# Patient Record
Sex: Female | Born: 1945 | Race: Black or African American | Hispanic: No | Smoking: Never smoker
Health system: Southern US, Community
[De-identification: ages and names within clinical notes are randomized; demographics above are authoritative.]

## PROBLEM LIST (undated history)

## (undated) DIAGNOSIS — Z9109 Other allergy status, other than to drugs and biological substances: Secondary | ICD-10-CM

## (undated) DIAGNOSIS — I219 Acute myocardial infarction, unspecified: Secondary | ICD-10-CM

## (undated) DIAGNOSIS — I493 Ventricular premature depolarization: Secondary | ICD-10-CM

## (undated) DIAGNOSIS — E7800 Pure hypercholesterolemia, unspecified: Secondary | ICD-10-CM

## (undated) DIAGNOSIS — J302 Other seasonal allergic rhinitis: Secondary | ICD-10-CM

## (undated) DIAGNOSIS — H409 Unspecified glaucoma: Secondary | ICD-10-CM

## (undated) DIAGNOSIS — I272 Pulmonary hypertension, unspecified: Secondary | ICD-10-CM

## (undated) DIAGNOSIS — C55 Malignant neoplasm of uterus, part unspecified: Secondary | ICD-10-CM

## (undated) DIAGNOSIS — R9430 Abnormal result of cardiovascular function study, unspecified: Secondary | ICD-10-CM

## (undated) DIAGNOSIS — J449 Chronic obstructive pulmonary disease, unspecified: Secondary | ICD-10-CM

## (undated) DIAGNOSIS — IMO0002 Reserved for concepts with insufficient information to code with codable children: Secondary | ICD-10-CM

## (undated) DIAGNOSIS — I10 Essential (primary) hypertension: Secondary | ICD-10-CM

## (undated) DIAGNOSIS — I639 Cerebral infarction, unspecified: Secondary | ICD-10-CM

## (undated) DIAGNOSIS — I5181 Takotsubo syndrome: Secondary | ICD-10-CM

## (undated) DIAGNOSIS — T7840XA Allergy, unspecified, initial encounter: Secondary | ICD-10-CM

## (undated) DIAGNOSIS — K219 Gastro-esophageal reflux disease without esophagitis: Secondary | ICD-10-CM

## (undated) DIAGNOSIS — H269 Unspecified cataract: Secondary | ICD-10-CM

## (undated) HISTORY — DX: Allergy, unspecified, initial encounter: T78.40XA

## (undated) HISTORY — DX: Unspecified cataract: H26.9

## (undated) HISTORY — DX: Abnormal result of cardiovascular function study, unspecified: R94.30

## (undated) HISTORY — DX: Unspecified glaucoma: H40.9

## (undated) HISTORY — DX: Chronic obstructive pulmonary disease, unspecified: J44.9

## (undated) HISTORY — DX: Reserved for concepts with insufficient information to code with codable children: IMO0002

---

## 1997-08-04 HISTORY — PX: ABDOMINAL HYSTERECTOMY: SHX81

## 1997-12-13 ENCOUNTER — Other Ambulatory Visit: Admission: RE | Admit: 1997-12-13 | Discharge: 1997-12-13 | Payer: Self-pay | Admitting: Obstetrics and Gynecology

## 1998-01-23 ENCOUNTER — Other Ambulatory Visit: Admission: RE | Admit: 1998-01-23 | Discharge: 1998-01-23 | Payer: Self-pay | Admitting: Obstetrics and Gynecology

## 1998-02-26 ENCOUNTER — Other Ambulatory Visit: Admission: RE | Admit: 1998-02-26 | Discharge: 1998-02-26 | Payer: Self-pay | Admitting: Obstetrics and Gynecology

## 1998-06-08 ENCOUNTER — Other Ambulatory Visit: Admission: RE | Admit: 1998-06-08 | Discharge: 1998-06-08 | Payer: Self-pay | Admitting: Obstetrics and Gynecology

## 1998-07-10 ENCOUNTER — Inpatient Hospital Stay (HOSPITAL_COMMUNITY): Admission: RE | Admit: 1998-07-10 | Discharge: 1998-07-13 | Payer: Self-pay | Admitting: Obstetrics and Gynecology

## 1998-11-13 ENCOUNTER — Other Ambulatory Visit: Admission: RE | Admit: 1998-11-13 | Discharge: 1998-11-13 | Payer: Self-pay | Admitting: Obstetrics and Gynecology

## 1999-02-07 ENCOUNTER — Other Ambulatory Visit: Admission: RE | Admit: 1999-02-07 | Discharge: 1999-02-07 | Payer: Self-pay | Admitting: Obstetrics and Gynecology

## 1999-05-07 ENCOUNTER — Other Ambulatory Visit: Admission: RE | Admit: 1999-05-07 | Discharge: 1999-05-07 | Payer: Self-pay | Admitting: Obstetrics and Gynecology

## 1999-08-13 ENCOUNTER — Other Ambulatory Visit: Admission: RE | Admit: 1999-08-13 | Discharge: 1999-08-13 | Payer: Self-pay | Admitting: Obstetrics and Gynecology

## 2000-01-06 ENCOUNTER — Other Ambulatory Visit: Admission: RE | Admit: 2000-01-06 | Discharge: 2000-01-06 | Payer: Self-pay | Admitting: Obstetrics and Gynecology

## 2000-07-08 ENCOUNTER — Other Ambulatory Visit: Admission: RE | Admit: 2000-07-08 | Discharge: 2000-07-08 | Payer: Self-pay | Admitting: Obstetrics and Gynecology

## 2001-02-01 ENCOUNTER — Other Ambulatory Visit: Admission: RE | Admit: 2001-02-01 | Discharge: 2001-02-01 | Payer: Self-pay | Admitting: Obstetrics and Gynecology

## 2001-08-06 ENCOUNTER — Other Ambulatory Visit: Admission: RE | Admit: 2001-08-06 | Discharge: 2001-08-06 | Payer: Self-pay | Admitting: Obstetrics and Gynecology

## 2002-01-18 ENCOUNTER — Other Ambulatory Visit: Admission: RE | Admit: 2002-01-18 | Discharge: 2002-01-18 | Payer: Self-pay | Admitting: Obstetrics and Gynecology

## 2002-08-18 ENCOUNTER — Other Ambulatory Visit: Admission: RE | Admit: 2002-08-18 | Discharge: 2002-08-18 | Payer: Self-pay | Admitting: Obstetrics and Gynecology

## 2003-10-16 ENCOUNTER — Other Ambulatory Visit: Admission: RE | Admit: 2003-10-16 | Discharge: 2003-10-16 | Payer: Self-pay | Admitting: Obstetrics and Gynecology

## 2004-04-05 ENCOUNTER — Ambulatory Visit: Payer: Self-pay | Admitting: Internal Medicine

## 2004-04-15 ENCOUNTER — Ambulatory Visit: Payer: Self-pay | Admitting: Family Medicine

## 2004-05-03 ENCOUNTER — Ambulatory Visit: Payer: Self-pay | Admitting: *Deleted

## 2004-05-28 ENCOUNTER — Ambulatory Visit: Payer: Self-pay | Admitting: Family Medicine

## 2004-09-27 ENCOUNTER — Ambulatory Visit: Payer: Self-pay | Admitting: Family Medicine

## 2004-10-01 ENCOUNTER — Ambulatory Visit (HOSPITAL_COMMUNITY): Admission: RE | Admit: 2004-10-01 | Discharge: 2004-10-01 | Payer: Self-pay | Admitting: Internal Medicine

## 2004-10-28 ENCOUNTER — Ambulatory Visit: Payer: Self-pay | Admitting: Internal Medicine

## 2005-03-10 ENCOUNTER — Ambulatory Visit: Payer: Self-pay | Admitting: Family Medicine

## 2005-04-09 ENCOUNTER — Ambulatory Visit: Payer: Self-pay | Admitting: Internal Medicine

## 2005-04-21 ENCOUNTER — Ambulatory Visit: Payer: Self-pay | Admitting: Family Medicine

## 2005-10-22 ENCOUNTER — Ambulatory Visit: Payer: Self-pay | Admitting: Family Medicine

## 2006-03-18 ENCOUNTER — Ambulatory Visit: Payer: Self-pay | Admitting: Family Medicine

## 2006-08-26 ENCOUNTER — Ambulatory Visit: Payer: Self-pay | Admitting: Family Medicine

## 2006-09-09 ENCOUNTER — Ambulatory Visit: Payer: Self-pay | Admitting: Family Medicine

## 2006-10-13 ENCOUNTER — Ambulatory Visit: Payer: Self-pay | Admitting: Family Medicine

## 2006-12-09 ENCOUNTER — Ambulatory Visit: Payer: Self-pay | Admitting: Family Medicine

## 2007-02-15 ENCOUNTER — Ambulatory Visit: Payer: Self-pay | Admitting: Family Medicine

## 2007-04-21 ENCOUNTER — Encounter (INDEPENDENT_AMBULATORY_CARE_PROVIDER_SITE_OTHER): Payer: Self-pay | Admitting: *Deleted

## 2007-07-27 ENCOUNTER — Ambulatory Visit: Payer: Self-pay | Admitting: Family Medicine

## 2007-07-27 ENCOUNTER — Encounter (INDEPENDENT_AMBULATORY_CARE_PROVIDER_SITE_OTHER): Payer: Self-pay | Admitting: Family Medicine

## 2007-07-27 LAB — CONVERTED CEMR LAB
Helicobacter Pylori Antibody-IgG: 2 — ABNORMAL HIGH
TSH: 3.664 microintl units/mL (ref 0.350–5.50)

## 2007-11-10 ENCOUNTER — Ambulatory Visit: Payer: Self-pay | Admitting: Internal Medicine

## 2007-12-14 ENCOUNTER — Emergency Department (HOSPITAL_COMMUNITY): Admission: EM | Admit: 2007-12-14 | Discharge: 2007-12-14 | Payer: Self-pay | Admitting: Emergency Medicine

## 2007-12-31 ENCOUNTER — Ambulatory Visit: Payer: Self-pay | Admitting: Internal Medicine

## 2008-01-02 ENCOUNTER — Emergency Department (HOSPITAL_COMMUNITY): Admission: EM | Admit: 2008-01-02 | Discharge: 2008-01-02 | Payer: Self-pay | Admitting: Emergency Medicine

## 2008-01-06 ENCOUNTER — Ambulatory Visit (HOSPITAL_COMMUNITY): Admission: RE | Admit: 2008-01-06 | Discharge: 2008-01-06 | Payer: Self-pay | Admitting: Family Medicine

## 2008-02-07 ENCOUNTER — Ambulatory Visit (HOSPITAL_COMMUNITY): Admission: RE | Admit: 2008-02-07 | Discharge: 2008-02-07 | Payer: Self-pay | Admitting: Gastroenterology

## 2008-03-06 ENCOUNTER — Ambulatory Visit: Payer: Self-pay | Admitting: Internal Medicine

## 2008-03-13 ENCOUNTER — Ambulatory Visit: Payer: Self-pay | Admitting: Internal Medicine

## 2008-03-13 LAB — CONVERTED CEMR LAB
Basophils Absolute: 0 10*3/uL (ref 0.0–0.1)
Basophils Relative: 1 % (ref 0–1)
Eosinophils Absolute: 0.5 10*3/uL (ref 0.0–0.7)
Eosinophils Relative: 6 % — ABNORMAL HIGH (ref 0–5)
HCT: 38.4 % (ref 36.0–46.0)
Hemoglobin: 12.5 g/dL (ref 12.0–15.0)
Lymphocytes Relative: 26 % (ref 12–46)
Lymphs Abs: 1.9 10*3/uL (ref 0.7–4.0)
MCHC: 32.6 g/dL (ref 30.0–36.0)
MCV: 89.5 fL (ref 78.0–100.0)
Monocytes Absolute: 1 10*3/uL (ref 0.1–1.0)
Monocytes Relative: 15 % — ABNORMAL HIGH (ref 3–12)
Neutro Abs: 3.7 10*3/uL (ref 1.7–7.7)
Neutrophils Relative %: 53 % (ref 43–77)
Platelets: 270 10*3/uL (ref 150–400)
RBC: 4.29 M/uL (ref 3.87–5.11)
RDW: 13.5 % (ref 11.5–15.5)
Rhuematoid fact SerPl-aCnc: <20 intl units/mL (ref 0–20)
Sed Rate: 25 mm/hr — ABNORMAL HIGH (ref 0–22)
Uric Acid, Serum: 5.2 mg/dL (ref 2.4–7.0)
WBC: 7.1 10*3/uL (ref 4.0–10.5)

## 2008-10-19 ENCOUNTER — Ambulatory Visit: Payer: Self-pay | Admitting: Internal Medicine

## 2008-12-20 IMAGING — RF DG BE W/ AIR HIGH DENSITY
15 of 18 series · 15 of 18 positions shown · IV contrast (agent unspecified)
Comparison: None

CLINICAL DATA: Change in stool.

AIR-CONTRAST BARIUM ENEMA
TECHNIQUE: After obtaining a scout radiograph air-contrast barium
enema was performed under fluoroscopy using high-density barium.
Contrast: High density barium and air

[Series 1: run · 1 of 1 slices shown (1 of 15)]
[im 1/1]
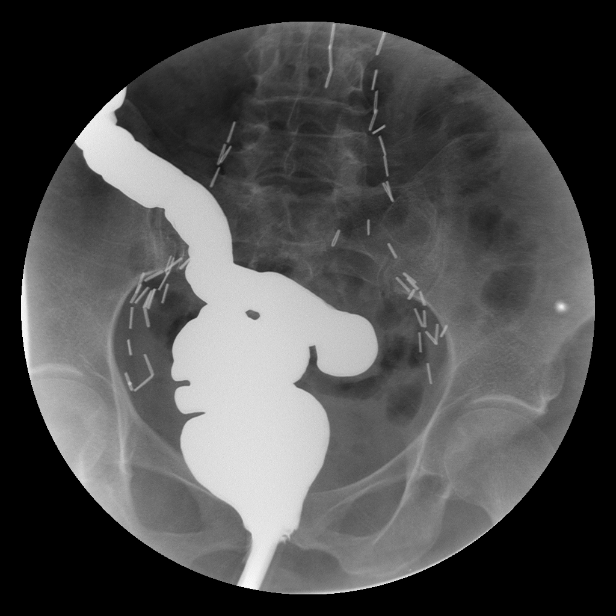

[Series 2: run · 1 of 1 slices shown (2 of 15)]
[im 1/1]
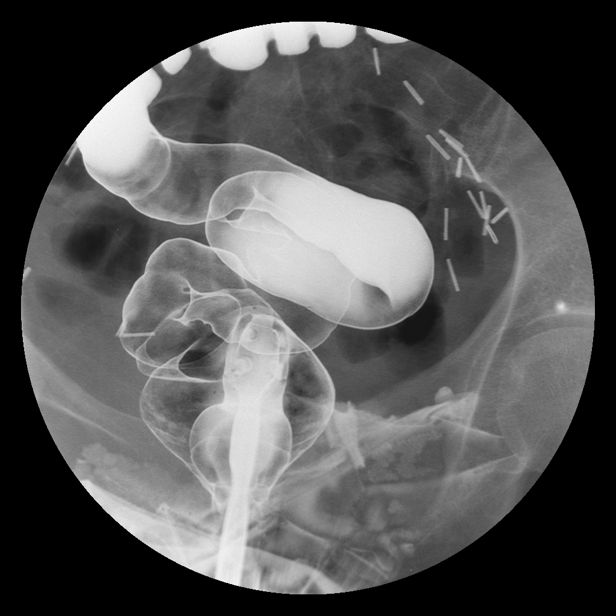

[Series 4: run · 1 of 1 slices shown (3 of 15)]
[im 1/1]
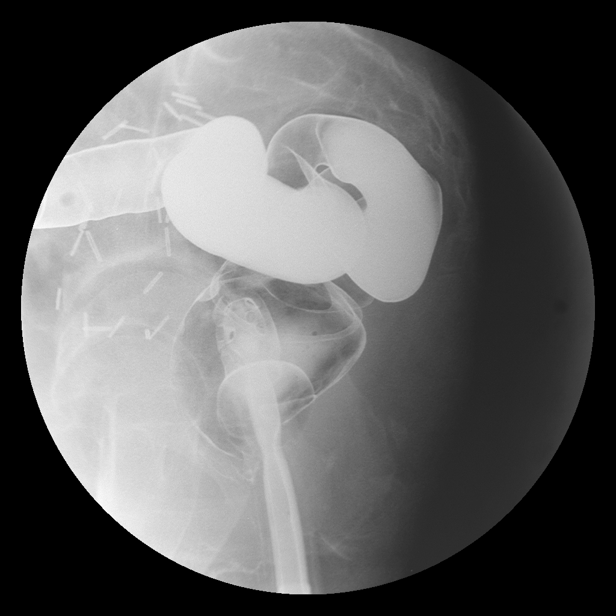

[Series 5: run · 1 of 1 slices shown (4 of 15)]
[im 1/1]
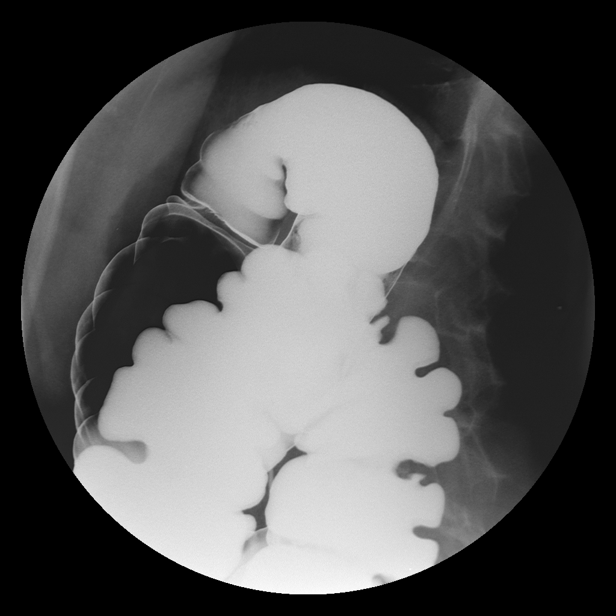

[Series 6: run · 1 of 1 slices shown (5 of 15)]
[im 1/1]
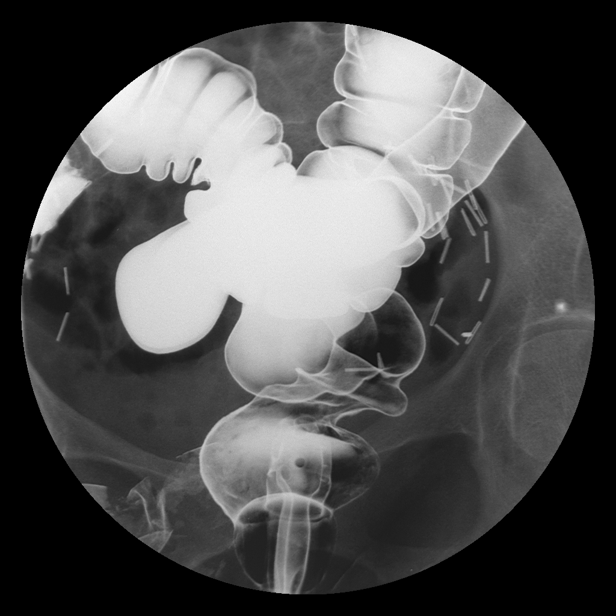

[Series 7: run · 1 of 1 slices shown (6 of 15)]
[im 1/1]
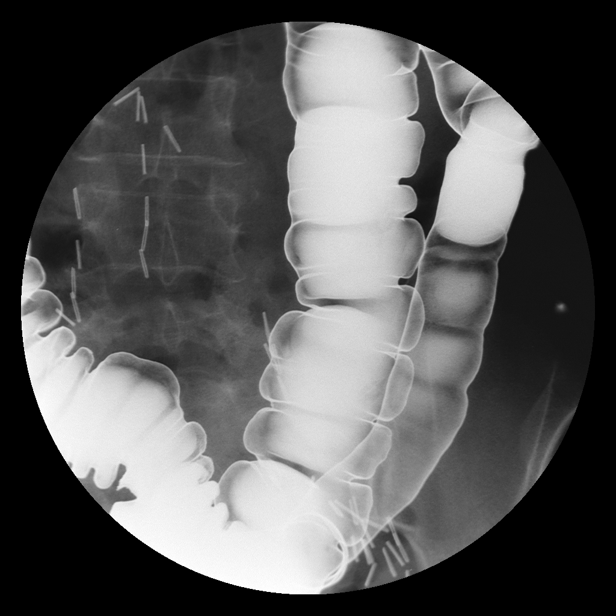

[Series 8: run · 1 of 1 slices shown (7 of 15)]
[im 1/1]
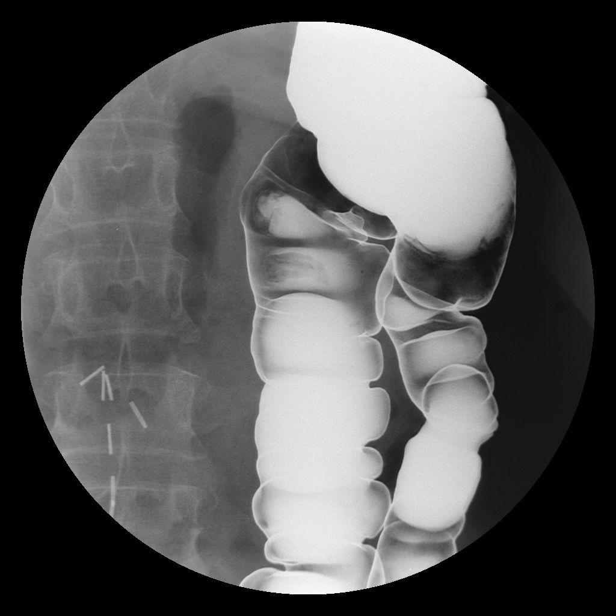

[Series 10: run · 1 of 1 slices shown (8 of 15)]
[im 1/1]
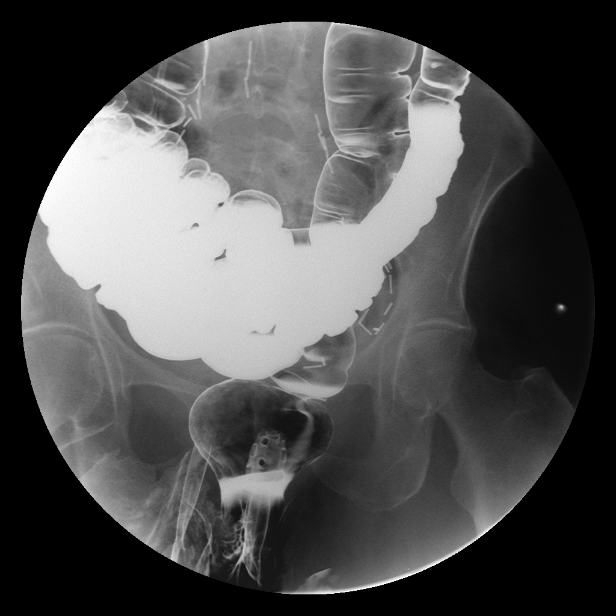

[Series 11: run · 1 of 1 slices shown (9 of 15)]
[im 1/1]
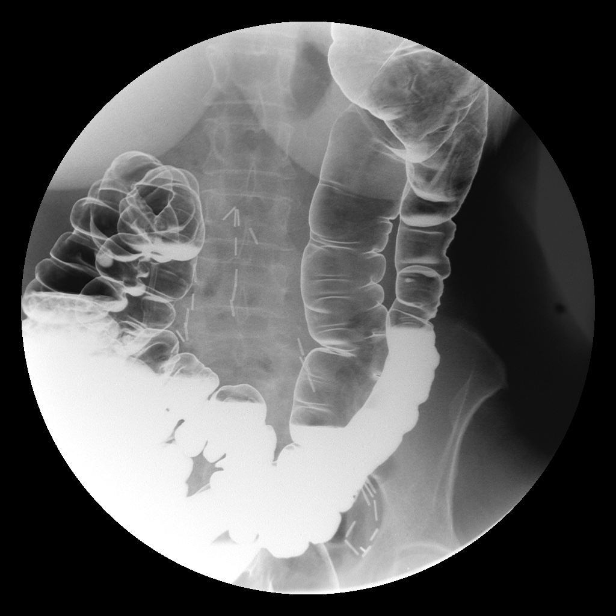

[Series 12: run · 1 of 1 slices shown (10 of 15)]
[im 1/1]
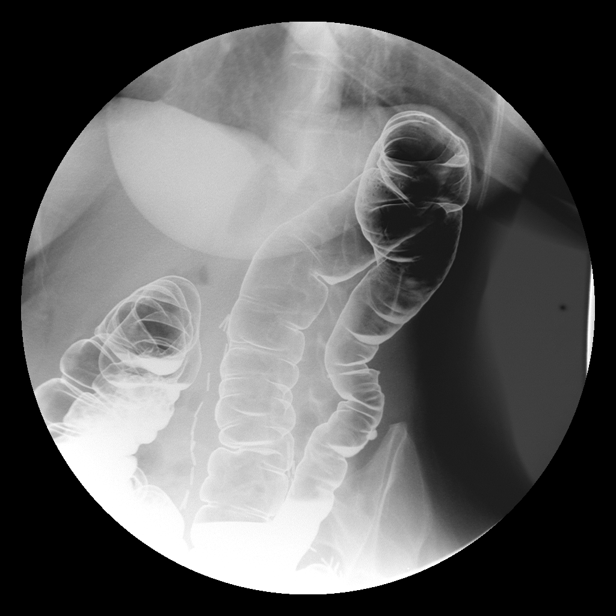

[Series 13: run · 1 of 1 slices shown (11 of 15)]
[im 1/1]
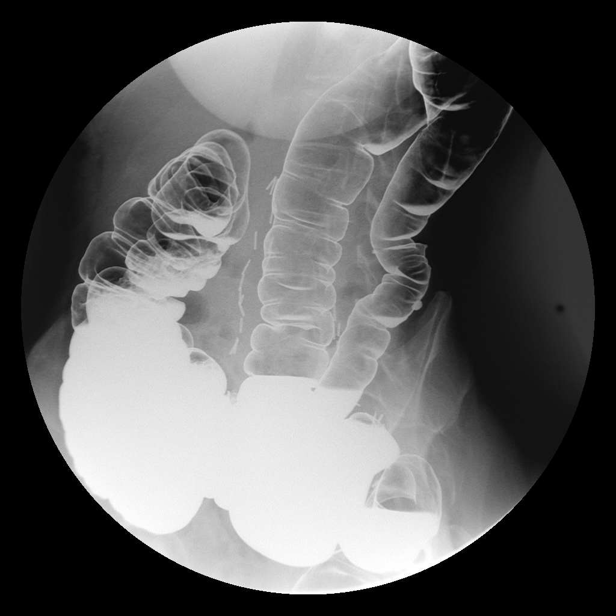

[Series 14: run · 1 of 1 slices shown (12 of 15)]
[im 1/1]
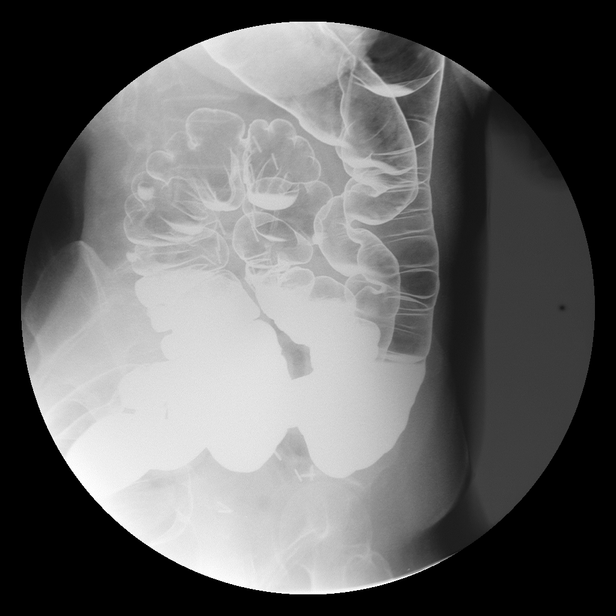

[Series 16: run · 1 of 1 slices shown (13 of 15)]
[im 1/1]
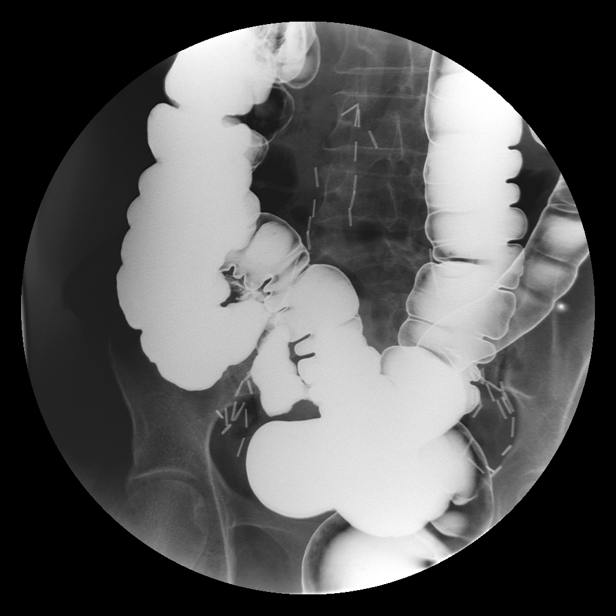

[Series 17: run · 1 of 1 slices shown (14 of 15)]
[im 1/1]
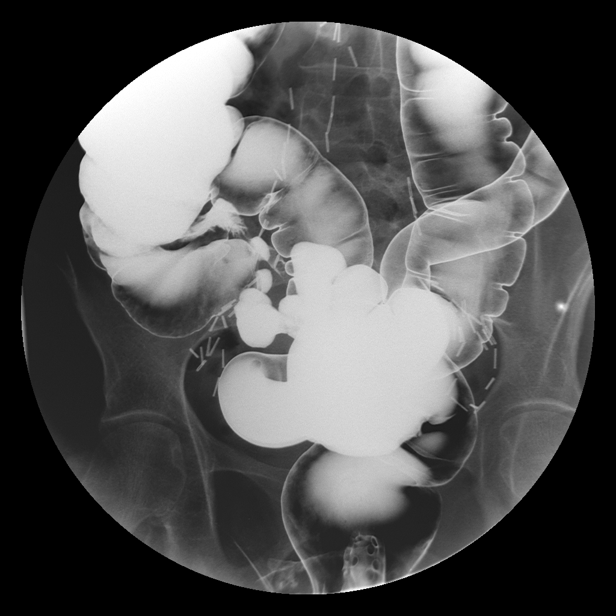

[Series 18: run · 1 of 1 slices shown (15 of 15)]
[im 1/1]
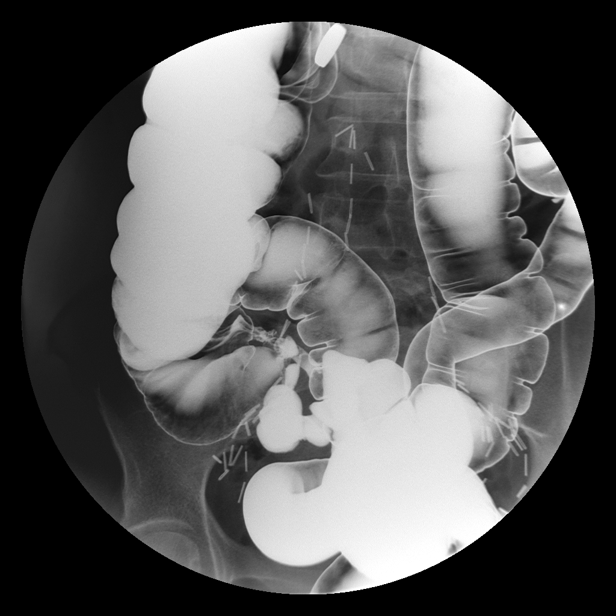

[15 of 18 positions shown; findings below may reference images not displayed]

FINDINGS: Multiple surgical staples are seen in the lower abdomen
and pelvis.

The entire colon was filled with barium and air with reflux into
the terminal ileum which appeared normal.

There is minimal diverticulosis coli with no evidence of
diverticulitis.  No colon mass, polyp, obstruction, or changes of
inflammatory bowel disease are seen.
IMPRESSION: Diverticulosis coli with no evidence of diverticulitis.

## 2009-01-15 ENCOUNTER — Ambulatory Visit: Payer: Self-pay | Admitting: Internal Medicine

## 2009-01-22 ENCOUNTER — Ambulatory Visit: Payer: Self-pay | Admitting: Internal Medicine

## 2009-03-05 ENCOUNTER — Ambulatory Visit: Payer: Self-pay | Admitting: Family Medicine

## 2009-03-28 ENCOUNTER — Encounter (INDEPENDENT_AMBULATORY_CARE_PROVIDER_SITE_OTHER): Payer: Self-pay | Admitting: Gastroenterology

## 2009-03-28 ENCOUNTER — Ambulatory Visit (HOSPITAL_COMMUNITY): Admission: RE | Admit: 2009-03-28 | Discharge: 2009-03-28 | Payer: Self-pay | Admitting: Gastroenterology

## 2009-04-06 ENCOUNTER — Ambulatory Visit: Payer: Self-pay | Admitting: Family Medicine

## 2009-05-09 ENCOUNTER — Ambulatory Visit (HOSPITAL_COMMUNITY): Admission: RE | Admit: 2009-05-09 | Discharge: 2009-05-09 | Payer: Self-pay | Admitting: Gastroenterology

## 2009-05-25 ENCOUNTER — Encounter (INDEPENDENT_AMBULATORY_CARE_PROVIDER_SITE_OTHER): Payer: Self-pay | Admitting: Adult Health

## 2009-05-25 ENCOUNTER — Ambulatory Visit: Payer: Self-pay | Admitting: Family Medicine

## 2009-06-15 ENCOUNTER — Ambulatory Visit: Payer: Self-pay | Admitting: Internal Medicine

## 2009-10-31 ENCOUNTER — Ambulatory Visit: Payer: Self-pay | Admitting: Family Medicine

## 2009-11-14 ENCOUNTER — Ambulatory Visit: Payer: Self-pay | Admitting: Internal Medicine

## 2009-11-14 ENCOUNTER — Encounter (INDEPENDENT_AMBULATORY_CARE_PROVIDER_SITE_OTHER): Payer: Self-pay | Admitting: Adult Health

## 2009-11-14 LAB — CONVERTED CEMR LAB
ALT: 13 units/L (ref 0–35)
AST: 18 units/L (ref 0–37)
Albumin: 4.2 g/dL (ref 3.5–5.2)
Alkaline Phosphatase: 65 units/L (ref 39–117)
BUN: 9 mg/dL (ref 6–23)
CO2: 28 meq/L (ref 19–32)
Calcium: 9.6 mg/dL (ref 8.4–10.5)
Chloride: 103 meq/L (ref 96–112)
Cholesterol: 171 mg/dL (ref 0–200)
Creatinine, Ser: 0.85 mg/dL (ref 0.40–1.20)
Glucose, Bld: 78 mg/dL (ref 70–99)
HDL: 47 mg/dL (ref >39)
LDL Cholesterol: 114 mg/dL — ABNORMAL HIGH (ref 0–99)
Potassium: 4 meq/L (ref 3.5–5.3)
Sodium: 138 meq/L (ref 135–145)
Total Bilirubin: 0.4 mg/dL (ref 0.3–1.2)
Total CHOL/HDL Ratio: 3.6
Total Protein: 7.5 g/dL (ref 6.0–8.3)
Triglycerides: 50 mg/dL (ref <150)
VLDL: 10 mg/dL (ref 0–40)

## 2010-01-21 ENCOUNTER — Encounter (INDEPENDENT_AMBULATORY_CARE_PROVIDER_SITE_OTHER): Payer: Self-pay | Admitting: Adult Health

## 2010-01-21 ENCOUNTER — Ambulatory Visit: Payer: Self-pay | Admitting: Internal Medicine

## 2010-01-21 LAB — CONVERTED CEMR LAB
ALT: 13 units/L (ref 0–35)
AST: 24 units/L (ref 0–37)
Albumin: 4.4 g/dL (ref 3.5–5.2)
Alkaline Phosphatase: 62 units/L (ref 39–117)
BUN: 8 mg/dL (ref 6–23)
CO2: 25 meq/L (ref 19–32)
Calcium: 10.5 mg/dL (ref 8.4–10.5)
Chloride: 98 meq/L (ref 96–112)
Cholesterol: 139 mg/dL (ref 0–200)
Creatinine, Ser: 0.92 mg/dL (ref 0.40–1.20)
Glucose, Bld: 86 mg/dL (ref 70–99)
HDL: 52 mg/dL (ref >39)
LDL Cholesterol: 79 mg/dL (ref 0–99)
Potassium: 4.1 meq/L (ref 3.5–5.3)
Sodium: 136 meq/L (ref 135–145)
Total Bilirubin: 0.6 mg/dL (ref 0.3–1.2)
Total CHOL/HDL Ratio: 2.7
Total Protein: 8.5 g/dL — ABNORMAL HIGH (ref 6.0–8.3)
Triglycerides: 42 mg/dL (ref <150)
VLDL: 8 mg/dL (ref 0–40)

## 2010-03-14 ENCOUNTER — Ambulatory Visit: Payer: Self-pay | Admitting: Internal Medicine

## 2010-05-29 ENCOUNTER — Ambulatory Visit (HOSPITAL_COMMUNITY): Admission: RE | Admit: 2010-05-29 | Discharge: 2010-05-29 | Payer: Self-pay | Admitting: Internal Medicine

## 2010-09-05 NOTE — Miscellaneous (Signed)
Summary: VIP  Patient: Deanna Brown Note: All result statuses are Final unless otherwise noted.  Tests: (1) VIP (Medications)   LLIMPORTMEDS              "Result Below..."       RESULT: ZYRTEC ALLERGY TABS 10 MG*TAKE ONE (1) TABLET EACH DAY WITH A FULL GLASS OF WATER*08/26/2006*Last Refill: [id number]*******   LLIMPORTMEDS              "Result Below..."       RESULT: PROTONIX TBEC 40 MG*TAKE ONE (1) TABLET BY MOUTH EVERY DAY*04/08/2007*Last Refill: 05/10/2007*[id number]*******   LLIMPORTMEDS              "Result Below..."       RESULT: PREDNISONE TABS 10 MG*TAKE 2 TABS THREE TIMES TODAY THEN TAKE 1 TABS THREE TIMES DAILY FOR 2 DAYS, THEN 1 TAB TWICE DAILY FOR 2 DAYS, THEN TAKE 1 TAB DAILY FOR 2 DAYS*08/26/2006*Last Refill: [email or web address]*******   LLIMPORTMEDS              "Result Below..."       RESULT: CLONIDINE HCL TABS 0.1 MG*TAKE ONE TABLET BY MOUTH EVERY NIGHT AT BEDTIME  Generic for CATAPRES 0.1MG  TAB*04/08/2007*Last Refill: 05/10/2007*[id number]*******   LLIMPORTMEDS              "Result Below..."       RESULT: BENZONATATE CAPS 100 MG*ONE CAPSULE EVERY 8 HOURS AS NEEDED FOR COUGH  Generic for TESSALON PER 100MG *12/09/2006*Last Refill: Unknown*[id number]*******   LLIMPORTMEDS              "Result Below..."       RESULT: AVALIDE TABS 300-12.5 MG*TAKE ONE (1) TABLET BY MOUTH EVERY DAY   GENE ICP AVALIDE 300/12.5 0708*04/08/2007*Last Refill: 05/10/2007*[id number]*******   LLIMPORTALLS              NKDA***  Note: An exclamation mark (!) indicates a result that was not dispersed into the flowsheet. Document Creation Date: 06/03/2007 2:58 PM _______________________________________________________________________  (1) Order result status: Final Collection or observation date-time: 04/21/2007 Requested date-time: 04/21/2007 Receipt date-time:  Reported date-time: 04/21/2007 Referring Physician:   Ordering Physician:   Specimen Source:  Source: [place] Order Number:  Lab site:

## 2010-12-17 NOTE — Op Note (Signed)
NAMEZENOLA, DEZARN             ACCOUNT NO.:  [id number]   MEDICAL RECORD NO.:  [id number]          PATIENT TYPE:  AMB   LOCATION:  ENDO                         FACILITY:  [facility]   PHYSICIAN:  Shirley Friar, MDDATE OF BIRTH:  1946/06/20   DATE OF PROCEDURE:  03/28/2009  DATE OF DISCHARGE:                               OPERATIVE REPORT   PROCEDURE:  Upper endoscopy.   INDICATION:  Dysphagia, history of esophageal stricture.   MEDICATIONS:  Versed 2 mg IV, additional medicine given for preceding  colonoscopy.   FINDINGS:  Standard 11-mm endoscope was inserted to the oropharynx and  esophagus was intubated.  In the midportion of esophagus, at 20 cm from  incisors, was a thin, benign-appearing esophageal ring noted.  On  further inspection it had an asymmetric appearance, consistent with  esophageal web.  The endoscope could not be advanced through this area  and decision was made to remove the standard endoscope and insert a  pediatric endoscope.  Prior to inserting the pediatric endoscope,  fluoroscopy was instituted to help guide the pediatric endoscope and  guidewire into the stomach.  The pediatric endoscope could not be  advanced through the lumen of the esophagus past this web either, so a  guidewire was then inserted through the working channel of the  endoscope.  Using fluoroscopic guidance, this guidewire was advanced  carefully down into the lumen of the stomach.  The endoscope was  withdrawn and in sequential fashion, 9-mm, 10-mm, 11-mm, 12-mm Savary  dilators were inserted over the guidewire in stepwise fashion.  There  was no resistance with passage of the 9-mm dilator and no heme noted,  there was minimal resistance at 10 mm without any heme noted, there was  moderate resistance at 11-mm and 12-mm Savary dilators with heme noted  on both occasions.   Pediatric endoscope was then reinserted and successful dilation of  esophageal web was achieved, allowing  passage of the endoscope down into  the lumen of the stomach.  The pediatric endoscope was then withdrawn  and the standard endoscope was inserted and advanced back down through  the lumen of the esophagus, down into the distal portion of the  esophagus into the lumen of the stomach.  Endoscope was advanced into  the distal part of the stomach into the duodenal bulb, which was normal  in appearance.  Endoscope was then advanced into the second portion of  the duodenum, which was normal in appearance.  Endoscope was withdrawn  back into the stomach.  Retroflexion was done, which revealed normal-  appearing proximal stomach with fresh blood extruding from the  esophagus, secondary to dilation.  Endoscope was withdrawn back into the  esophagus and  a benign-appearing distal esophageal ring was seen.  Decision was made to use a balloon dilator at this time to dilate just  the distal esophageal ring and this was done using the pneumatic balloon  dilation at 13.5 mm.  This size was held for 1 minute and then the  balloon was deflated and removed.  Endoscope was then withdrawn back  into the middle portion of  esophagus, distal to the previously dilated  esophageal web and there were scattered white plaque seen, concerning  for candida.  A cytologic brushing was done to obtain a sample of these  white plaques.  These white plaques were scattered in the distal third  of the esophagus.  Endoscope was then withdrawn to confirm the above  findings.   ASSESSMENT:  1. Mid-esophageal web, status post Savary dilation with fluoroscopy up      to 12 mm.  2. Distal esophageal ring, status post dilation with balloon dilation      up to 13.5 mm.  3. Scattered white plaques in esophagus, concerning for candida,      status post cytologic brushing.   PLAN:  1. Proton pump inhibitor therapy twice a day.  2. Consider repeat dilation in 4-6 weeks, depending on recurrence of      symptoms.  3. Avoid aspirin  medicines for 2 weeks.      Shirley Friar, MD  Electronically Signed     VCS/MEDQ  D:  03/28/2009  T:  03/28/2009  Job:  [id number]   cc:   Dineen Kid. Reche Dixon, M.D.  Fax: [phone]

## 2010-12-17 NOTE — Op Note (Signed)
NAMESELINA, Brown NO.:  [id number]   MEDICAL RECORD NO.:  [id number]          PATIENT TYPE:  EMS   LOCATION:  ED                           FACILITY:  [facility]   PHYSICIAN:  Shirley Friar, MDDATE OF BIRTH:  February 16, 1946   DATE OF PROCEDURE:  01/02/2008  DATE OF DISCHARGE:                               OPERATIVE REPORT   PROCEDURE:  Upper endoscopy.   INDICATIONS:  Food impaction.   HISTORY OF PRESENT ILLNESS:  A 65 year old black female who ate a piece  of steak last night; and since then, has been unable to keep her saliva  down, and has been having some chest pressure.  She received glucagon in  the emergency room without any relief.  She reports intermittent  episodes of food hanging up in the past, but usually is able to go down  on its own.  She does give a history of reflux that is well controlled  on daily Protonix.   MEDICATIONS:  Fentanyl 50 mcg IV and Versed 4 mg IV.   FINDINGS:  Endoscope was inserted into the oropharynx and esophagus was  intubated.  In the mid part of the esophagus there was circumferential  ulcers, erythema and edema at approximately 22 cm from the incisors.  The lumen was narrowed, and mild resistance occurred which initially  prevented passage of the endoscope.  The endoscope was then able to be  passed down through the mid esophageal stricture down to the GE  junction.  In the GE junction there were scattered erosions and  ulcerations as well with no stricture identified.   Endoscope was advanced through this area down into the stomach which  revealed normal stomach mucosa.  Retroflexion revealed normal proximal  stomach.  The scope was straightened and advanced into the duodenal bulb  and the second portion of the duodenum which were both normal.  Endoscope was withdrawn back into the esophagus, and this mid esophageal  stricture was, again, evaluated.  Due to the severe ulceration and  erythema, a decision was made  not to biopsy the mucosa at this time.   ASSESSMENT:  1. 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  2. Erosive distal esophagitis.  3. No food bolus seen in the esophagus, but food bolus visualized in      the stomach lumen.   PLAN:  1. Carafate q.i.d. x2 weeks.  2. Proton pump inhibitor twice a day x8 weeks.  3. The patient will follow up in office in 2 weeks, and a decision      will be made to decide on timing for repeat upper endoscopy with      possible biopsy and dilation.  4. Clear liquid diet for 8 hours, and then soft diet x48 hours.  The      patient then can advance as tolerated.      Shirley Friar, MD  Electronically Signed     VCS/MEDQ  D:  01/02/2008  T:  01/02/2008  Job:  [id number]

## 2010-12-17 NOTE — Op Note (Signed)
Deanna Brown, Deanna Brown             ACCOUNT NO.:  [id number]   MEDICAL RECORD NO.:  [id number]          PATIENT TYPE:  AMB   LOCATION:  ENDO                         FACILITY:  [facility]   PHYSICIAN:  Shirley Friar, MDDATE OF BIRTH:  05/14/1946   DATE OF PROCEDURE:  03/28/2009  DATE OF DISCHARGE:                               OPERATIVE REPORT   PROCEDURE PERFORMED:  Colonoscopy.   INDICATIONS:  Screening.   MEDICATIONS:  Fentanyl 100 mcg IV, Versed 10 mg IV.   FINDINGS:  Rectal exam was normal.  A pediatric colonoscope was inserted  into a well prepped colon.  The colon was very tortuous in the sigmoid  colon.  The patient had to be turned in supine position in order to  advance proximal to the sigmoid colon.  After advancing proximally to  the sigmoid colon.  The patient was turned back in the left lateral  decubitus position.  The colonoscope was advanced to the ascending colon  and abdominal pressure had to be applied in order to reach the cecum.  Once reaching the cecum, the appendiceal orifice was identified as was  the ileocecal valve.  On careful withdrawal of the colonoscope no  mucosal abnormalities were seen.  Retroflexion revealed small internal  hemorrhoids.   ASSESSMENT:  Small internal hemorrhoids, otherwise normal colonoscopy.   PLAN:  Repeat colonoscopy in 10 years.      Shirley Friar, MD  Electronically Signed     VCS/MEDQ  D:  03/28/2009  T:  03/28/2009  Job:  [id number]   cc:   Dineen Kid. Reche Dixon, M.D.  Fax: [phone]

## 2010-12-17 NOTE — Op Note (Signed)
Deanna Brown, Deanna Brown             ACCOUNT NO.:  [id number]   MEDICAL RECORD NO.:  [id number]          PATIENT TYPE:  AMB   LOCATION:  ENDO                         FACILITY:  [facility]   PHYSICIAN:  Shirley Friar, MDDATE OF BIRTH:  1946/07/23   DATE OF PROCEDURE:  DATE OF DISCHARGE:                               OPERATIVE REPORT   INDICATIONS:  Esophageal stricture, dysphagia.   MEDICATIONS:  Fentanyl 50 mcg IV, Versed 7.5 mg IV, Cetacaine spray.   FINDINGS:  Endoscope was inserted through the oropharynx and esophagus  was intubated.  In the mid esophagus was a benign-appearing stricture  that was circumferential and a 9.8-mm endoscope caused minimal dilation  of this stricture with oozing of blood and superficial tear in the  mucosa noted from the endoscope.  The endoscope was passed down to the  distal esophagus where a circumferential ring was seen and the endoscope  was easily advanced through this area into the stomach.  Stomach was  normal in its entirety.  Endoscope was advanced down to the duodenal  bulb and second portion of the duodenum which was normal.  The endoscope  was withdrawn back into the stomach and esophagus was again evaluated  and then endoscope was advanced down to the stomach and a spring-tip  guidewire was inserted through the working channel of the endoscope into  the lumen of the stomach with the use of fluoroscopy.  The endoscope was  then withdrawn keeping the wire in place and this was confirmed with  fluoroscopy.  A 10-mm, 11-mm, and 12-mm Savary dilator was inserted over  the wire in sequence with evidence of the advancement distal to the GE  junction noted on fluoroscopy.  Minimal heme was seen on each dilator  with minimal-to-mild resistance.  The wire and 12-mm dilator were then  withdrawn in its entirety.   ASSESSMENT:  Mid esophageal stricture in distal esophageal ring status  post Savary dilator up to 12 mm with the use of fluoroscopy.   PLAN:  1. Continue Protonix twice a day.  2. Liquid diet and advance as tolerated.  3. Return to the office in 1 month.  4. Consider repeat dilation depending on recurrence of symptoms.      Shirley Friar, MD  Electronically Signed     VCS/MEDQ  D:  02/07/2008  T:  02/08/2008  Job:  [id number]

## 2011-04-30 ENCOUNTER — Inpatient Hospital Stay (INDEPENDENT_AMBULATORY_CARE_PROVIDER_SITE_OTHER)
Admission: RE | Admit: 2011-04-30 | Discharge: 2011-04-30 | Disposition: A | Discharge status: Home / Self Care | Payer: Self-pay | Source: Ambulatory Visit | Attending: Family Medicine | Admitting: Family Medicine

## 2011-04-30 DIAGNOSIS — B354 Tinea corporis: Secondary | ICD-10-CM

## 2011-04-30 DIAGNOSIS — T6391XA Toxic effect of contact with unspecified venomous animal, accidental (unintentional), initial encounter: Secondary | ICD-10-CM

## 2011-04-30 LAB — POCT I-STAT, CHEM 8
BUN: 9
Calcium, Ion: 1.24
Chloride: 102
Creatinine, Ser: 1.1
Glucose, Bld: 110 — ABNORMAL HIGH
HCT: 45
Hemoglobin: 15.3 — ABNORMAL HIGH
Potassium: 3.6
Sodium: 141
TCO2: 29

## 2011-06-28 ENCOUNTER — Encounter: Payer: Self-pay | Admitting: *Deleted

## 2011-06-28 ENCOUNTER — Emergency Department (INDEPENDENT_AMBULATORY_CARE_PROVIDER_SITE_OTHER)
Admission: EM | Admit: 2011-06-28 | Discharge: 2011-06-28 | Disposition: A | Discharge status: Home / Self Care | Payer: Medicare Other | Source: Home / Self Care | Attending: Family Medicine | Admitting: Family Medicine

## 2011-06-28 ENCOUNTER — Emergency Department (INDEPENDENT_AMBULATORY_CARE_PROVIDER_SITE_OTHER): Payer: Medicare Other

## 2011-06-28 DIAGNOSIS — M76899 Other specified enthesopathies of unspecified lower limb, excluding foot: Secondary | ICD-10-CM

## 2011-06-28 DIAGNOSIS — M7071 Other bursitis of hip, right hip: Secondary | ICD-10-CM

## 2011-06-28 HISTORY — DX: Pure hypercholesterolemia, unspecified: E78.00

## 2011-06-28 HISTORY — DX: Essential (primary) hypertension: I10

## 2011-06-28 HISTORY — DX: Other seasonal allergic rhinitis: J30.2

## 2011-06-28 MED ORDER — MELOXICAM 7.5 MG PO TABS: 7.5000 mg | ORAL_TABLET | Freq: Two times a day (BID) | ORAL | Status: AC

## 2011-06-28 NOTE — ED Provider Notes (Signed)
History     CSN: [id number] Arrival date & time: 06/28/2011 10:36 AM   First MD Initiated Contact with Patient 06/28/11 [phone]      Chief Complaint  Patient presents with  . Nasal Congestion    onset of hip pain/groin pain x 2 days no known injury  . Hip Pain    (Consider location/radiation/quality/duration/timing/severity/associated sxs/prior treatment) Patient is a 65 y.o. female presenting with hip pain. The history is provided by the patient.  Hip Pain This is a new problem. The current episode started more than 2 days ago. The problem occurs constantly. The problem has not changed since onset.The symptoms are aggravated by walking. The treatment provided no relief.    Past Medical History  Diagnosis Date  . Hypertension   . Asthma   . Seasonal allergies   . High cholesterol     Past Surgical History  Procedure Date  . Abdominal hysterectomy     History reviewed. No pertinent family history.  History  Substance Use Topics  . Smoking status: Never Smoker   . Smokeless tobacco: Not on file  . Alcohol Use: No    OB History    Grav Para Term Preterm Abortions TAB SAB Ect Mult Living                  Review of Systems  Constitutional: Negative.   Gastrointestinal: Negative.   Genitourinary: Negative.   Musculoskeletal: Positive for gait problem.    Allergies  Xyzal  Home Medications  No current outpatient prescriptions on file.  BP 170/96  Pulse 84  Temp(Src) 97 F (36.1 C) (Oral)  Resp 17  SpO2 97%  Physical Exam  Constitutional: She is oriented to person, place, and time. She appears well-developed and well-nourished.  Abdominal: Soft. Bowel sounds are normal. She exhibits no distension and no mass. There is no tenderness. There is no rebound and no guarding.  Musculoskeletal:       Right hip: She exhibits decreased range of motion, tenderness and bony tenderness.  Neurological: She is alert and oriented to person, place, and time.    ED  Course  Procedures (including critical care time)  Labs Reviewed - No data to display No results found.   No diagnosis found.    MDM  X-rays reviewed and report per radiologist.         Barkley Bruns, MD 06/28/11 1110

## 2011-08-11 ENCOUNTER — Emergency Department (INDEPENDENT_AMBULATORY_CARE_PROVIDER_SITE_OTHER)
Admission: EM | Admit: 2011-08-11 | Discharge: 2011-08-11 | Disposition: A | Discharge status: Home / Self Care | Payer: Medicare Other | Source: Home / Self Care | Attending: Emergency Medicine | Admitting: Emergency Medicine

## 2011-08-11 ENCOUNTER — Encounter (HOSPITAL_COMMUNITY): Payer: Self-pay

## 2011-08-11 DIAGNOSIS — R059 Cough, unspecified: Secondary | ICD-10-CM

## 2011-08-11 DIAGNOSIS — R05 Cough: Secondary | ICD-10-CM

## 2011-08-11 MED ORDER — GUAIFENESIN-CODEINE 100-10 MG/5ML PO SYRP: 5.0000 mL | ORAL_SOLUTION | Freq: Three times a day (TID) | ORAL | Status: AC | PRN

## 2011-08-11 MED ORDER — PREDNISONE 20 MG PO TABS: 40.0000 mg | ORAL_TABLET | Freq: Every day | ORAL | Status: AC

## 2011-08-11 NOTE — ED Provider Notes (Addendum)
History     CSN: [id number]  Arrival date & time 08/11/11  1032   First MD Initiated Contact with Patient 08/11/11 1043      Chief Complaint  Patient presents with  . Cough    (Consider location/radiation/quality/duration/timing/severity/associated sxs/prior treatment) HPI Comments: Coughing for almost 2 weeks, feel congested in my chest, no fevers, No sob, SOME throat discomfort  Patient is a 66 y.o. female presenting with cough. The history is provided by the patient.  Cough This is a new problem. The problem has not changed since onset.The cough is non-productive. There has been no fever. Associated symptoms include rhinorrhea and sore throat. Pertinent negatives include no chest pain, no chills, no sweats, no ear congestion, no myalgias, no shortness of breath and no wheezing. She has tried nothing for the symptoms. Her past medical history is significant for asthma. Her past medical history does not include pneumonia.    Past Medical History  Diagnosis Date  . Hypertension   . Asthma   . Seasonal allergies   . High cholesterol     Past Surgical History  Procedure Date  . Abdominal hysterectomy     History reviewed. No pertinent family history.  History  Substance Use Topics  . Smoking status: Never Smoker   . Smokeless tobacco: Not on file  . Alcohol Use: No    OB History    Grav Para Term Preterm Abortions TAB SAB Ect Mult Living                  Review of Systems  Constitutional: Negative for fever, chills and activity change.  HENT: Positive for congestion, sore throat and rhinorrhea. Negative for postnasal drip.   Respiratory: Positive for cough. Negative for shortness of breath and wheezing.   Cardiovascular: Negative for chest pain.  Musculoskeletal: Negative for myalgias.  Skin: Negative for rash.    Allergies  Xyzal  Home Medications   Current Outpatient Rx  Name Route Sig Dispense Refill  . ATORVASTATIN CALCIUM 10 MG PO TABS Oral Take 10  mg by mouth daily.      Marland Kitchen CLONIDINE HCL 0.1 MG PO TABS Oral Take 0.1 mg by mouth 2 (two) times daily.      Marland Kitchen FLUTICASONE FUROATE 27.5 MCG/SPRAY NA SUSP Nasal Place 2 sprays into the nose daily.      Marland Kitchen FLUTICASONE-SALMETEROL 115-21 MCG/ACT IN AERO Inhalation Inhale 2 puffs into the lungs 2 (two) times daily.      . GUAIFENESIN-CODEINE 100-10 MG/5ML PO SYRP Oral Take 5 mLs by mouth 3 (three) times daily as needed for cough. 120 mL 0  . LORATADINE 10 MG PO TABS Oral Take 10 mg by mouth daily.      . MELOXICAM 7.5 MG PO TABS Oral Take 1 tablet (7.5 mg total) by mouth 2 (two) times daily. 30 tablet 1  . CENTRUM PO CHEW Oral Chew 1 tablet by mouth daily.      Marland Kitchen PANTOPRAZOLE SODIUM 20 MG PO TBEC Oral Take 20 mg by mouth daily.      Marland Kitchen PREDNISONE 20 MG PO TABS Oral Take 2 tablets (40 mg total) by mouth daily. 10 tablet 0  . VALSARTAN-HYDROCHLOROTHIAZIDE 160-25 MG PO TABS Oral Take 1 tablet by mouth daily.        BP 168/84  Pulse 80  Temp(Src) 97.8 F (36.6 C) (Oral)  Resp 20  SpO2 98%  Physical Exam  Nursing note and vitals reviewed. Constitutional: She is oriented to person, place,  and time. She appears well-developed and well-nourished. No distress.  HENT:  Head: Normocephalic.  Eyes: Conjunctivae are normal. Right eye exhibits no discharge. Left eye exhibits no discharge.  Neck: Normal range of motion. Neck supple. No JVD present.  Cardiovascular: Normal rate.   Pulmonary/Chest: Effort normal and breath sounds normal. No respiratory distress. She has no wheezes. She has no rales. She exhibits no tenderness.  Abdominal: Soft. There is no tenderness. There is no rebound and no guarding.  Musculoskeletal: She exhibits no edema.  Lymphadenopathy:    She has no cervical adenopathy.  Neurological: She is oriented to person, place, and time.  Skin: Skin is dry.    ED Course  Procedures (including critical care time)  Labs Reviewed - No data to display No results found.   1. Cough        MDM  Cough x 2 weeks        Jimmie Molly, MD 08/11/11 1302  Jimmie Molly, MD 08/11/11 1302

## 2011-08-11 NOTE — ED Notes (Signed)
C/o 2 week duration of cough w congestion ; minimal relief w DM-Tussin

## 2011-09-01 ENCOUNTER — Other Ambulatory Visit (HOSPITAL_COMMUNITY): Payer: Self-pay | Admitting: Family Medicine

## 2011-09-01 ENCOUNTER — Other Ambulatory Visit (HOSPITAL_COMMUNITY)
Admission: RE | Admit: 2011-09-01 | Discharge: 2011-09-01 | Disposition: A | Discharge status: Home / Self Care | Payer: Medicare Other | Source: Ambulatory Visit | Attending: Family Medicine | Admitting: Family Medicine

## 2011-09-01 ENCOUNTER — Other Ambulatory Visit: Payer: Self-pay | Admitting: Family Medicine

## 2011-09-01 DIAGNOSIS — Z780 Asymptomatic menopausal state: Secondary | ICD-10-CM

## 2011-09-01 DIAGNOSIS — Z124 Encounter for screening for malignant neoplasm of cervix: Secondary | ICD-10-CM | POA: Insufficient documentation

## 2011-09-09 ENCOUNTER — Ambulatory Visit (HOSPITAL_COMMUNITY)
Admission: RE | Admit: 2011-09-09 | Discharge: 2011-09-09 | Disposition: A | Discharge status: Home / Self Care | Payer: Medicare Other | Source: Ambulatory Visit | Attending: Family Medicine | Admitting: Family Medicine

## 2011-09-09 DIAGNOSIS — Z780 Asymptomatic menopausal state: Secondary | ICD-10-CM | POA: Insufficient documentation

## 2011-09-09 DIAGNOSIS — Z13820 Encounter for screening for osteoporosis: Secondary | ICD-10-CM | POA: Insufficient documentation

## 2012-04-13 ENCOUNTER — Other Ambulatory Visit (HOSPITAL_COMMUNITY): Payer: Self-pay | Admitting: Family Medicine

## 2012-04-13 DIAGNOSIS — J45909 Unspecified asthma, uncomplicated: Secondary | ICD-10-CM

## 2012-04-21 ENCOUNTER — Ambulatory Visit (HOSPITAL_COMMUNITY)
Admission: RE | Admit: 2012-04-21 | Discharge: 2012-04-21 | Disposition: A | Discharge status: Home / Self Care | Payer: Medicare Other | Source: Ambulatory Visit | Attending: Family Medicine | Admitting: Family Medicine

## 2012-04-21 DIAGNOSIS — J45909 Unspecified asthma, uncomplicated: Secondary | ICD-10-CM | POA: Insufficient documentation

## 2012-04-21 MED ORDER — ALBUTEROL SULFATE (5 MG/ML) 0.5% IN NEBU
2.5000 mg | INHALATION_SOLUTION | Freq: Once | RESPIRATORY_TRACT | Status: AC
  Administered 2012-04-21: 2.5 mg via RESPIRATORY_TRACT

## 2012-08-12 ENCOUNTER — Emergency Department (INDEPENDENT_AMBULATORY_CARE_PROVIDER_SITE_OTHER): Payer: Medicare Other

## 2012-08-12 ENCOUNTER — Emergency Department (HOSPITAL_COMMUNITY)
Admission: EM | Admit: 2012-08-12 | Discharge: 2012-08-12 | Disposition: A | Discharge status: Home / Self Care | Payer: Medicare Other | Source: Home / Self Care | Attending: Emergency Medicine | Admitting: Emergency Medicine

## 2012-08-12 ENCOUNTER — Encounter (HOSPITAL_COMMUNITY): Payer: Self-pay

## 2012-08-12 DIAGNOSIS — R05 Cough: Secondary | ICD-10-CM

## 2012-08-12 DIAGNOSIS — R059 Cough, unspecified: Secondary | ICD-10-CM

## 2012-08-12 MED ORDER — PREDNISONE 10 MG PO TABS: ORAL_TABLET | ORAL | Status: DC

## 2012-08-12 MED ORDER — BENZONATATE 200 MG PO CAPS: 200.0000 mg | ORAL_CAPSULE | Freq: Three times a day (TID) | ORAL | Status: DC | PRN

## 2012-08-12 NOTE — ED Notes (Signed)
URI type syx for past 3-4 days, no better w OTC medications

## 2012-08-12 NOTE — ED Provider Notes (Signed)
Chief Complaint  Patient presents with  . URI    History of Present Illness:   Deanna Brown is a 67 year old female who has had a three-month history of a chronic cough which is mostly nonproductive but sometimes productive of small amounts of clear sputum. She has had some wheezing and slight nasal congestion and rhinorrhea. The coughing is worse in the morning when she first gets up. She has a history of asthma, allergies, and gastroesophageal reflux. She denies any fevers, chills, anorexia, weight loss. She's had no headache, sinus pressure, sore throat, or postnasal drip. She denies any chest pain or history of cardiac problems. She denies any reflux symptoms, indigestion, heartburn, waterbrash, dysphagia, nausea, or vomiting.  Review of Systems:  Other than noted above, the patient denies any of the following symptoms. Systemic:  No fever, chills, sweats, fatigue, myalgias, headache, or anorexia. Eye:  No redness, pain or drainage. ENT:  No earache, ear congestion, nasal congestion, sneezing, rhinorrhea, sinus pressure, sinus pain, post nasal drip, or sore throat. Lungs:  No cough, sputum production, wheezing, shortness of breath, or chest pain. GI:  No abdominal pain, nausea, vomiting, or diarrhea.  PMFSH:  Past medical history, family history, social history, meds, and allergies were reviewed.  Physical Exam:   Vital signs:  BP 128/76  Pulse 74  Temp 97.8 F (36.6 C) (Oral)  Resp 16  SpO2 97% General:  Alert, in no distress. Eye:  No conjunctival injection or drainage. Lids were normal. ENT:  TMs and canals were normal, without erythema or inflammation.  Nasal mucosa was clear and uncongested, without drainage.  Mucous membranes were moist.  Pharynx was clear, without exudate or drainage.  There were no oral ulcerations or lesions. Neck:  Supple, no adenopathy, tenderness or mass. Lungs:  No respiratory distress.  Lungs were clear to auscultation, without wheezes, rales or rhonchi.   Breath sounds were clear and equal bilaterally.  Heart:  Regular rhythm, without gallops, murmers or rubs. Skin:  Clear, warm, and dry, without rash or lesions.  Radiology:  Dg Chest 2 View  08/12/2012  *RADIOLOGY REPORT*  Clinical Data: Chronic cough  CHEST - 2 VIEW  Comparison: CT chest dated 12/14/2007  Findings: Lungs are essentially clear.  No focal consolidation. No pleural effusion or pneumothorax.  Cardiomediastinal silhouette is within normal limits.  Mild degenerative changes of the visualized thoracolumbar spine.  IMPRESSION: No evidence of acute cardiopulmonary disease.   Original Report Authenticated By: Charline Bills, M.D.    I reviewed the images independently and personally and concur with the radiologist's findings.  Assessment:  The encounter diagnosis was Cough.  Differential diagnosis of the cough includes medication side effect she does note that the cough started about the same time she started valsartan. Although losartan is not known to cause cough, it is listed as a potential side effect. Since her blood pressure is well controlled today I'm going to have her stop that and followup with her primary care physician in 2 weeks. Other possible causes would include allergies, asthma, or reflux. She wasn't wheezing today her lungs sounded clear. It's possible she could have some reactive airways disease that has persisted from a virus when this first began, 3 months ago. She was given benzonatate and prednisone. Followup with her primary care Dr. in 2 weeks. In the meantime she should continue on with her Advair, Claritin, and Protonix.  Plan:   1.  The following meds were prescribed:   New Prescriptions   BENZONATATE (TESSALON) 200  MG CAPSULE    Take 1 capsule (200 mg total) by mouth 3 (three) times daily as needed for cough.   PREDNISONE (DELTASONE) 10 MG TABLET    Take 4 tabs daily for 4 days, 3 tabs daily for 4 days, 2 tabs daily for 4 days, then 1 tab daily for 4 days.    2.  The patient was instructed in symptomatic care and handouts were given. 3.  The patient was told to return if becoming worse in any way, if no better in 3 or 4 days, and given some red flag symptoms that would indicate earlier return.   Reuben Likes, MD 08/12/12 [phone]

## 2012-08-14 ENCOUNTER — Telehealth (HOSPITAL_COMMUNITY): Payer: Self-pay | Admitting: Emergency Medicine

## 2012-08-14 NOTE — ED Notes (Signed)
Patient called stating she could not afford Tessalon.  Chart reviewed by Dr Ladon Applebaum.  Patient advised to take Robitussin or Delsym over the counter.  Patient expressed understanding and did not have any other questions

## 2013-01-10 ENCOUNTER — Inpatient Hospital Stay (HOSPITAL_COMMUNITY)
Admission: EM | Admit: 2013-01-10 | Discharge: 2013-01-14 | DRG: 281 | Disposition: A | Discharge status: Home / Self Care | Payer: Medicare Other | Attending: Cardiology | Admitting: Cardiology

## 2013-01-10 ENCOUNTER — Inpatient Hospital Stay (HOSPITAL_COMMUNITY): Payer: Medicare Other

## 2013-01-10 ENCOUNTER — Encounter (HOSPITAL_COMMUNITY): Payer: Self-pay | Admitting: Emergency Medicine

## 2013-01-10 DIAGNOSIS — I10 Essential (primary) hypertension: Secondary | ICD-10-CM | POA: Diagnosis present

## 2013-01-10 DIAGNOSIS — E785 Hyperlipidemia, unspecified: Secondary | ICD-10-CM | POA: Diagnosis present

## 2013-01-10 DIAGNOSIS — E876 Hypokalemia: Secondary | ICD-10-CM | POA: Diagnosis present

## 2013-01-10 DIAGNOSIS — E7800 Pure hypercholesterolemia, unspecified: Secondary | ICD-10-CM | POA: Diagnosis present

## 2013-01-10 DIAGNOSIS — I214 Non-ST elevation (NSTEMI) myocardial infarction: Secondary | ICD-10-CM | POA: Diagnosis present

## 2013-01-10 DIAGNOSIS — C55 Malignant neoplasm of uterus, part unspecified: Secondary | ICD-10-CM | POA: Diagnosis present

## 2013-01-10 DIAGNOSIS — R7989 Other specified abnormal findings of blood chemistry: Secondary | ICD-10-CM

## 2013-01-10 DIAGNOSIS — J9819 Other pulmonary collapse: Secondary | ICD-10-CM | POA: Diagnosis present

## 2013-01-10 DIAGNOSIS — I2789 Other specified pulmonary heart diseases: Secondary | ICD-10-CM | POA: Diagnosis present

## 2013-01-10 DIAGNOSIS — I5181 Takotsubo syndrome: Principal | ICD-10-CM | POA: Diagnosis present

## 2013-01-10 DIAGNOSIS — E860 Dehydration: Secondary | ICD-10-CM | POA: Diagnosis present

## 2013-01-10 DIAGNOSIS — K219 Gastro-esophageal reflux disease without esophagitis: Secondary | ICD-10-CM | POA: Diagnosis present

## 2013-01-10 DIAGNOSIS — I200 Unstable angina: Secondary | ICD-10-CM

## 2013-01-10 DIAGNOSIS — J45909 Unspecified asthma, uncomplicated: Secondary | ICD-10-CM | POA: Diagnosis present

## 2013-01-10 HISTORY — DX: Malignant neoplasm of uterus, part unspecified: C55

## 2013-01-10 HISTORY — DX: Pulmonary hypertension, unspecified: I27.20

## 2013-01-10 HISTORY — DX: Takotsubo syndrome: I51.81

## 2013-01-10 HISTORY — DX: Gastro-esophageal reflux disease without esophagitis: K21.9

## 2013-01-10 HISTORY — DX: Ventricular premature depolarization: I49.3

## 2013-01-10 LAB — CBC WITH DIFFERENTIAL/PLATELET
Basophils Absolute: 0 10*3/uL (ref 0.0–0.1)
Basophils Relative: 0 % (ref 0–1)
Eosinophils Absolute: 0 10*3/uL (ref 0.0–0.7)
Eosinophils Relative: 0 % (ref 0–5)
HCT: 42 % (ref 36.0–46.0)
Hemoglobin: 15.1 g/dL — ABNORMAL HIGH (ref 12.0–15.0)
Lymphocytes Relative: 18 % (ref 12–46)
Lymphs Abs: 2.2 10*3/uL (ref 0.7–4.0)
MCH: 29 pg (ref 26.0–34.0)
MCHC: 36 g/dL (ref 30.0–36.0)
MCV: 80.6 fL (ref 78.0–100.0)
Monocytes Absolute: 1.2 10*3/uL — ABNORMAL HIGH (ref 0.1–1.0)
Monocytes Relative: 10 % (ref 3–12)
Neutro Abs: 8.8 10*3/uL — ABNORMAL HIGH (ref 1.7–7.7)
Neutrophils Relative %: 72 % (ref 43–77)
Platelets: 393 10*3/uL (ref 150–400)
RBC: 5.21 MIL/uL — ABNORMAL HIGH (ref 3.87–5.11)
RDW: 12.9 % (ref 11.5–15.5)
WBC: 12.2 10*3/uL — ABNORMAL HIGH (ref 4.0–10.5)

## 2013-01-10 LAB — COMPREHENSIVE METABOLIC PANEL
ALT: 15 U/L (ref 0–35)
AST: 50 U/L — ABNORMAL HIGH (ref 0–37)
Albumin: 3.5 g/dL (ref 3.5–5.2)
Alkaline Phosphatase: 68 U/L (ref 39–117)
BUN: 10 mg/dL (ref 6–23)
CO2: 26 mEq/L (ref 19–32)
Calcium: 9.9 mg/dL (ref 8.4–10.5)
Chloride: 93 mEq/L — ABNORMAL LOW (ref 96–112)
Creatinine, Ser: 0.73 mg/dL (ref 0.50–1.10)
GFR calc Af Amer: >90 mL/min (ref >90)
GFR calc non Af Amer: 87 mL/min — ABNORMAL LOW (ref >90)
Glucose, Bld: 111 mg/dL — ABNORMAL HIGH (ref 70–99)
Potassium: 3.1 mEq/L — ABNORMAL LOW (ref 3.5–5.1)
Sodium: 131 mEq/L — ABNORMAL LOW (ref 135–145)
Total Bilirubin: 0.6 mg/dL (ref 0.3–1.2)
Total Protein: 8.6 g/dL — ABNORMAL HIGH (ref 6.0–8.3)

## 2013-01-10 LAB — PROTIME-INR
INR: 1.03 (ref 0.00–1.49)
Prothrombin Time: 13.4 seconds (ref 11.6–15.2)

## 2013-01-10 LAB — POCT I-STAT TROPONIN I
Troponin i, poc: 8.05 ng/mL (ref 0.00–0.08)
Troponin i, poc: 6.47 ng/mL (ref 0.00–0.08)

## 2013-01-10 LAB — MRSA PCR SCREENING: MRSA by PCR: NEGATIVE

## 2013-01-10 LAB — TROPONIN I: Troponin I: 13.69 ng/mL (ref <0.30)

## 2013-01-10 LAB — APTT: aPTT: 35 seconds (ref 24–37)

## 2013-01-10 MED ORDER — NITROGLYCERIN 0.4 MG SL SUBL: 0.4000 mg | SUBLINGUAL_TABLET | SUBLINGUAL | Status: DC | PRN

## 2013-01-10 MED ORDER — ACETAMINOPHEN 325 MG PO TABS
650.0000 mg | ORAL_TABLET | ORAL | Status: DC | PRN
  Administered 2013-01-11: 650 mg via ORAL
  Filled 2013-01-10: qty 2

## 2013-01-10 MED ORDER — METOPROLOL TARTRATE 1 MG/ML IV SOLN
5.0000 mg | Freq: Once | INTRAVENOUS | Status: AC
  Administered 2013-01-10: 5 mg via INTRAVENOUS
  Filled 2013-01-10: qty 5

## 2013-01-10 MED ORDER — ATORVASTATIN CALCIUM 40 MG PO TABS
40.0000 mg | ORAL_TABLET | Freq: Every day | ORAL | Status: DC
  Administered 2013-01-11 – 2013-01-13 (×3): 40 mg via ORAL
  Filled 2013-01-10 (×4): qty 1

## 2013-01-10 MED ORDER — IRBESARTAN 300 MG PO TABS
300.0000 mg | ORAL_TABLET | Freq: Every day | ORAL | Status: DC
  Administered 2013-01-11 – 2013-01-14 (×4): 300 mg via ORAL
  Filled 2013-01-10 (×4): qty 1

## 2013-01-10 MED ORDER — HEPARIN BOLUS VIA INFUSION
3200.0000 [IU] | Freq: Once | INTRAVENOUS | Status: AC
  Administered 2013-01-10: 3200 [IU] via INTRAVENOUS

## 2013-01-10 MED ORDER — ASPIRIN 81 MG PO CHEW
324.0000 mg | CHEWABLE_TABLET | Freq: Once | ORAL | Status: AC
  Administered 2013-01-10: 324 mg via ORAL
  Filled 2013-01-10: qty 4

## 2013-01-10 MED ORDER — LORATADINE 10 MG PO TABS
10.0000 mg | ORAL_TABLET | Freq: Every day | ORAL | Status: DC
  Administered 2013-01-11 – 2013-01-14 (×4): 10 mg via ORAL
  Filled 2013-01-10 (×4): qty 1

## 2013-01-10 MED ORDER — ONDANSETRON HCL 4 MG/2ML IJ SOLN: 4.0000 mg | Freq: Four times a day (QID) | INTRAMUSCULAR | Status: DC | PRN

## 2013-01-10 MED ORDER — VALSARTAN-HYDROCHLOROTHIAZIDE 320-25 MG PO TABS: 1.0000 | ORAL_TABLET | Freq: Every day | ORAL | Status: DC

## 2013-01-10 MED ORDER — PANTOPRAZOLE SODIUM 20 MG PO TBEC
20.0000 mg | DELAYED_RELEASE_TABLET | Freq: Every day | ORAL | Status: DC
  Administered 2013-01-11 – 2013-01-14 (×4): 20 mg via ORAL
  Filled 2013-01-10 (×4): qty 1

## 2013-01-10 MED ORDER — ASPIRIN EC 81 MG PO TBEC
81.0000 mg | DELAYED_RELEASE_TABLET | Freq: Every day | ORAL | Status: DC
  Filled 2013-01-10: qty 1

## 2013-01-10 MED ORDER — SODIUM CHLORIDE 0.9 % IV BOLUS (SEPSIS)
1000.0000 mL | Freq: Once | INTRAVENOUS | Status: AC
  Administered 2013-01-10: 1000 mL via INTRAVENOUS

## 2013-01-10 MED ORDER — METOPROLOL TARTRATE 25 MG PO TABS
25.0000 mg | ORAL_TABLET | Freq: Two times a day (BID) | ORAL | Status: DC
  Administered 2013-01-10 – 2013-01-11 (×3): 25 mg via ORAL
  Filled 2013-01-10 (×6): qty 1

## 2013-01-10 MED ORDER — POTASSIUM CHLORIDE CRYS ER 20 MEQ PO TBCR
40.0000 meq | EXTENDED_RELEASE_TABLET | Freq: Once | ORAL | Status: AC
  Administered 2013-01-10: 40 meq via ORAL
  Filled 2013-01-10: qty 2

## 2013-01-10 MED ORDER — HYDROCHLOROTHIAZIDE 25 MG PO TABS
25.0000 mg | ORAL_TABLET | Freq: Every day | ORAL | Status: DC
  Administered 2013-01-11: 25 mg via ORAL
  Filled 2013-01-10 (×2): qty 1

## 2013-01-10 MED ORDER — HEPARIN (PORCINE) IN NACL 100-0.45 UNIT/ML-% IJ SOLN
750.0000 [IU]/h | INTRAMUSCULAR | Status: DC
  Filled 2013-01-10 (×2): qty 250

## 2013-01-10 MED ORDER — MOMETASONE FURO-FORMOTEROL FUM 100-5 MCG/ACT IN AERO
2.0000 | INHALATION_SPRAY | Freq: Two times a day (BID) | RESPIRATORY_TRACT | Status: DC
  Administered 2013-01-11 – 2013-01-14 (×7): 2 via RESPIRATORY_TRACT
  Filled 2013-01-10: qty 8.8

## 2013-01-10 MED ORDER — SODIUM CHLORIDE 0.9 % IV SOLN
INTRAVENOUS | Status: DC
  Administered 2013-01-10: 10 mL via INTRAVENOUS

## 2013-01-10 NOTE — ED Notes (Signed)
[id number]<WG> Expected date:<BR> Expected time:<BR> Means of arrival:<BR> Comments:<BR> Hold for triage 4

## 2013-01-10 NOTE — ED Provider Notes (Signed)
History    67 year old female presenting with tachycardia. Patient has a history of hypertension and states that she rarely checks her blood pressure. Today she noted that her heart rate was in the 130s. She called her PCP because of this and she was instructed to present to the emergency room. Patient has no complaints. She denies any palpitations. Denies any chest pain or shortness of breath. No fevers or chills. No n/v or diaphoresis. No dizziness or lightheadedness. No unusual leg pain or swelling. Patient has a past history of hypertension high cholesterol. No known history of coronary artery disease that she is aware of. She reports that she's never been formally evaluated by cardiologist.  CSN: [id number]  Arrival date & time 01/10/13  1704   First MD Initiated Contact with Patient 01/10/13 1917      Chief Complaint  Patient presents with  . elevated heart rate     (Consider location/radiation/quality/duration/timing/severity/associated sxs/prior treatment) HPI  Past Medical History  Diagnosis Date  . Hypertension   . Asthma   . Seasonal allergies   . High cholesterol     Past Surgical History  Procedure Laterality Date  . Abdominal hysterectomy      No family history on file.  History  Substance Use Topics  . Smoking status: Never Smoker   . Smokeless tobacco: Not on file  . Alcohol Use: No    OB History   Grav Para Term Preterm Abortions TAB SAB Ect Mult Living                  Review of Systems  Allergies  Cetirizine hcl  Home Medications   Current Outpatient Rx  Name  Route  Sig  Dispense  Refill  . alendronate (FOSAMAX) 70 MG tablet   Oral   Take 70 mg by mouth every 7 (seven) days. Take with a full glass of water on an empty stomach.         . cloNIDine (CATAPRES) 0.1 MG tablet   Oral   Take 0.1 mg by mouth 2 (two) times daily.           . fluticasone (VERAMYST) 27.5 MCG/SPRAY nasal spray   Nasal   Place 2 sprays into the nose daily.            . fluticasone-salmeterol (ADVAIR HFA) 115-21 MCG/ACT inhaler   Inhalation   Inhale 2 puffs into the lungs 2 (two) times daily.           Marland Kitchen loratadine (CLARITIN) 10 MG tablet   Oral   Take 10 mg by mouth daily.           . multivitamin-iron-minerals-folic acid (CENTRUM) chewable tablet   Oral   Chew 1 tablet by mouth daily.           . pantoprazole (PROTONIX) 20 MG tablet   Oral   Take 20 mg by mouth daily.           . valsartan-hydrochlorothiazide (DIOVAN-HCT) 320-25 MG per tablet   Oral   Take 1 tablet by mouth daily.           BP 96/72  Pulse 122  Temp(Src) 98.1 F (36.7 C) (Oral)  Resp 18  SpO2 100%  Physical Exam  Nursing note and vitals reviewed. Constitutional: She appears well-developed and well-nourished. No distress.  HENT:  Head: Normocephalic and atraumatic.  Eyes: Conjunctivae are normal. Right eye exhibits no discharge. Left eye exhibits no discharge.  Neck: Neck supple.  Cardiovascular: Regular rhythm and normal heart sounds.  Exam reveals no gallop and no friction rub.   No murmur heard. tachycardic  Pulmonary/Chest: Effort normal and breath sounds normal. No respiratory distress.  Abdominal: Soft. She exhibits no distension. There is no tenderness.  Musculoskeletal: She exhibits no edema and no tenderness.  Lower extremities symmetric as compared to each other. No calf tenderness. Negative Homan's. No palpable cords.   Neurological: She is alert.  Skin: Skin is warm and dry. She is not diaphoretic.  Psychiatric: She has a normal mood and affect. Her behavior is normal. Thought content normal.    ED Course  Procedures (including critical care time)  CRITICAL CARE Performed by: Raeford Razor Total critical care time: 35 minutes Critical care time was exclusive of separately billable procedures and treating other patients. Critical care was necessary to treat or prevent imminent or life-threatening deterioration. Critical  care was time spent personally by me on the following activities: development of treatment plan with patient and/or surrogate as well as nursing, discussions with consultants, evaluation of patient's response to treatment, examination of patient, obtaining history from patient or surrogate, ordering and performing treatments and interventions, ordering and review of laboratory studies, ordering and review of radiographic studies, pulse oximetry and re-evaluation of patient's condition.   Labs Reviewed  CBC WITH DIFFERENTIAL - Abnormal; Notable for the following:    WBC 12.2 (*)    RBC 5.21 (*)    Hemoglobin 15.1 (*)    Neutro Abs 8.8 (*)    Monocytes Absolute 1.2 (*)    All other components within normal limits  COMPREHENSIVE METABOLIC PANEL - Abnormal; Notable for the following:    Sodium 131 (*)    Potassium 3.1 (*)    Chloride 93 (*)    Glucose, Bld 111 (*)    Total Protein 8.6 (*)    AST 50 (*)    GFR calc non Af Amer 87 (*)    All other components within normal limits  TROPONIN I - Abnormal; Notable for the following:    Troponin I 13.69 (*)    All other components within normal limits  TROPONIN I - Abnormal; Notable for the following:    Troponin I 11.71 (*)    All other components within normal limits  TROPONIN I - Abnormal; Notable for the following:    Troponin I 10.66 (*)    All other components within normal limits  TROPONIN I - Abnormal; Notable for the following:    Troponin I 7.96 (*)    All other components within normal limits  HEMOGLOBIN A1C - Abnormal; Notable for the following:    Hemoglobin A1C 6.2 (*)    Mean Plasma Glucose 131 (*)    All other components within normal limits  CBC - Abnormal; Notable for the following:    WBC 13.4 (*)    All other components within normal limits  BASIC METABOLIC PANEL - Abnormal; Notable for the following:    Sodium 133 (*)    Potassium 2.8 (*)    Glucose, Bld 142 (*)    All other components within normal limits   HEPARIN LEVEL (UNFRACTIONATED) - Abnormal; Notable for the following:    Heparin Unfractionated 0.26 (*)    All other components within normal limits  PRO B NATRIURETIC PEPTIDE - Abnormal; Notable for the following:    Pro B Natriuretic peptide (BNP) 13925.0 (*)    All other components within normal limits  HEPARIN LEVEL (UNFRACTIONATED) - Abnormal; Notable  for the following:    Heparin Unfractionated 0.28 (*)    All other components within normal limits  POCT I-STAT TROPONIN I - Abnormal; Notable for the following:    Troponin i, poc 8.05 (*)    All other components within normal limits  POCT I-STAT TROPONIN I - Abnormal; Notable for the following:    Troponin i, poc 6.47 (*)    All other components within normal limits  POCT I-STAT 3, BLOOD GAS (G3+) - Abnormal; Notable for the following:    pH, Arterial 7.468 (*)    pCO2 arterial 28.0 (*)    pO2, Arterial 38.0 (*)    All other components within normal limits  POCT I-STAT 3, BLOOD GAS (G3P V) - Abnormal; Notable for the following:    pH, Ven 7.434 (*)    pCO2, Ven 33.0 (*)    pO2, Ven 25.0 (*)    All other components within normal limits  POCT I-STAT 3, BLOOD GAS (G3P V) - Abnormal; Notable for the following:    pH, Ven 7.430 (*)    pCO2, Ven 30.3 (*)    pO2, Ven 22.0 (*)    Acid-base deficit 3.0 (*)    All other components within normal limits  POCT I-STAT 3, BLOOD GAS (G3+) - Abnormal; Notable for the following:    pH, Arterial 7.458 (*)    pCO2 arterial 29.2 (*)    pO2, Arterial 43.0 (*)    All other components within normal limits  MRSA PCR SCREENING  PROTIME-INR  APTT  LIPID PANEL  POTASSIUM  CBC  CREATININE, SERUM  CBC  POCT ACTIVATED CLOTTING TIME   Dg Chest 2 View  01/10/2013   *RADIOLOGY REPORT*  Clinical Data: Tachycardia.  Positive troponin.  History of hypertension.  CHEST - 2 VIEW  Comparison: 08/12/2012  Findings: Mild cardiac enlargement with mild pulmonary vascular congestion, developing since  previous study.  No significant edema. Focal area of infiltration in the left upper lung may represent superimposed pneumonia or vascular crowding.  Underlying diffuse emphysematous changes.  Small bilateral pleural effusions.  IMPRESSION: Cardiac enlargement with pulmonary vascular congestion and small pleural effusions.  Superimposed infiltration in the left upper lung.  Underlying emphysematous changes.   Original Report Authenticated By: Burman Nieves, M.D.    EKG: initial Rhythm: sinus tachycardia Vent. rate 118 BPM PR interval 180 ms QRS duration 102 ms QT/QTc 328/459 ms Abnormal r wave progression ST segments: Mild STE V2. TWI leads I, II, V4-6 Comparison: none  EKG:  Rhythm: sinus tachycardia Rate: 121 1st degree AV block ST segments: Mild STE V2. TWI I, II, aVF and V4-6     1. Non-STEMI (non-ST elevated myocardial infarction)       MDM  Pt with NSTEMI. Likely anterior infarct. Pt completely asymptomatic. ASA and heparin ordered. Cardiology consultation. Admit.         Raeford Razor, MD 01/11/13 [id number]

## 2013-01-10 NOTE — H&P (Addendum)
History and Physical  Patient ID: Deanna Brown MRN: [id number], SOB: 07-08-1946 67 y.o. Date of Encounter: 01/10/2013, 8:36 PM  Primary Physician: Geraldo Pitter, MD Primary Cardiologist: none  Chief Complaint: fast pulase  HPI: 67 y.o. female w/ PMHx significant for HTN, GERD who presented to [facility] on 01/10/2013 with complaints of fast heart rate.  Interestingly, she has no other complaints. She routinely checks her BP and happened to note that her pulse was in the 130s. She called her PCP who referred her to the ER.  No chest pain, pressure, SOB, DOE, headache, back pain, cough, LE swelling, orthopnea. No fever, chills, nausea, vomiting, abdominal pain. Just to emphasize again, she states she is feeling completely normal.  In the ER, she was noted to have an istat troponin of 8. She was given aspirin, heparin gtt started. KCL administered due to K of 3.1  EKG revealed sinus tachycardia with borderline criteria for LVH, poor R wave progression, significant TWI in V4-V6, II and AVF. Labs are significant for trop of 8, K of 3.1, Na of 131, AST at 50, WBC 12.2. Due to concern of possibly being dehydrate leading to tachycardia, she was given 1 L NS.  Cxray performed afterwards demonstrated cardiac enlargement, vascular congestion. Possible infiltration in RUL.    Past Medical History  Diagnosis Date  . Hypertension   . Asthma   . Seasonal allergies   . High cholesterol      Surgical History:  Past Surgical History  Procedure Laterality Date  . Abdominal hysterectomy       Home Meds: Prior to Admission medications   Medication Sig Start Date End Date Taking? Authorizing Provider  alendronate (FOSAMAX) 70 MG tablet Take 70 mg by mouth every 7 (seven) days. Take with a full glass of water on an empty stomach.   Yes Historical Provider, MD  cloNIDine (CATAPRES) 0.1 MG tablet Take 0.1 mg by mouth 2 (two) times daily.     Yes Historical Provider, MD  fluticasone  (VERAMYST) 27.5 MCG/SPRAY nasal spray Place 2 sprays into the nose daily.     Yes Historical Provider, MD  fluticasone-salmeterol (ADVAIR HFA) 115-21 MCG/ACT inhaler Inhale 2 puffs into the lungs 2 (two) times daily.     Yes Historical Provider, MD  loratadine (CLARITIN) 10 MG tablet Take 10 mg by mouth daily.     Yes Historical Provider, MD  multivitamin-iron-minerals-folic acid (CENTRUM) chewable tablet Chew 1 tablet by mouth daily.     Yes Historical Provider, MD  pantoprazole (PROTONIX) 20 MG tablet Take 20 mg by mouth daily.     Yes Historical Provider, MD  valsartan-hydrochlorothiazide (DIOVAN-HCT) 320-25 MG per tablet Take 1 tablet by mouth daily.   Yes Historical Provider, MD    Allergies:  Allergies  Allergen Reactions  . Cetirizine Hcl Itching    History   Social History  . Marital Status: Widowed    Spouse Name: N/A    Number of Children: N/A  . Years of Education: N/A   Occupational History  . Not on file.   Social History Main Topics  . Smoking status: Never Smoker   . Smokeless tobacco: Not on file  . Alcohol Use: No  . Drug Use: No  . Sexually Active:    Other Topics Concern  . Not on file   Social History Narrative  . No narrative on file     FH: Mother with CAD s/p "silent MI" and AVR  Review of Systems: Completely  negative. General: negative for chills, fever, night sweats or weight changes.  Cardiovascular: negative for chest pain, shortness of breath, dyspnea on exertion, edema, orthopnea, palpitations, paroxysmal nocturnal dyspnea  Dermatological: negative for rash Respiratory: negative for cough or wheezing Urologic: negative for hematuria Abdominal: negative for nausea, vomiting, diarrhea, bright red blood per rectum, melena, or hematemesis Neurologic: negative for visual changes, syncope, or dizziness All other systems reviewed and are otherwise negative except as noted above.  Labs:   Lab Results  Component Value Date   WBC 12.2*  01/10/2013   HGB 15.1* 01/10/2013   HCT 42.0 01/10/2013   MCV 80.6 01/10/2013   PLT 393 01/10/2013    Recent Labs Lab 01/10/13 1857  NA 131*  K 3.1*  CL 93*  CO2 26  BUN 10  CREATININE 0.73  CALCIUM 9.9  PROT 8.6*  BILITOT 0.6  ALKPHOS 68  ALT 15  AST 50*  GLUCOSE 111*   No results found for this basename: CKTOTAL, CKMB, TROPONINI,  in the last 72 hours Lab Results  Component Value Date   CHOL 139 01/21/2010   HDL 52 01/21/2010   LDLCALC 79 01/21/2010   TRIG 42 01/21/2010   No results found for this basename: DDIMER    Radiology/Studies:  Chest xray:  IMPRESSION:  Cardiac enlargement with pulmonary vascular congestion and small  pleural effusions. Superimposed infiltration in the left upper  lung. Underlying emphysematous changes.   EKG: sinus tachycardia with borderline criteria for LVH, poor R wave progression, significant TWI in V4-V6, II and AVF. No comparison.  Physical Exam: Blood pressure 115/86, pulse 114, temperature 98.1 F (36.7 C), temperature source Oral, resp. rate 18, height 5\' 1"  (1.549 m), weight 53.524 kg (118 lb), SpO2 96.00%. General: Well developed, well nourished, in no acute distress. Head: Normocephalic, atraumatic, sclera non-icteric, nares are without discharge Neck: Supple. Negative for carotid bruits. JVD not elevated. Lungs: Clear bilaterally to auscultation without wheezes, rales, or rhonchi.  Heart: RRR with S1 S2. No murmurs, rubs, or gallops appreciated. Abdomen: Soft, non-tender, non-distended with normoactive bowel sounds. No rebound/guarding. No obvious abdominal masses. Msk:  Strength and tone appear normal for age. Extremities: No edema. No clubbing or cyanosis. Distal pedal pulses are 2+ and equal bilaterally. Neuro: Alert and oriented X 3. Moves all extremities spontaneously. Psych:  Responds to questions appropriately with a normal affect.   Problem List 1. + Troponin but asymptomatic?, Acute coronary syndrome until proven  otherwise 2. Hypertension 3. Sinus tachycardia 4. Hypokalemia 5. Hyponatremia 6. GERD 7. Elevated LFTs 8. Cxray suggesting fluid overload, EF unknown  ASSESSMENT AND PLAN:  67 y.o. female w/ PMHx significant for HTN, GERD who presented to [facility] on 01/10/2013 with complaints of fast heart rate, found to have elevated troponin and TWI on EKG.  Interestingly, she is completely asymptomatic and does not endorse any symptoms despite multiple questioning, open and close ended questions. Though based upon her presentation, acute coronary syndrome is highest on the differential due to the EKG changes, troponin and risk factors. No symptoms to suggest other cause (myocarditis, PE).  Will monitor with telemetry, serial enzymes, step down care due to significantly elevated troponins.  Add beta blocker and discontinue clonidine which has no favorable cardiac profile.  Continue ARB/HCTZ combination. Repleted K+. Continue ASA, heparin gtt due to elevated TIMI score.  Will start statin though noted her AST is slightly elevated (cardiac source?)  Cxray suggested vascular fullness though no other evidence of right sided heart failure (  JVP or LE swelling). Hold on further fluids. RHC may be beneficial to further evaluate tomorrow. BNP to correlate. Echo also to better evaluate LV systolic function.  NPO for RHC and LHC cath in the AM.  Prophylaxis: Heparin gtt. Full code  Signed, Georgetta Haber. MD 01/10/2013, 8:36 PM

## 2013-01-10 NOTE — ED Notes (Signed)
MD Juleen China, Charge nurse Misty Stanley and triage nurse Terri all made aware of the pts critical troponin result of 8.05. Test being repeated

## 2013-01-10 NOTE — ED Notes (Signed)
[facility] was notified of pt's transfer need to [facility].

## 2013-01-10 NOTE — Progress Notes (Signed)
ANTICOAGULATION CONSULT NOTE - Initial Consult  Pharmacy Consult for IV Heparin Indication: chest pain/ACS  Allergies  Allergen Reactions  . Cetirizine Hcl Itching    Patient Measurements: Height: 5\' 1"  (154.9 cm) Weight: 118 lb (53.524 kg) IBW/kg (Calculated) : 47.8 Heparin Dosing Weight: 53.5 kg  Vital Signs: Temp: 98.1 F (36.7 C) (06/09 1747) Temp src: Oral (06/09 1747) BP: 96/72 mmHg (06/09 1747) Pulse Rate: 122 (06/09 1747)  Labs:  Recent Labs  01/10/13 1857  HGB 15.1*  HCT 42.0  PLT 393  CREATININE 0.73    Estimated Creatinine Clearance: 52.2 ml/min (by C-G formula based on Cr of 0.73).   Medical History: Past Medical History  Diagnosis Date  . Hypertension   . Asthma   . Seasonal allergies   . High cholesterol     Medications:  Scheduled:   Infusions:   Assessment: 58 yoF presenting with elevated HR and high troponin to begin heparin per pharmacy for NSTEMI.  Baseline CBC, SCr WNL.  PT/INR, PTT ordered.    Goal of Therapy:  Heparin level 0.3-0.7 units/ml Monitor platelets by anticoagulation protocol: Yes   Plan:  1.  Heparin 3200 unit IV bolus x 1 followed by start of heparin infusion at 650 units/hr (6.5 mL/hr).   2.  Check heparin level 6 hours following start of heparin.   3.  Daily HL and CBC.  Clance Boll 01/10/2013,8:02 PM

## 2013-01-10 NOTE — ED Notes (Signed)
Pt presenting to ed with c/o elevated heart rate today at home of 135. Pt denies chest pain at this time. Pt denies nausea, vomiting and dizziness at this time

## 2013-01-10 NOTE — ED Notes (Signed)
[facility] here to transport pt to [facility]. 

## 2013-01-11 ENCOUNTER — Encounter (HOSPITAL_COMMUNITY)
Admission: EM | Disposition: A | Discharge status: Home / Self Care | Payer: Self-pay | Source: Home / Self Care | Attending: Cardiology

## 2013-01-11 DIAGNOSIS — R079 Chest pain, unspecified: Secondary | ICD-10-CM

## 2013-01-11 DIAGNOSIS — I214 Non-ST elevation (NSTEMI) myocardial infarction: Secondary | ICD-10-CM

## 2013-01-11 HISTORY — PX: LEFT AND RIGHT HEART CATHETERIZATION WITH CORONARY ANGIOGRAM: SHX5449

## 2013-01-11 LAB — POCT I-STAT 3, ART BLOOD GAS (G3+)
Acid-base deficit: 2 mmol/L (ref 0.0–2.0)
Acid-base deficit: 2 mmol/L (ref 0.0–2.0)
Bicarbonate: 20.3 mEq/L (ref 20.0–24.0)
Bicarbonate: 20.6 mEq/L (ref 20.0–24.0)
O2 Saturation: 77 %
O2 Saturation: 82 %
TCO2: 21 mmol/L (ref 0–100)
TCO2: 22 mmol/L (ref 0–100)
pCO2 arterial: 28 mmHg — ABNORMAL LOW (ref 35.0–45.0)
pCO2 arterial: 29.2 mmHg — ABNORMAL LOW (ref 35.0–45.0)
pH, Arterial: 7.458 — ABNORMAL HIGH (ref 7.350–7.450)
pH, Arterial: 7.468 — ABNORMAL HIGH (ref 7.350–7.450)
pO2, Arterial: 38 mmHg — CL (ref 80.0–100.0)
pO2, Arterial: 43 mmHg — ABNORMAL LOW (ref 80.0–100.0)

## 2013-01-11 LAB — POCT I-STAT 3, VENOUS BLOOD GAS (G3P V)
Acid-base deficit: 1 mmol/L (ref 0.0–2.0)
Acid-base deficit: 3 mmol/L — ABNORMAL HIGH (ref 0.0–2.0)
Bicarbonate: 20.1 mEq/L (ref 20.0–24.0)
Bicarbonate: 22.1 mEq/L (ref 20.0–24.0)
O2 Saturation: 42 %
O2 Saturation: 47 %
TCO2: 21 mmol/L (ref 0–100)
TCO2: 23 mmol/L (ref 0–100)
pCO2, Ven: 30.3 mmHg — ABNORMAL LOW (ref 45.0–50.0)
pCO2, Ven: 33 mmHg — ABNORMAL LOW (ref 45.0–50.0)
pH, Ven: 7.43 — ABNORMAL HIGH (ref 7.250–7.300)
pH, Ven: 7.434 — ABNORMAL HIGH (ref 7.250–7.300)
pO2, Ven: 22 mmHg — CL (ref 30.0–45.0)
pO2, Ven: 25 mmHg — CL (ref 30.0–45.0)

## 2013-01-11 LAB — LIPID PANEL
Cholesterol: 159 mg/dL (ref 0–200)
HDL: 52 mg/dL (ref >39)
LDL Cholesterol: 94 mg/dL (ref 0–99)
Total CHOL/HDL Ratio: 3.1 RATIO
Triglycerides: 65 mg/dL (ref <150)
VLDL: 13 mg/dL (ref 0–40)

## 2013-01-11 LAB — BASIC METABOLIC PANEL
BUN: 10 mg/dL (ref 6–23)
CO2: 23 mEq/L (ref 19–32)
Calcium: 8.8 mg/dL (ref 8.4–10.5)
Chloride: 99 mEq/L (ref 96–112)
Creatinine, Ser: 0.65 mg/dL (ref 0.50–1.10)
GFR calc Af Amer: >90 mL/min (ref >90)
GFR calc non Af Amer: >90 mL/min (ref >90)
Glucose, Bld: 142 mg/dL — ABNORMAL HIGH (ref 70–99)
Potassium: 2.8 mEq/L — ABNORMAL LOW (ref 3.5–5.1)
Sodium: 133 mEq/L — ABNORMAL LOW (ref 135–145)

## 2013-01-11 LAB — CBC
HCT: 36.8 % (ref 36.0–46.0)
HCT: 39 % (ref 36.0–46.0)
Hemoglobin: 13.2 g/dL (ref 12.0–15.0)
Hemoglobin: 13.5 g/dL (ref 12.0–15.0)
MCH: 28.9 pg (ref 26.0–34.0)
MCH: 28.4 pg (ref 26.0–34.0)
MCHC: 35.9 g/dL (ref 30.0–36.0)
MCHC: 34.6 g/dL (ref 30.0–36.0)
MCV: 80.7 fL (ref 78.0–100.0)
MCV: 82.1 fL (ref 78.0–100.0)
Platelets: 340 10*3/uL (ref 150–400)
Platelets: 345 10*3/uL (ref 150–400)
RBC: 4.56 MIL/uL (ref 3.87–5.11)
RBC: 4.75 MIL/uL (ref 3.87–5.11)
RDW: 13.3 % (ref 11.5–15.5)
RDW: 13.4 % (ref 11.5–15.5)
WBC: 13.4 10*3/uL — ABNORMAL HIGH (ref 4.0–10.5)
WBC: 11.3 10*3/uL — ABNORMAL HIGH (ref 4.0–10.5)

## 2013-01-11 LAB — CREATININE, SERUM
Creatinine, Ser: 0.74 mg/dL (ref 0.50–1.10)
GFR calc Af Amer: >90 mL/min (ref >90)
GFR calc non Af Amer: 87 mL/min — ABNORMAL LOW (ref >90)

## 2013-01-11 LAB — HEPARIN LEVEL (UNFRACTIONATED)
Heparin Unfractionated: 0.26 IU/mL — ABNORMAL LOW (ref 0.30–0.70)
Heparin Unfractionated: 0.28 IU/mL — ABNORMAL LOW (ref 0.30–0.70)

## 2013-01-11 LAB — TROPONIN I
Troponin I: 10.66 ng/mL (ref <0.30)
Troponin I: 11.71 ng/mL (ref <0.30)
Troponin I: 7.96 ng/mL (ref <0.30)

## 2013-01-11 LAB — POCT ACTIVATED CLOTTING TIME: Activated Clotting Time: 111 seconds

## 2013-01-11 LAB — POTASSIUM: Potassium: 3.6 mEq/L (ref 3.5–5.1)

## 2013-01-11 LAB — HEMOGLOBIN A1C
Hgb A1c MFr Bld: 6.2 % — ABNORMAL HIGH (ref <5.7)
Mean Plasma Glucose: 131 mg/dL — ABNORMAL HIGH (ref <117)

## 2013-01-11 LAB — PRO B NATRIURETIC PEPTIDE: Pro B Natriuretic peptide (BNP): 13925 pg/mL — ABNORMAL HIGH (ref 0–125)

## 2013-01-11 SURGERY — LEFT AND RIGHT HEART CATHETERIZATION WITH CORONARY ANGIOGRAM
Anesthesia: LOCAL

## 2013-01-11 MED ORDER — SODIUM CHLORIDE 0.9 % IV SOLN: INTRAVENOUS | Status: DC

## 2013-01-11 MED ORDER — DIAZEPAM 5 MG PO TABS
5.0000 mg | ORAL_TABLET | ORAL | Status: AC
  Administered 2013-01-11: 5 mg via ORAL
  Filled 2013-01-11: qty 1

## 2013-01-11 MED ORDER — POTASSIUM CHLORIDE 10 MEQ/100ML IV SOLN
INTRAVENOUS | Status: AC
  Administered 2013-01-11: 10 meq
  Filled 2013-01-11: qty 100

## 2013-01-11 MED ORDER — SODIUM CHLORIDE 0.9 % IJ SOLN: 3.0000 mL | Freq: Two times a day (BID) | INTRAMUSCULAR | Status: DC

## 2013-01-11 MED ORDER — MIDAZOLAM HCL 2 MG/2ML IJ SOLN
INTRAMUSCULAR | Status: AC
  Filled 2013-01-11: qty 2

## 2013-01-11 MED ORDER — SODIUM CHLORIDE 0.9 % IJ SOLN: 3.0000 mL | INTRAMUSCULAR | Status: DC | PRN

## 2013-01-11 MED ORDER — POTASSIUM CHLORIDE 20 MEQ/15ML (10%) PO LIQD
40.0000 meq | Freq: Once | ORAL | Status: AC
  Administered 2013-01-11: 40 meq via ORAL
  Filled 2013-01-11: qty 15

## 2013-01-11 MED ORDER — HEPARIN SODIUM (PORCINE) 5000 UNIT/ML IJ SOLN
5000.0000 [IU] | Freq: Three times a day (TID) | INTRAMUSCULAR | Status: DC
  Administered 2013-01-12 – 2013-01-14 (×6): 5000 [IU] via SUBCUTANEOUS
  Filled 2013-01-11 (×10): qty 1

## 2013-01-11 MED ORDER — ASPIRIN EC 81 MG PO TBEC
81.0000 mg | DELAYED_RELEASE_TABLET | Freq: Every day | ORAL | Status: DC
  Administered 2013-01-12 – 2013-01-14 (×3): 81 mg via ORAL
  Filled 2013-01-11 (×3): qty 1

## 2013-01-11 MED ORDER — ASPIRIN 81 MG PO CHEW
324.0000 mg | CHEWABLE_TABLET | ORAL | Status: AC
  Administered 2013-01-11: 324 mg via ORAL
  Filled 2013-01-11 (×2): qty 1

## 2013-01-11 MED ORDER — HEPARIN (PORCINE) IN NACL 2-0.9 UNIT/ML-% IJ SOLN
INTRAMUSCULAR | Status: AC
  Filled 2013-01-11: qty 1500

## 2013-01-11 MED ORDER — POTASSIUM CHLORIDE 10 MEQ/100ML IV SOLN: 10.0000 meq | INTRAVENOUS | Status: DC

## 2013-01-11 MED ORDER — HEPARIN (PORCINE) IN NACL 100-0.45 UNIT/ML-% IJ SOLN
850.0000 [IU]/h | INTRAMUSCULAR | Status: DC
  Filled 2013-01-11: qty 250

## 2013-01-11 MED ORDER — SODIUM CHLORIDE 0.9 % IV SOLN: 250.0000 mL | INTRAVENOUS | Status: DC | PRN

## 2013-01-11 MED ORDER — LIDOCAINE HCL (PF) 1 % IJ SOLN
INTRAMUSCULAR | Status: AC
  Filled 2013-01-11: qty 30

## 2013-01-11 MED ORDER — POTASSIUM CHLORIDE 20 MEQ/15ML (10%) PO LIQD
ORAL | Status: AC
  Administered 2013-01-11: 20 meq
  Filled 2013-01-11: qty 15

## 2013-01-11 MED ORDER — FENTANYL CITRATE 0.05 MG/ML IJ SOLN
INTRAMUSCULAR | Status: AC
  Filled 2013-01-11: qty 2

## 2013-01-11 MED ORDER — SODIUM CHLORIDE 0.9 % IV SOLN: 1.0000 mL/kg/h | INTRAVENOUS | Status: AC

## 2013-01-11 NOTE — Interval H&P Note (Signed)
History and Physical Interval Note:  01/11/2013 2:09 PM  Deanna Brown  has presented today for surgery, with the diagnosis of cp  The various methods of treatment have been discussed with the patient and family. After consideration of risks, benefits and other options for treatment, the patient has consented to  Procedure(s): LEFT AND RIGHT HEART CATHETERIZATION WITH CORONARY ANGIOGRAM (N/A) as a surgical intervention .  The patient's history has been reviewed, patient examined, no change in status, stable for surgery.  I have reviewed the patient's chart and labs.  Questions were answered to the patient's satisfaction.     Theron Arista [facility] 01/11/2013 2:09 PM

## 2013-01-11 NOTE — Care Management Note (Signed)
    Page 1 of 1   01/11/2013     11:29:13 AM   CARE MANAGEMENT NOTE 01/11/2013  Patient:  Deanna Brown, Deanna Brown   Account Number:  [id number]  Date Initiated:  01/11/2013  Documentation initiated by:  Junius Creamer  Subjective/Objective Assessment:   adm w mi     Action/Plan:   lives w fam   Anticipated DC Date:     Anticipated DC Plan:        DC Planning Services  CM consult      Choice offered to / List presented to:             Status of service:   Medicare Important Message given?   (If response is "NO", the following Medicare IM given date fields will be blank) Date Medicare IM given:   Date Additional Medicare IM given:    Discharge Disposition:    Per UR Regulation:  Reviewed for med. necessity/level of care/duration of stay  If discussed at Long Length of Stay Meetings, dates discussed:    Comments:  6/10 1128a debbie Fradel Baldonado rn,bsn

## 2013-01-11 NOTE — CV Procedure (Signed)
   Cardiac Catheterization Procedure Note  Name: Deanna Brown MRN: [id number] DOB: 07-Apr-1946  Procedure: Right Heart Cath, Left Heart Cath, Selective Coronary Angiography, LV angiography  Indication: 67 yo BF with history of HTN and hyperlipidemia presents with chest pain. Ecg shows marked ST-T changes anteriorly with positive troponins.    Procedural Details: The right groin was prepped, draped, and anesthetized with 1% lidocaine. Using the modified Seldinger technique a 5 French sheath was placed in the right femoral artery and a 7 French sheath was placed in the right femoral vein. A Swan-Ganz catheter was used for the right heart catheterization. Standard protocol was followed for recording of right heart pressures and sampling of oxygen saturations. Fick cardiac output was calculated. Standard Judkins catheters were used for selective coronary angiography and left ventriculography. There were no immediate procedural complications. The patient was transferred to the post catheterization recovery area for further monitoring.  Procedural Findings: Hemodynamics RA 12/10 mean of 7 mm Hg RV 60/10 mm Hg PA 54/16 mean of 37 mm Hg PCWP 23/21 mean of 21 mm Hg LV 158/21  AO 158/93 mean of 117 mm Hg  Oxygen saturations: PA 47% AO 82%  Cardiac Output (Fick) 3.11 L/min  Cardiac Index (Fick) 2.12 L/min/m2   Coronary angiography: Coronary dominance: right  Left mainstem: Normal  Left anterior descending (LAD): Normal  Left circumflex (LCx): Normal  Right coronary artery (RCA): 20% in the proximal vessel.  Left ventriculography: Left ventricular systolic function is abnormal, LVEF is estimated at 25%, there is severe hypokinesis/akinesis of the mid to distal anterior wall and inferior wall. There is severe hypokinesis of the apex. There is sparing of the base. There is no significant mitral regurgitation   Final Conclusions:   1. No significant CAD 2. 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 Moderate pulmonary HTN.  Recommendations: Medical management.   Theron [facility] 01/11/2013, 2:46 PM

## 2013-01-11 NOTE — Progress Notes (Signed)
Pt back from cathlab.  Right groin is a level 0.  Pt instructed to keep leg straight and to remain on bedrest.  Pt verbalized understanding.

## 2013-01-11 NOTE — Progress Notes (Signed)
Hypokalemia   K replaced  

## 2013-01-11 NOTE — Progress Notes (Addendum)
Patient ID: Deanna Brown, female   DOB: 01/14/1946, 67 y.o.   MRN: [id number]   I have reviewed all the admission data. Patient has been stable during the evening. She is n.p.o. I spoke to the cath lab to make plans for her to be cathed today as the morning goes on. She is not having chest pain. I have written orders. I've spoken with the nurse about the plan.  Jerral Bonito, MD   I have spoken in person with the patient and her son. I described the procedure and discuss risks and benefits.  Jerral Bonito, MD

## 2013-01-11 NOTE — Progress Notes (Signed)
ANTICOAGULATION CONSULT NOTE - Initial Consult  Pharmacy Consult for IV Heparin Indication: chest pain/ACS  Allergies  Allergen Reactions  . Cetirizine Hcl Itching    Patient Measurements: Height: 5\' 1"  (154.9 cm) Weight: 110 lb 6.4 oz (50.077 kg) IBW/kg (Calculated) : 47.8 Heparin Dosing Weight: 53.5 kg  Vital Signs: Temp: 98.9 F (37.2 C) (06/10 1205) Temp src: Oral (06/10 1205) BP: 104/71 mmHg (06/10 1205)  Labs:  Recent Labs  01/10/13 1857 01/10/13 1940  01/10/13 2336 01/11/13 0200 01/11/13 1102  HGB 15.1*  --   --   --  13.2  --   HCT 42.0  --   --   --  36.8  --   PLT 393  --   --   --  340  --   APTT  --  35  --   --   --   --   LABPROT  --  13.4  --   --   --   --   INR  --  1.03  --   --   --   --   HEPARINUNFRC  --   --   --   --  0.26* 0.28*  CREATININE 0.73  --   --   --  0.65  --   TROPONINI  --   --   < > 11.71* 10.66* 7.96*  < > = values in this interval not displayed.  Estimated Creatinine Clearance: 52.2 ml/min (by C-G formula based on Cr of 0.65).   Medical History: Past Medical History  Diagnosis Date  . Hypertension   . Asthma   . Seasonal allergies   . High cholesterol   . GERD (gastroesophageal reflux disease)   . Cancer     Medications:  . [START ON 01/12/2013] aspirin EC  81 mg Oral Daily  . atorvastatin  40 mg Oral q1800  . irbesartan  300 mg Oral Daily   And  . hydrochlorothiazide  25 mg Oral Daily  . loratadine  10 mg Oral Daily  . metoprolol tartrate  25 mg Oral BID  . mometasone-formoterol  2 puff Inhalation BID  . pantoprazole  20 mg Oral Daily  . sodium chloride  3 mL Intravenous Q12H    Infusions:  . sodium chloride 50 mL/hr at 01/11/13 0854  . heparin Stopped (01/11/13 1337)    Assessment: 53 yoF presenting with elevated HR and high troponin on heparin per pharmacy for NSTEMI.  CBC with slight drop today, platelet count stable.  Heparin level slightly subtherapeutic on heparin 750 units/hr.  No bleeding or  complications noted.  Heparin was just turned off in preparation for cath lab.  Goal of Therapy:  Heparin level 0.3-0.7 units/ml Monitor platelets by anticoagulation protocol: Yes   Plan:  1.  Will f/u plans for further anticoagulation after cath lab.  Tad Moore, BCPS  Clinical Pharmacist Pager [phone]  01/11/2013 1:44 PM

## 2013-01-11 NOTE — Progress Notes (Signed)
ANTICOAGULATION CONSULT NOTE - Follow Up Consult  Pharmacy Consult for heparin Indication: chest pain/ACS  Labs:  Recent Labs  01/10/13 1857 01/10/13 1940 01/10/13 2000 01/10/13 2336 01/11/13 0200  HGB 15.1*  --   --   --  13.2  HCT 42.0  --   --   --  36.8  PLT 393  --   --   --  340  APTT  --  35  --   --   --   LABPROT  --  13.4  --   --   --   INR  --  1.03  --   --   --   HEPARINUNFRC  --   --   --   --  0.26*  CREATININE 0.73  --   --   --   --   TROPONINI  --   --  13.69* 11.71*  --     Assessment: 67yo female slightly subtherapeutic on heparin with initial dosing for ACS.  Goal of Therapy:  Heparin level 0.3-0.7 units/ml   Plan:  Will increase heparin gtt by 2 units/hr/hr to 750 units/hr and check level with next troponin.  Vernard Gambles, PharmD, BCPS  01/11/2013,2:39 AM

## 2013-01-11 NOTE — Progress Notes (Signed)
MD aware  trop 11.71 trending down from 13.69.  Pt resting.  Vs stable. Will continue to monitor. Emilie [place] Liter

## 2013-01-12 ENCOUNTER — Inpatient Hospital Stay (HOSPITAL_COMMUNITY): Payer: Medicare Other

## 2013-01-12 ENCOUNTER — Encounter (HOSPITAL_COMMUNITY): Payer: Self-pay | Admitting: Physician Assistant

## 2013-01-12 DIAGNOSIS — I5181 Takotsubo syndrome: Principal | ICD-10-CM

## 2013-01-12 DIAGNOSIS — I369 Nonrheumatic tricuspid valve disorder, unspecified: Secondary | ICD-10-CM

## 2013-01-12 LAB — BASIC METABOLIC PANEL
BUN: 9 mg/dL (ref 6–23)
CO2: 26 mEq/L (ref 19–32)
Calcium: 8.7 mg/dL (ref 8.4–10.5)
Chloride: 96 mEq/L (ref 96–112)
Creatinine, Ser: 0.75 mg/dL (ref 0.50–1.10)
GFR calc Af Amer: >90 mL/min (ref >90)
GFR calc non Af Amer: 86 mL/min — ABNORMAL LOW (ref >90)
Glucose, Bld: 141 mg/dL — ABNORMAL HIGH (ref 70–99)
Potassium: 3.1 mEq/L — ABNORMAL LOW (ref 3.5–5.1)
Sodium: 132 mEq/L — ABNORMAL LOW (ref 135–145)

## 2013-01-12 LAB — URINALYSIS, ROUTINE W REFLEX MICROSCOPIC
Bilirubin Urine: NEGATIVE
Glucose, UA: NEGATIVE mg/dL
Hgb urine dipstick: NEGATIVE
Ketones, ur: NEGATIVE mg/dL
Nitrite: NEGATIVE
Protein, ur: NEGATIVE mg/dL
Specific Gravity, Urine: 1.008 (ref 1.005–1.030)
Urobilinogen, UA: 0.2 mg/dL (ref 0.0–1.0)
pH: 7.5 (ref 5.0–8.0)

## 2013-01-12 LAB — CBC
HCT: 38.5 % (ref 36.0–46.0)
Hemoglobin: 13.2 g/dL (ref 12.0–15.0)
MCH: 28.3 pg (ref 26.0–34.0)
MCHC: 34.3 g/dL (ref 30.0–36.0)
MCV: 82.6 fL (ref 78.0–100.0)
Platelets: 307 10*3/uL (ref 150–400)
RBC: 4.66 MIL/uL (ref 3.87–5.11)
RDW: 13.5 % (ref 11.5–15.5)
WBC: 11 10*3/uL — ABNORMAL HIGH (ref 4.0–10.5)

## 2013-01-12 LAB — PRO B NATRIURETIC PEPTIDE: Pro B Natriuretic peptide (BNP): 33098 pg/mL — ABNORMAL HIGH (ref 0–125)

## 2013-01-12 LAB — URINE MICROSCOPIC-ADD ON

## 2013-01-12 LAB — T4, FREE: Free T4: 0.85 ng/dL (ref 0.80–1.80)

## 2013-01-12 LAB — MAGNESIUM: Magnesium: 1.9 mg/dL (ref 1.5–2.5)

## 2013-01-12 LAB — TSH: TSH: 0.764 u[IU]/mL (ref 0.350–4.500)

## 2013-01-12 LAB — D-DIMER, QUANTITATIVE: D-Dimer, Quant: 0.85 ug/mL-FEU — ABNORMAL HIGH (ref 0.00–0.48)

## 2013-01-12 MED ORDER — CARVEDILOL 12.5 MG PO TABS
12.5000 mg | ORAL_TABLET | Freq: Two times a day (BID) | ORAL | Status: DC
  Administered 2013-01-12 (×2): 12.5 mg via ORAL
  Filled 2013-01-12 (×5): qty 1

## 2013-01-12 MED ORDER — POTASSIUM CHLORIDE CRYS ER 20 MEQ PO TBCR: 40.0000 meq | EXTENDED_RELEASE_TABLET | Freq: Once | ORAL | Status: DC

## 2013-01-12 MED ORDER — POTASSIUM CHLORIDE CRYS ER 20 MEQ PO TBCR
40.0000 meq | EXTENDED_RELEASE_TABLET | Freq: Once | ORAL | Status: AC
  Administered 2013-01-12: 40 meq via ORAL
  Filled 2013-01-12: qty 1

## 2013-01-12 MED ORDER — FUROSEMIDE 10 MG/ML IJ SOLN: 40.0000 mg | Freq: Once | INTRAMUSCULAR | Status: AC

## 2013-01-12 MED ORDER — POTASSIUM CHLORIDE CRYS ER 20 MEQ PO TBCR
40.0000 meq | EXTENDED_RELEASE_TABLET | Freq: Once | ORAL | Status: AC
  Administered 2013-01-12: 40 meq via ORAL
  Filled 2013-01-12: qty 2

## 2013-01-12 MED ORDER — POTASSIUM CHLORIDE CRYS ER 20 MEQ PO TBCR
EXTENDED_RELEASE_TABLET | ORAL | Status: AC
  Filled 2013-01-12: qty 2

## 2013-01-12 MED ORDER — FUROSEMIDE 10 MG/ML IJ SOLN
INTRAMUSCULAR | Status: AC
  Administered 2013-01-12: 40 mg
  Filled 2013-01-12: qty 4

## 2013-01-12 NOTE — Progress Notes (Signed)
pBNP returned at 33k, d-dimer 0.85. D/w Dr. Myrtis Ser. We will give 1 dose of IV Lasix 40mg  and assess clinical response. Will hold off on CTA at the moment due to dye load but if RV appears big on echo will need to proceed sooner. Echo notified to do patient next. KCl also ordered. Felesia Stahlecker PA-C

## 2013-01-12 NOTE — Progress Notes (Addendum)
Patient: Deanna Brown / Admit Date: 01/10/2013 / Date of Encounter: 01/12/2013, 9:35 AM   Subjective  Denies any CP or SOB. Asymptomatic.   Objective   Telemetry: Sinus tach 110-120s, occ PVCs, intermittent ventricular bigeminy Physical Exam: Filed Vitals:   01/12/13 0900  BP: 109/73  Pulse: 123  Temp: 98.9  Resp: 22   General: Well developed thin AAF in no acute distress. Head: Normocephalic, atraumatic, sclera non-icteric, no xanthomas, nares are without discharge. Neck: Negative for carotid bruits. JVD not elevated. Lungs: Diminished BS at bases, otherwise no wheezes, rales, or rhonchi. Breathing is unlabored. Heart: RRR S1 S2 without murmurs, rubs, or gallops.  Abdomen: Soft, non-tender, non-distended with normoactive bowel sounds. No hepatomegaly. No rebound/guarding. No obvious abdominal masses. Msk:  Strength and tone appear normal for age. Extremities: No clubbing or cyanosis. No edema.  Distal pedal pulses are 2+ and equal bilaterally. Groin site still with pressure dressing in place. Neuro: Alert and oriented X 3. Moves all extremities spontaneously. Psych:  Responds to questions appropriately with a normal affect.    Intake/Output Summary (Last 24 hours) at 01/12/13 0935 Last data filed at 01/12/13 0800  Gross per 24 hour  Intake  970.6 ml  Output    600 ml  Net  370.6 ml    Inpatient Medications:  . aspirin EC  81 mg Oral Daily  . atorvastatin  40 mg Oral q1800  . carvedilol  12.5 mg Oral BID WC  . heparin  5,000 Units Subcutaneous Q8H  . irbesartan  300 mg Oral Daily  . loratadine  10 mg Oral Daily  . mometasone-formoterol  2 puff Inhalation BID  . pantoprazole  20 mg Oral Daily    Labs:  Recent Labs  01/10/13 1857 01/11/13 0200 01/11/13 1102 01/11/13 1635  NA 131* 133*  --   --   K 3.1* 2.8* 3.6  --   CL 93* 99  --   --   CO2 26 23  --   --   GLUCOSE 111* 142*  --   --   BUN 10 10  --   --   CREATININE 0.73 0.65  --  0.74  CALCIUM 9.9  8.8  --   --     Recent Labs  01/10/13 1857  AST 50*  ALT 15  ALKPHOS 68  BILITOT 0.6  PROT 8.6*  ALBUMIN 3.5    Recent Labs  01/10/13 1857  01/11/13 1635 01/12/13 0452  WBC 12.2*  < > 11.3* 11.0*  NEUTROABS 8.8*  --   --   --   HGB 15.1*  < > 13.5 13.2  HCT 42.0  < > 39.0 38.5  MCV 80.6  < > 82.1 82.6  PLT 393  < > 345 307  < > = values in this interval not displayed.  Recent Labs  01/10/13 2000 01/10/13 2336 01/11/13 0200 01/11/13 1102  TROPONINI 13.69* 11.71* 10.66* 7.96*   No components found with this basename: POCBNP,   Recent Labs  01/10/13 2336  HGBA1C 6.2*     Radiology/Studies:  Dg Chest 2 View  01/10/2013   *RADIOLOGY REPORT*  Clinical Data: Tachycardia.  Positive troponin.  History of hypertension.  CHEST - 2 VIEW  Comparison: 08/12/2012  Findings: Mild cardiac enlargement with mild pulmonary vascular congestion, developing since previous study.  No significant edema. Focal area of infiltration in the left upper lung may represent superimposed pneumonia or vascular crowding.  Underlying diffuse emphysematous changes.  Small bilateral  pleural effusions.  IMPRESSION: Cardiac enlargement with pulmonary vascular congestion and small pleural effusions.  Superimposed infiltration in the left upper lung.  Underlying emphysematous changes.   Original Report Authenticated By: Burman Nieves, M.D.     Assessment and Plan  1. NSTEMI with cath 01/11/13 showing no significant CAD, severe LV dysfunction EF 25% consistent with Takotsubo cardiomyopathy - 2D Echo pending (have asked nursing to call dept to come do this today). She is still tachycardic but asymptomatic. PO2 was low on ABG yesterday. Per d/w Dr. Myrtis Ser, will obtain port CXR and change metoprolol to Coreg 12.5mg  BID. We will d/c HCTZ component of ARB/HCTZ combo in case we need to shift to Lasix this morning. Will also check pBNP. Will need f/u echo as outpatient to assess for improvement. 2. Sinus tachycardia -  this was her presenting complaint. HR was 74 in 08/2012. Per d/w Dr. Myrtis Ser, check d-dimer in light of #3 and NSTEMI. Obtain CXR. Check TSH and free T4. Adjusting BB as above. 3.  Moderate pulmonary HTN - noted by cath. Echo pending. 4. HTN - Higher pressures since admit may be related to discontinuation of clonidine upon admission. BP currently stable at 109/73. Changes as above. 5. Reported h/o hyperlipidemia - LDL 94, not on statin at home. Will defer to MD if this should be continued. 6. Hypokalemia - noted on admission. Check BMET today. 7. PVCs - BB as above. Check BMET, Mg.  Signed, Ronie Spies PA-C Patient seen and examined. I agree with the assessment and plan as detailed above. See also my additional thoughts below.   Patient had significant troponin elevation with her Takotsubo event. She had significant left ventricular dysfunction in the Cath Lab. I'm concerned about her sinus tachycardia this morning. By physical exam she does not appear to be volume overloaded. Chest x-ray does not show signs of CHF. The plan will be to treat by increasing her beta blocker. She now needs to be treated as a patient with left ventricular dysfunction and sinus tachycardia. We will try to use appropriate medications for her left ventricular dysfunction. When she has clearly stabilized, she will be able to go home with early post hospital followup.  Active Problems:   Takotsubo syndrome   Takotsubo cardiomyopathy  15:15...Marland KitchenMarland KitchenMarland Kitchen I have reviewed the patient's echo done today. There is motion at the base was severe decreased motion of the mid ventricle and apex. This is consistent with Takotsubo. However she also has some right ventricular dysfunction. Her right heart is not dilated. Her d-dimer was slightly elevated. I've chosen not to proceed with further testing concerning the issue of pulmonary emboli. She tells me she had some shortness of breath before this event occurred. In some ways I wonder if she doesn't  have a different type of cardiomyopathy. However she presented with significant troponin elevation. Therefore the working diagnosis continues to be to Takotsubo syndrome.  Willa Rough, MD, [facility] 01/12/2013 10:48 AM

## 2013-01-12 NOTE — Progress Notes (Signed)
Noted +ddimer and HR. Will hold ambulation today. Ethelda Chick CES, ACSM 2:38 PM 01/12/2013

## 2013-01-12 NOTE — Progress Notes (Signed)
*  PRELIMINARY RESULTS* Echocardiogram 2D Echocardiogram has been performed.  Jeryl Columbia 01/12/2013, 2:51 PM

## 2013-01-13 LAB — COMPREHENSIVE METABOLIC PANEL
ALT: 9 U/L (ref 0–35)
AST: 21 U/L (ref 0–37)
Albumin: 2.6 g/dL — ABNORMAL LOW (ref 3.5–5.2)
Alkaline Phosphatase: 58 U/L (ref 39–117)
BUN: 14 mg/dL (ref 6–23)
CO2: 27 mEq/L (ref 19–32)
Calcium: 8.4 mg/dL (ref 8.4–10.5)
Chloride: 98 mEq/L (ref 96–112)
Creatinine, Ser: 0.91 mg/dL (ref 0.50–1.10)
GFR calc Af Amer: 75 mL/min — ABNORMAL LOW (ref >90)
GFR calc non Af Amer: 64 mL/min — ABNORMAL LOW (ref >90)
Glucose, Bld: 100 mg/dL — ABNORMAL HIGH (ref 70–99)
Potassium: 4.2 mEq/L (ref 3.5–5.1)
Sodium: 132 mEq/L — ABNORMAL LOW (ref 135–145)
Total Bilirubin: 0.9 mg/dL (ref 0.3–1.2)
Total Protein: 6.7 g/dL (ref 6.0–8.3)

## 2013-01-13 LAB — CBC
HCT: 34.6 % — ABNORMAL LOW (ref 36.0–46.0)
Hemoglobin: 12.1 g/dL (ref 12.0–15.0)
MCH: 28.5 pg (ref 26.0–34.0)
MCHC: 35 g/dL (ref 30.0–36.0)
MCV: 81.6 fL (ref 78.0–100.0)
Platelets: 286 10*3/uL (ref 150–400)
RBC: 4.24 MIL/uL (ref 3.87–5.11)
RDW: 13.3 % (ref 11.5–15.5)
WBC: 9.6 10*3/uL (ref 4.0–10.5)

## 2013-01-13 MED ORDER — CARVEDILOL 25 MG PO TABS
25.0000 mg | ORAL_TABLET | Freq: Two times a day (BID) | ORAL | Status: DC
  Administered 2013-01-13 – 2013-01-14 (×2): 25 mg via ORAL
  Filled 2013-01-13 (×4): qty 1

## 2013-01-13 NOTE — Progress Notes (Signed)
transferred to 3w41 by whellchair, stable, belongings with son, report given to RN.

## 2013-01-13 NOTE — Progress Notes (Signed)
CARDIAC REHAB PHASE I   PRE:  Rate/Rhythm: 100SR  BP:  Supine:   Sitting: 111/72  Standing:    SaO2: 97%RA  MODE:  Ambulation: 550 ft   POST:  Rate/Rhythm: 112  BP:  Supine:   Sitting: 93/67  Standing:    SaO2: 98%RA 1358-1510 Pt walked 550 ft with hand held asst with steady gait. Denied CP or dizziness. Education completed re CHF,MI, takot sub,low sodium diet, exercise guidelines,NTG use. Pt voiced understanding. Discussed CRP 2 and pt gave permission to refer to [place] Phase 2.   Luetta Nutting, RN BSN  01/13/2013 3:05 PM

## 2013-01-13 NOTE — Progress Notes (Signed)
Patient: Deanna Brown / Admit Date: 01/10/2013 / Date of Encounter: 01/13/2013, 8:28 AM   Subjective  Ms. Monaco is a 67 yo admitted with Takotsubo cardiomyopathy.  She is improving.  Hr is still a bit fast.    Objective   Telemetry: Sinus tach 110-120s, occ PVCs, intermittent ventricular bigeminy Physical Exam: Filed Vitals:   01/12/13 0900  BP: 109/73  Pulse: 123  Temp: 98.9  Resp: 22   General: Well developed thin AAF in no acute distress. Head: Normocephalic, atraumatic, sclera non-icteric, no xanthomas, nares are without discharge. Neck: Negative for carotid bruits. JVD not elevated. Lungs: Diminished BS at bases, otherwise no wheezes, rales, or rhonchi. Breathing is unlabored. Heart: RRR S1 S2 without murmurs, rubs, or gallops.  Abdomen: Soft, non-tender, non-distended with normoactive bowel sounds. No hepatomegaly. No rebound/guarding. No obvious abdominal masses. Msk:  Strength and tone appear normal for age. Extremities: No clubbing or cyanosis. No edema.  Distal pedal pulses are 2+ and equal bilaterally. Groin site is ok Neuro: Alert and oriented X 3. Moves all extremities spontaneously. Psych:  Responds to questions appropriately with a normal affect.    Intake/Output Summary (Last 24 hours) at 01/13/13 0828 Last data filed at 01/13/13 0400  Gross per 24 hour  Intake    200 ml  Output   1150 ml  Net   -950 ml    Inpatient Medications:  . aspirin EC  81 mg Oral Daily  . atorvastatin  40 mg Oral q1800  . carvedilol  25 mg Oral BID WC  . heparin  5,000 Units Subcutaneous Q8H  . irbesartan  300 mg Oral Daily  . loratadine  10 mg Oral Daily  . mometasone-formoterol  2 puff Inhalation BID  . pantoprazole  20 mg Oral Daily    Labs:  Recent Labs  01/11/13 0200  01/12/13 0942 01/13/13 0420  NA 133*  --  132* 132*  K 2.8*  < > 3.1* 4.2  CL 99  --  96 98  CO2 23  --  26 27  GLUCOSE 142*  --  141* 100*  BUN 10  --  9 14  CREATININE 0.65  < > 0.75 0.91    CALCIUM 8.8  --  8.7 8.4  MG  --   --  1.9  --   < > = values in this interval not displayed.  Recent Labs  01/10/13 1857 01/13/13 0420  AST 50* 21  ALT 15 9  ALKPHOS 68 58  BILITOT 0.6 0.9  PROT 8.6* 6.7  ALBUMIN 3.5 2.6*    Recent Labs  01/10/13 1857  01/12/13 0452 01/13/13 0420  WBC 12.2*  < > 11.0* 9.6  NEUTROABS 8.8*  --   --   --   HGB 15.1*  < > 13.2 12.1  HCT 42.0  < > 38.5 34.6*  MCV 80.6  < > 82.6 81.6  PLT 393  < > 307 286  < > = values in this interval not displayed.  Recent Labs  01/10/13 2000 01/10/13 2336 01/11/13 0200 01/11/13 1102  TROPONINI 13.69* 11.71* 10.66* 7.96*   No components found with this basename: POCBNP,   Recent Labs  01/10/13 2336  HGBA1C 6.2*     Radiology/Studies:  Dg Chest 2 View  01/10/2013   *RADIOLOGY REPORT*  Clinical Data: Tachycardia.  Positive troponin.  History of hypertension.  CHEST - 2 VIEW  Comparison: 08/12/2012  Findings: Mild cardiac enlargement with mild pulmonary vascular congestion, developing since  previous study.  No significant edema. Focal area of infiltration in the left upper lung may represent superimposed pneumonia or vascular crowding.  Underlying diffuse emphysematous changes.  Small bilateral pleural effusions.  IMPRESSION: Cardiac enlargement with pulmonary vascular congestion and small pleural effusions.  Superimposed infiltration in the left upper lung.  Underlying emphysematous changes.   Original Report Authenticated By: Burman Nieves, M.D.     Assessment and Plan  1. NSTEMI / takotsubo syndrome.  She is doing well on medical therapy.  Will increase her coreg to 25 bid to slow HR further.  Transfer to tele.  Cardiac rehab.  She may be able to go home tomorrow.    3.  Moderate pulmonary HTN - noted by cath. Echo pending. 4. HTN - BP is OK   5. Reported h/o hyperlipidemia - LDL 94, not on statin at home.  6. Hypokalemia - noted on admission. Check BMET today. 7. PVCs - BB as above. Check  BMET, Mg.    Alvia Grove., MD, [facility] 01/13/2013, 8:30 AM Office - [phone] Pager [phone]

## 2013-01-14 ENCOUNTER — Encounter (HOSPITAL_COMMUNITY): Payer: Self-pay | Admitting: Physician Assistant

## 2013-01-14 LAB — CBC
HCT: 33.9 % — ABNORMAL LOW (ref 36.0–46.0)
Hemoglobin: 11.8 g/dL — ABNORMAL LOW (ref 12.0–15.0)
MCH: 28.7 pg (ref 26.0–34.0)
MCHC: 34.8 g/dL (ref 30.0–36.0)
MCV: 82.5 fL (ref 78.0–100.0)
Platelets: 297 10*3/uL (ref 150–400)
RBC: 4.11 MIL/uL (ref 3.87–5.11)
RDW: 13.4 % (ref 11.5–15.5)
WBC: 7.3 10*3/uL (ref 4.0–10.5)

## 2013-01-14 MED ORDER — IRBESARTAN 300 MG PO TABS: 300.0000 mg | ORAL_TABLET | Freq: Every day | ORAL | Status: DC

## 2013-01-14 MED ORDER — CARVEDILOL 25 MG PO TABS: 25.0000 mg | ORAL_TABLET | Freq: Two times a day (BID) | ORAL | Status: DC

## 2013-01-14 MED ORDER — ATORVASTATIN CALCIUM 10 MG PO TABS: 10.0000 mg | ORAL_TABLET | Freq: Every day | ORAL | Status: DC

## 2013-01-14 MED ORDER — ASPIRIN 81 MG PO TBEC: 81.0000 mg | DELAYED_RELEASE_TABLET | Freq: Every day | ORAL | Status: DC

## 2013-01-14 NOTE — Progress Notes (Signed)
Patient: Deanna Brown / Admit Date: 01/10/2013 / Date of Encounter: 01/14/2013, 2:04 PM   Subjective  Ms. Bradt is a 67 yo admitted with Takotsubo cardiomyopathy.  She is improving.  She has walked with cardiac rehab and has done well.   Objective   Telemetry: Sinus tach 110-120s, occ PVCs, intermittent ventricular bigeminy Physical Exam: Filed Vitals:   01/12/13 0900  BP: 109/73  Pulse: 123  Temp: 98.9  Resp: 22   General: Well developed thin AAF in no acute distress. Head: Normocephalic, atraumatic, sclera non-icteric, no xanthomas, nares are without discharge. Neck: Negative for carotid bruits. JVD not elevated. Lungs: Diminished BS at bases, otherwise no wheezes, rales, or rhonchi. Breathing is unlabored. Heart: RRR S1 S2 without murmurs, rubs, or gallops.  Abdomen: Soft, non-tender, non-distended with normoactive bowel sounds. No hepatomegaly. No rebound/guarding. No obvious abdominal masses. Msk:  Strength and tone appear normal for age. Extremities: No clubbing or cyanosis. No edema.  Distal pedal pulses are 2+ and equal bilaterally. Groin site is ok Neuro: Alert and oriented X 3. Moves all extremities spontaneously. Psych:  Responds to questions appropriately with a normal affect.    Intake/Output Summary (Last 24 hours) at 01/14/13 1404 Last data filed at 01/14/13 0900  Gross per 24 hour  Intake    480 ml  Output      0 ml  Net    480 ml    Inpatient Medications:  . aspirin EC  81 mg Oral Daily  . atorvastatin  40 mg Oral q1800  . carvedilol  25 mg Oral BID WC  . heparin  5,000 Units Subcutaneous Q8H  . irbesartan  300 mg Oral Daily  . loratadine  10 mg Oral Daily  . mometasone-formoterol  2 puff Inhalation BID  . pantoprazole  20 mg Oral Daily    Labs:  Recent Labs  01/12/13 0942 01/13/13 0420  NA 132* 132*  K 3.1* 4.2  CL 96 98  CO2 26 27  GLUCOSE 141* 100*  BUN 9 14  CREATININE 0.75 0.91  CALCIUM 8.7 8.4  MG 1.9  --     Recent Labs  01/13/13 0420  AST 21  ALT 9  ALKPHOS 58  BILITOT 0.9  PROT 6.7  ALBUMIN 2.6*    Recent Labs  01/13/13 0420 01/14/13 0330  WBC 9.6 7.3  HGB 12.1 11.8*  HCT 34.6* 33.9*  MCV 81.6 82.5  PLT 286 297   No results found for this basename: CKTOTAL, CKMB, TROPONINI,  in the last 72 hours No components found with this basename: POCBNP,  No results found for this basename: HGBA1C,  in the last 72 hours   Radiology/Studies:  Dg Chest 2 View  01/10/2013   *RADIOLOGY REPORT*  Clinical Data: Tachycardia.  Positive troponin.  History of hypertension.  CHEST - 2 VIEW  Comparison: 08/12/2012  Findings: Mild cardiac enlargement with mild pulmonary vascular congestion, developing since previous study.  No significant edema. Focal area of infiltration in the left upper lung may represent superimposed pneumonia or vascular crowding.  Underlying diffuse emphysematous changes.  Small bilateral pleural effusions.  IMPRESSION: Cardiac enlargement with pulmonary vascular congestion and small pleural effusions.  Superimposed infiltration in the left upper lung.  Underlying emphysematous changes.   Original Report Authenticated By: Burman Nieves, M.D.     Assessment and Plan  1. NSTEMI / takotsubo syndrome.  She is doing well on medical therapy.  Continue current meds.  Home today.  3.  Moderate pulmonary  HTN - noted by cath. . 4. HTN - BP is OK   5. Reported h/o hyperlipidemia - LDL 94,    6. Hypokalemia - noted on admission. Check BMET today. 7. PVCs - BB as above. Check BMET, Mg.    Alvia Grove., MD, [facility] 01/14/2013, 2:04 PM Office - [phone] Pager [phone]

## 2013-01-14 NOTE — Discharge Summary (Signed)
Discharge Summary   Patient ID: Deanna Brown MRN: [id number], DOB/AGE: Jul 02, 1946 67 y.o. Admit date: 01/10/2013 D/C date:     01/14/2013  Primary Cardiologist: Myrtis Ser  Primary Discharge Diagnoses:  1. 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 Moderate pulm HTN noted by cath 3. HTN 4. Hypokalemia 5. PVCs  Secondary Discharge Diagnoses:  1. GERD 2. H/o HLD 3. Uterine cancer 4. Asthma 5. Seasonal allergies  Hospital Course: Deanna Brown is a 67 y/o F with history of HTN, GERD who presented to [facility] 01/10/13 with complaints off fast HR. Interestingly, she had no other complaints. She routinely checks her BP and happened to note that her pulse was in the 130s. She called her PCP who referred her to the ER. She denied any other symptoms including chest pain, pressure, SOB, DOE, headache, back pain, cough, LE swelling, orthopnea. No fever, chills, nausea, vomiting, abdominal pain. In the ED her first troponin was 8. She was given ASA, heparin gtt, and KCl for K of 3.1. EKG showed sinus tachycardia with borderline criteria for LVH, poor R wave progression, significant TWI in V4-V6, II and AVF. Due to concern of possibly being dehydrated leading to tachycardia, she was given 1 L NS. She was transfer to [facility] for cardiac admission. Troponin peaked at 13.69 but again in absence of any CP or SOB. She was made NPO and underwent cardiac cath the following day showing no significant CAD (20% prox RCA), but severe LV dysfunction in a pattern consistent with Takotsubo cardiomyopathy. There was severe hypokinesis/akinesis of the mid to distal anterior wall and inferior wall, there is severe hypokinesis of the apex, there is sparing of the base. She had moderate pulm HTN. Medical therapy was recommended. She was started on BB therapy which was titrated due to her sinus tachycardia. The ARB component of her prior combo pill was continued. TSH, free T4  were normal. She was given 1 dose of IV Lasix. With this plus uptitration of her BB, she improve clinically by vitals and HR came down. She remained afebrile. CXR showed diminished aeration to the left base which may represent atelectasis or early consolidation, but she clinically did not have any evidence of pneumonia. 2D Echo showed EF 25-30%, motion at the base but severe HK in all other segments consistent with Takotsubo cardiomyopathy. There was mild/moderate RV dysfunction but the RV was not dilated. In the absence of any SOB, chest pain, LEE, or exam sequelae of DVT, PE was felt unlikely. O2 sat is normal on RA today. She also improved with treatment for CHF/LV dysfunction thus further workup for this was not pursued. She has ambulated well with cardiac rehab. Dr. Elease Hashimoto has recommended ASA 81mg  and Lipitor 10mg  daily (LDL 94 not on therapy prior to admission) - continuation of these medications will be deferred to his primary cardiologist. Dr. Elease Hashimoto has seen and examined her today and feels she is stable for discharge.    Discharge Vitals: Blood pressure 103/70, pulse 87, temperature 98.4 F (36.9 C), temperature source Oral, resp. rate 18, height 5\' 1"  (1.549 m), weight 104 lb 4.8 oz (47.31 kg), SpO2 97.00%.  Labs: Lab Results  Component Value Date   WBC 7.3 01/14/2013   HGB 11.8* 01/14/2013   HCT 33.9* 01/14/2013   MCV 82.5 01/14/2013   PLT 297 01/14/2013     Recent Labs Lab 01/13/13 0420  NA 132*  K 4.2  CL 98  CO2 27  BUN 14  CREATININE 0.91  CALCIUM 8.4  PROT 6.7  BILITOT 0.9  ALKPHOS 58  ALT 9  AST 21  GLUCOSE 100*    Lab Results  Component Value Date   CHOL 159 01/11/2013   HDL 52 01/11/2013   LDLCALC 94 01/11/2013   TRIG 65 01/11/2013   Lab Results  Component Value Date   DDIMER 0.85* 01/12/2013    Diagnostic Studies/Procedures   Dg Chest 2 View6/04/2013   *RADIOLOGY REPORT*  Clinical Data: Tachycardia.  Positive troponin.  History of hypertension.  CHEST - 2 VIEW   Comparison: 08/12/2012  Findings: Mild cardiac enlargement with mild pulmonary vascular congestion, developing since previous study.  No significant edema. Focal area of infiltration in the left upper lung may represent superimposed pneumonia or vascular crowding.  Underlying diffuse emphysematous changes.  Small bilateral pleural effusions.  IMPRESSION: Cardiac enlargement with pulmonary vascular congestion and small pleural effusions.  Superimposed infiltration in the left upper lung.  Underlying emphysematous changes.   Original Report Authenticated By: Burman Nieves, M.D.   Dg Chest Port 1 View6/06/2013   *RADIOLOGY REPORT*  Clinical Data: Hypoxia.  Tachycardia.  PORTABLE CHEST - 1 VIEW  Comparison: 01/10/2013  Findings: Heart size is normal.  There is no pleural effusion or edema.  There is asymmetric decreased aeration to the left base.  The visualized osseous structures appear intact.  IMPRESSION:  1. Diminished aeration to the left base which may represent atelectasis or early consolidation.   Original Report Authenticated By: Signa Kell, M.D.   2D Echo 01/12/13 - Left ventricle: There is motion at the base. There is severe hypokinesis in all of the other segments. The motion is c/w Takotsubo. The cavity size was normal. Wall thickness was increased in a pattern of moderate LVH. Systolic function was severely reduced. The estimated ejection fraction was in the range of 25% to 30%. - Right ventricle: There is mild/moderate RV dysfunction, but the RV is not dilated. - Pulmonary arteries: PA peak pressure: 37mm Hg (S).  Cardiac catheterization this admission, please see full report and above for summary.   Discharge Medications     Medication List    STOP taking these medications       cloNIDine 0.1 MG tablet  Commonly known as:  CATAPRES     valsartan-hydrochlorothiazide 320-25 MG per tablet  Commonly known as:  DIOVAN-HCT      TAKE these medications       alendronate 70  MG tablet  Commonly known as:  FOSAMAX  Take 70 mg by mouth every 7 (seven) days. Take with a full glass of water on an empty stomach.     aspirin 81 MG EC tablet  Take 1 tablet (81 mg total) by mouth daily.     atorvastatin 10 MG tablet  Commonly known as:  LIPITOR  Take 1 tablet (10 mg total) by mouth daily.     carvedilol 25 MG tablet  Commonly known as:  COREG  Take 1 tablet (25 mg total) by mouth 2 (two) times daily with a meal.     fluticasone 27.5 MCG/SPRAY nasal spray  Commonly known as:  VERAMYST  Place 2 sprays into the nose daily.     fluticasone-salmeterol 115-21 MCG/ACT inhaler  Commonly known as:  ADVAIR HFA  Inhale 2 puffs into the lungs 2 (two) times daily.     irbesartan 300 MG tablet  Commonly known as:  AVAPRO  Take 1 tablet (300 mg total)  by mouth daily.     loratadine 10 MG tablet  Commonly known as:  CLARITIN  Take 10 mg by mouth daily.     multivitamin-iron-minerals-folic acid chewable tablet  Chew 1 tablet by mouth daily.     pantoprazole 20 MG tablet  Commonly known as:  PROTONIX  Take 20 mg by mouth daily.        Disposition   The patient will be discharged in stable condition to home. Discharge Orders   Future Appointments Provider Department Dept Phone   01/19/2013 3:40 PM Beatrice Lecher, PA-C [facility] Main Office [place]) [phone]   Future Orders Complete By Expires     Amb Referral to Cardiac Rehabilitation  As directed     Diet - low sodium heart healthy  As directed     Discharge instructions  As directed     Comments:      One of your heart tests showed weakness of the heart muscle this admission. This may make you more susceptible to weight gain from fluid retention, which can lead to symptoms that we call heart failure.   For patients with congestive heart failure, we give them these special instructions:  1. Follow a low-salt diet and watch your fluid intake. In general, you should not be taking in more than 2  liters of fluid per day (no more than 8 glasses per day). Some patients are restricted to less than 1.5 liters of fluid per day (no more than 6 glasses per day). This includes sources of water in foods like soup, coffee, tea, milk, etc. 2. Weigh yourself on the same scale at same time of day and keep a log. 3. Call your doctor: (Anytime you feel any of the following symptoms)  - 3-4 pound weight gain in 1-2 days or 2 pounds overnight  - Shortness of breath, with or without a dry hacking cough  - Swelling in the hands, feet or stomach  - If you have to sleep on extra pillows at night in order to breathe   IT IS IMPORTANT TO LET YOUR DOCTOR KNOW EARLY ON IF YOU ARE HAVING SYMPTOMS SO WE CAN HELP YOU!    Increase activity slowly  As directed     Scheduling Instructions:      No driving for 1 week. No lifting over 10 lbs for 2 weeks. No sexual activity for 2 weeks. Keep procedure site clean & dry. If you notice increased pain, swelling, bleeding or pus, call/return!  You may shower, but no soaking baths/hot tubs/pools for 1 week.      Follow-up Information   Follow up with Tereso Newcomer, PA-C. (01/19/13 at 3:40pm)    Contact information:   [address] [place] [address] [phone]         Duration of Discharge Encounter: Greater than 30 minutes including physician and PA time.  Signed, Ronie Spies PA-C 01/14/2013, 3:13 PM  Attending Note:   The patient was seen and examined.  Agree with assessment and plan as noted above.  Changes made to the above note as needed.  See note from day of discharge.  Vesta Mixer, Montez Hageman., MD, [facility] 01/21/2013, 2:20 PM

## 2013-01-15 ENCOUNTER — Encounter (HOSPITAL_COMMUNITY): Payer: Self-pay | Admitting: Nurse Practitioner

## 2013-01-15 ENCOUNTER — Observation Stay (HOSPITAL_COMMUNITY)
Admission: EM | Admit: 2013-01-15 | Discharge: 2013-01-18 | Disposition: A | Discharge status: Home / Self Care | Payer: Medicare Other | Attending: Family Medicine | Admitting: Family Medicine

## 2013-01-15 ENCOUNTER — Observation Stay (HOSPITAL_COMMUNITY): Payer: Medicare Other

## 2013-01-15 ENCOUNTER — Emergency Department (HOSPITAL_COMMUNITY): Payer: Medicare Other

## 2013-01-15 DIAGNOSIS — I10 Essential (primary) hypertension: Secondary | ICD-10-CM | POA: Insufficient documentation

## 2013-01-15 DIAGNOSIS — R29898 Other symptoms and signs involving the musculoskeletal system: Secondary | ICD-10-CM | POA: Insufficient documentation

## 2013-01-15 DIAGNOSIS — G459 Transient cerebral ischemic attack, unspecified: Secondary | ICD-10-CM

## 2013-01-15 DIAGNOSIS — I5181 Takotsubo syndrome: Secondary | ICD-10-CM | POA: Insufficient documentation

## 2013-01-15 DIAGNOSIS — E785 Hyperlipidemia, unspecified: Secondary | ICD-10-CM | POA: Insufficient documentation

## 2013-01-15 DIAGNOSIS — K219 Gastro-esophageal reflux disease without esophagitis: Secondary | ICD-10-CM | POA: Insufficient documentation

## 2013-01-15 DIAGNOSIS — I2789 Other specified pulmonary heart diseases: Secondary | ICD-10-CM | POA: Insufficient documentation

## 2013-01-15 DIAGNOSIS — I252 Old myocardial infarction: Secondary | ICD-10-CM | POA: Insufficient documentation

## 2013-01-15 DIAGNOSIS — Z79899 Other long term (current) drug therapy: Secondary | ICD-10-CM | POA: Insufficient documentation

## 2013-01-15 DIAGNOSIS — I214 Non-ST elevation (NSTEMI) myocardial infarction: Secondary | ICD-10-CM | POA: Clinically undetermined

## 2013-01-15 DIAGNOSIS — I6350 Cerebral infarction due to unspecified occlusion or stenosis of unspecified cerebral artery: Principal | ICD-10-CM | POA: Insufficient documentation

## 2013-01-15 DIAGNOSIS — R29810 Facial weakness: Secondary | ICD-10-CM | POA: Insufficient documentation

## 2013-01-15 DIAGNOSIS — R4789 Other speech disturbances: Secondary | ICD-10-CM | POA: Insufficient documentation

## 2013-01-15 HISTORY — DX: Acute myocardial infarction, unspecified: I21.9

## 2013-01-15 LAB — URINALYSIS, ROUTINE W REFLEX MICROSCOPIC
Bilirubin Urine: NEGATIVE
Glucose, UA: NEGATIVE mg/dL
Hgb urine dipstick: NEGATIVE
Ketones, ur: NEGATIVE mg/dL
Leukocytes, UA: NEGATIVE
Nitrite: NEGATIVE
Protein, ur: NEGATIVE mg/dL
Specific Gravity, Urine: 1.009 (ref 1.005–1.030)
Urobilinogen, UA: 1 mg/dL (ref 0.0–1.0)
pH: 7 (ref 5.0–8.0)

## 2013-01-15 LAB — POCT I-STAT TROPONIN I: Troponin i, poc: 0.73 ng/mL (ref 0.00–0.08)

## 2013-01-15 LAB — ETHANOL: Alcohol, Ethyl (B): <11 mg/dL (ref 0–11)

## 2013-01-15 LAB — POCT I-STAT, CHEM 8
BUN: 11 mg/dL (ref 6–23)
Calcium, Ion: 1.31 mmol/L — ABNORMAL HIGH (ref 1.13–1.30)
Chloride: 100 mEq/L (ref 96–112)
Creatinine, Ser: 0.9 mg/dL (ref 0.50–1.10)
Glucose, Bld: 91 mg/dL (ref 70–99)
HCT: 39 % (ref 36.0–46.0)
Hemoglobin: 13.3 g/dL (ref 12.0–15.0)
Potassium: 3.6 mEq/L (ref 3.5–5.1)
Sodium: 134 mEq/L — ABNORMAL LOW (ref 135–145)
TCO2: 27 mmol/L (ref 0–100)

## 2013-01-15 LAB — CBC
HCT: 38.5 % (ref 36.0–46.0)
Hemoglobin: 13.4 g/dL (ref 12.0–15.0)
MCH: 28.7 pg (ref 26.0–34.0)
MCHC: 34.8 g/dL (ref 30.0–36.0)
MCV: 82.4 fL (ref 78.0–100.0)
Platelets: 365 10*3/uL (ref 150–400)
RBC: 4.67 MIL/uL (ref 3.87–5.11)
RDW: 13.3 % (ref 11.5–15.5)
WBC: 5.4 10*3/uL (ref 4.0–10.5)

## 2013-01-15 LAB — RAPID URINE DRUG SCREEN, HOSP PERFORMED
Amphetamines: NOT DETECTED
Barbiturates: NOT DETECTED
Benzodiazepines: NOT DETECTED
Cocaine: NOT DETECTED
Opiates: NOT DETECTED
Tetrahydrocannabinol: NOT DETECTED

## 2013-01-15 LAB — APTT: aPTT: 30 seconds (ref 24–37)

## 2013-01-15 LAB — PROTIME-INR
INR: 0.99 (ref 0.00–1.49)
Prothrombin Time: 13 seconds (ref 11.6–15.2)

## 2013-01-15 LAB — TROPONIN I: Troponin I: 1.15 ng/mL (ref <0.30)

## 2013-01-15 LAB — GLUCOSE, CAPILLARY: Glucose-Capillary: 88 mg/dL (ref 70–99)

## 2013-01-15 MED ORDER — FLUTICASONE FUROATE 27.5 MCG/SPRAY NA SUSP: 2.0000 | Freq: Every day | NASAL | Status: DC

## 2013-01-15 MED ORDER — ASPIRIN EC 325 MG PO TBEC
325.0000 mg | DELAYED_RELEASE_TABLET | Freq: Every day | ORAL | Status: DC
  Filled 2013-01-15: qty 1

## 2013-01-15 MED ORDER — PANTOPRAZOLE SODIUM 40 MG PO TBEC
40.0000 mg | DELAYED_RELEASE_TABLET | Freq: Every day | ORAL | Status: DC
  Administered 2013-01-16 – 2013-01-17 (×2): 40 mg via ORAL
  Filled 2013-01-15 (×2): qty 1

## 2013-01-15 MED ORDER — CENTRUM PO CHEW: 1.0000 | CHEWABLE_TABLET | Freq: Every day | ORAL | Status: DC

## 2013-01-15 MED ORDER — ATORVASTATIN CALCIUM 10 MG PO TABS
10.0000 mg | ORAL_TABLET | Freq: Every day | ORAL | Status: DC
  Administered 2013-01-15 – 2013-01-17 (×3): 10 mg via ORAL
  Filled 2013-01-15 (×4): qty 1

## 2013-01-15 MED ORDER — LORATADINE 10 MG PO TABS
10.0000 mg | ORAL_TABLET | Freq: Every day | ORAL | Status: DC
Start: 2013-01-16 — End: 2013-01-18
  Administered 2013-01-16 – 2013-01-17 (×2): 10 mg via ORAL
  Filled 2013-01-15 (×3): qty 1

## 2013-01-15 MED ORDER — ACETAMINOPHEN 325 MG PO TABS: 650.0000 mg | ORAL_TABLET | ORAL | Status: DC | PRN

## 2013-01-15 MED ORDER — MOMETASONE FURO-FORMOTEROL FUM 100-5 MCG/ACT IN AERO
2.0000 | INHALATION_SPRAY | Freq: Two times a day (BID) | RESPIRATORY_TRACT | Status: DC
  Administered 2013-01-15 – 2013-01-17 (×4): 2 via RESPIRATORY_TRACT
  Filled 2013-01-15 (×2): qty 8.8

## 2013-01-15 MED ORDER — ACETAMINOPHEN 650 MG RE SUPP: 650.0000 mg | RECTAL | Status: DC | PRN

## 2013-01-15 MED ORDER — CARVEDILOL 25 MG PO TABS
25.0000 mg | ORAL_TABLET | Freq: Two times a day (BID) | ORAL | Status: DC
  Administered 2013-01-15 – 2013-01-18 (×6): 25 mg via ORAL
  Filled 2013-01-15 (×8): qty 1

## 2013-01-15 MED ORDER — FLUTICASONE PROPIONATE 50 MCG/ACT NA SUSP
1.0000 | Freq: Every day | NASAL | Status: DC
  Administered 2013-01-16 – 2013-01-17 (×2): 1 via NASAL
  Filled 2013-01-15: qty 16

## 2013-01-15 MED ORDER — IRBESARTAN 300 MG PO TABS
300.0000 mg | ORAL_TABLET | Freq: Every day | ORAL | Status: DC
  Administered 2013-01-16 – 2013-01-17 (×2): 300 mg via ORAL
  Filled 2013-01-15 (×3): qty 1

## 2013-01-15 MED ORDER — ADULT MULTIVITAMIN W/MINERALS CH
1.0000 | ORAL_TABLET | Freq: Every day | ORAL | Status: DC
  Administered 2013-01-16 – 2013-01-17 (×2): 1 via ORAL
  Filled 2013-01-15 (×3): qty 1

## 2013-01-15 MED ORDER — SENNOSIDES-DOCUSATE SODIUM 8.6-50 MG PO TABS
1.0000 | ORAL_TABLET | Freq: Every evening | ORAL | Status: DC | PRN
  Filled 2013-01-15: qty 1

## 2013-01-15 NOTE — ED Provider Notes (Signed)
I saw and evaluated the patient, reviewed the resident's note and I agree with the findings and plan.  LKW 0800, Sxs resolved, transient right face/arm/leg weak, exam no facial asymmetry extraocular movements intact peripheral visual fields full to confrontation no pronator drift in arms or legs normal finger to nose testing bilaterally, patient was observed to ambulate at baseline according to family upon arrival the ED.  ECG: Sinus rhythm, rate 86, T wave abnormality, no significant change noted since prior ECG  Hurman Horn, MD 01/16/13 1303

## 2013-01-15 NOTE — ED Provider Notes (Signed)
History     CSN: [id number]  Arrival date & time 01/15/13  1349   None     Chief Complaint  Patient presents with  . Transient Ischemic Attack    (Consider location/radiation/quality/duration/timing/severity/associated sxs/prior treatment) Patient is a 67 y.o. female presenting with neurologic complaint. The history is provided by the patient and a relative.  Neurologic Problem This is a new problem. The current episode started today. The problem occurs constantly. The problem has been resolved. Pertinent negatives include no abdominal pain, chest pain, chills, fever, headaches, nausea, numbness, vertigo, visual change, vomiting or weakness. Nothing aggravates the symptoms. She has tried nothing for the symptoms.    Past Medical History  Diagnosis Date  . Hypertension   . Asthma   . Seasonal allergies   . High cholesterol   . GERD (gastroesophageal reflux disease)   . Uterine cancer   . Takotsubo cardiomyopathy     a. NSTEMI 2/2 takotsubo 01/2013, no significant CAD by cath, EF 25%.  . Pulmonary HTN     a. Mod pulm HTN by cath 01/2013.  Marland Kitchen PVC's (premature ventricular contractions)     a. Noted during 01/2013 admission.  . Myocardial infarction     Past Surgical History  Procedure Laterality Date  . Abdominal hysterectomy  1999    History reviewed. No pertinent family history.  History  Substance Use Topics  . Smoking status: Never Smoker   . Smokeless tobacco: Never Used  . Alcohol Use: No    OB History   Grav Para Term Preterm Abortions TAB SAB Ect Mult Living                  Review of Systems  Constitutional: Negative for fever and chills.  Eyes: Negative for visual disturbance.  Respiratory: Negative for chest tightness and shortness of breath.   Cardiovascular: Negative for chest pain and palpitations.  Gastrointestinal: Negative for nausea, vomiting, abdominal pain, diarrhea and constipation.  Genitourinary: Negative for dysuria.  Neurological:  Positive for facial asymmetry (right facial droop noted by family) and speech difficulty (slurred speech). Negative for dizziness, vertigo, weakness, numbness and headaches.  All other systems reviewed and are negative.    Allergies  Cetirizine hcl  Home Medications   Current Outpatient Rx  Name  Route  Sig  Dispense  Refill  . alendronate (FOSAMAX) 70 MG tablet   Oral   Take 70 mg by mouth every 7 (seven) days. Take with a full glass of water on an empty stomach.         Marland Kitchen aspirin EC 81 MG EC tablet   Oral   Take 1 tablet (81 mg total) by mouth daily.         Marland Kitchen atorvastatin (LIPITOR) 10 MG tablet   Oral   Take 1 tablet (10 mg total) by mouth daily.   30 tablet   3   . carvedilol (COREG) 25 MG tablet   Oral   Take 1 tablet (25 mg total) by mouth 2 (two) times daily with a meal.   60 tablet   3   . fluticasone (VERAMYST) 27.5 MCG/SPRAY nasal spray   Nasal   Place 2 sprays into the nose daily.           . fluticasone-salmeterol (ADVAIR HFA) 115-21 MCG/ACT inhaler   Inhalation   Inhale 2 puffs into the lungs 2 (two) times daily.           . irbesartan (AVAPRO) 300 MG tablet  Oral   Take 1 tablet (300 mg total) by mouth daily.   30 tablet   3   . loratadine (CLARITIN) 10 MG tablet   Oral   Take 10 mg by mouth daily.           . multivitamin-iron-minerals-folic acid (CENTRUM) chewable tablet   Oral   Chew 1 tablet by mouth daily.           . pantoprazole (PROTONIX) 20 MG tablet   Oral   Take 20 mg by mouth daily.             BP 127/78  Pulse 81  Temp(Src) 98.1 F (36.7 C) (Oral)  Resp 16  SpO2 100%  Physical Exam  Nursing note and vitals reviewed. Constitutional: She is oriented to person, place, and time. She appears well-developed and well-nourished. No distress.  HENT:  Head: Normocephalic and atraumatic.  Mouth/Throat: Oropharynx is clear and moist.  Eyes: EOM are normal. Pupils are equal, round, and reactive to light.  Neck:  Normal range of motion. Neck supple.  Cardiovascular: Normal rate, regular rhythm and normal heart sounds.  Exam reveals no friction rub.   No murmur heard. Pulmonary/Chest: Effort normal and breath sounds normal. No respiratory distress. She has no wheezes. She has no rales.  Abdominal: Soft. There is no tenderness. There is no rebound and no guarding.  Musculoskeletal: Normal range of motion. She exhibits no edema and no tenderness.  Lymphadenopathy:    She has no cervical adenopathy.  Neurological: She is alert and oriented to person, place, and time. She has normal strength. No cranial nerve deficit or sensory deficit. GCS eye subscore is 4. GCS verbal subscore is 5. GCS motor subscore is 6.  5/5 strength in all extremities. Normal finger to nose and finger-nose-finger.   Skin: Skin is warm and dry. No rash noted.  Psychiatric: She has a normal mood and affect. Her behavior is normal.    ED Course  Procedures (including critical care time)  Labs Reviewed  POCT I-STAT, CHEM 8 - Abnormal; Notable for the following:    Sodium 134 (*)    Calcium, Ion 1.31 (*)    All other components within normal limits  POCT I-STAT TROPONIN I - Abnormal; Notable for the following:    Troponin i, poc 0.73 (*)    All other components within normal limits  CBC  GLUCOSE, CAPILLARY  ETHANOL  PROTIME-INR  APTT  URINE RAPID DRUG SCREEN (HOSP PERFORMED)  URINALYSIS, ROUTINE W REFLEX MICROSCOPIC  TROPONIN I   Ct Head Wo Contrast  01/15/2013   *RADIOLOGY REPORT*  Clinical Data: TIA  CT HEAD WITHOUT CONTRAST  Technique:  Contiguous axial images were obtained from the base of the skull through the vertex without contrast.  Comparison: None.  Findings: No skull fracture is noted.  No intracranial hemorrhage, mass effect or midline shift.  Minimal cerebral atrophy.  Mild periventricular white matter decreased attenuation probable due to chronic small vessel ischemic changes.  No acute cortical infarction.  No  mass lesion is noted on this unenhanced scan. There is mucosal thickening with air-fluid level bilateral maxillary sinus.  IMPRESSION: No acute intracranial abnormality.  Mild cerebral atrophy. Mucosal thickening bilateral maxillary sinus.   Original Report Authenticated By: Natasha Mead, M.D.     1. TIA (transient ischemic attack)       MDM  3:08 PM 66yo female dc'd from hospital yesterday after being admitted on 6/9 with NSTEMI here with right facial droop  and slurred speech that started earlier today. Pt was last seen normal around 8am, roughly 8hrs PTA. A little before noon was noted by family to to have slight facial droop, drooling from right side of mouth and have slurred speech. Pt also endorses she felt dizzy and difficulty with her gait. She denies h/o stroke but endorses FH of stroke. She feels her symptoms have no resolved. Neuro exam including cerebellar function testing unremarkable. CT head neg. Labs with no significant abnormality.   4:48 PM labs show no significant abnormality. CT head negative. Discussed with neurology and they agreed with admission to medicine for further evaluation. Medicine consulted and patient admitted for further management.       Caren Hazy, MD 01/16/13 [phone]

## 2013-01-15 NOTE — ED Notes (Signed)
The pts blood  Sugar is 69.  The pt has not eaten since this am.  Meal given.  Family remains at the bedside

## 2013-01-15 NOTE — ED Notes (Signed)
Report called to [id number] rn Shanda Bumps

## 2013-01-15 NOTE — ED Notes (Signed)
Pt ready to leave admitting doctor  Has arrived to see

## 2013-01-15 NOTE — H&P (Signed)
[facility] History and Physical  [name]:[id number] DOB: Dec 07, 1945 DOA: 01/15/2013  Referring physician: Dr. Fonnie Jarvis PCP: Geraldo Pitter, MD  Specialists:   Chief Complaint: Slurred speech and right facial droop  HPI: Deanna Brown is a 67 y.o. 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 She reports that at about 11:30 today she developed a slurred speech and her family are noted that she had a right facial droop. She also states and that her gait was unsteady with some right-sided weakness. Patient states that by the time she arrived in the ED a slurred speech facial droop and right-sided weakness had all resolved. A CT scan of her head was done and came back negative. Neurology was consulted and admission to the hospitalist service requested. Patient denies headaches dysphagia and no blurry vision. She states she is back to her baseline at this time.  Review of Systems: The patient denies anorexia, fever, weight loss,, vision loss, decreased hearing, hoarseness, chest pain, syncope, dyspnea on exertion, peripheral edema, balance deficits, hemoptysis, abdominal pain, melena, hematochezia, severe indigestion/heartburn, hematuria, incontinence, genital soress, suspicious skin lesions, transient blindness, depression, unusual weight change, abnormal bleeding, enlarged lymph nodes, angioedema, and breast masses.    Past Medical History  Diagnosis Date  . Hypertension   . Asthma   . Seasonal allergies   . High cholesterol   . GERD (gastroesophageal reflux disease)   . Uterine cancer   . Takotsubo cardiomyopathy     a. NSTEMI 2/2 takotsubo 01/2013, no significant CAD by cath, EF 25%.  . Pulmonary HTN     a. Mod pulm HTN by cath 01/2013.  Marland Kitchen PVC's (premature ventricular  contractions)     a. Noted during 01/2013 admission.  . Myocardial infarction    Past Surgical History  Procedure Laterality Date  . Abdominal hysterectomy  1999   Social History:  reports that she has never smoked. She has never used smokeless tobacco. She reports that she does not drink alcohol or use illicit drugs.  where does patient live-at-home   Allergies  Allergen Reactions  . Cetirizine Hcl Itching     family history- her mother had MI, her father had 'mini strokes'.   Prior to Admission medications   Medication Sig Start Date End Date Taking? Authorizing Provider  alendronate (FOSAMAX) 70 MG tablet Take 70 mg by mouth every 7 (seven) days. Take with a full glass of water on an empty stomach.   Yes Historical Provider, MD  aspirin EC 81 MG EC tablet Take 1 tablet (81 mg total) by mouth daily. 01/14/13  Yes Dayna N Dunn, PA-C  atorvastatin (LIPITOR) 10 MG tablet Take 1 tablet (10 mg total) by mouth daily. 01/14/13  Yes Dayna N Dunn, PA-C  carvedilol (COREG) 25 MG tablet Take 1 tablet (25 mg total) by mouth 2 (two) times daily with a meal. 01/14/13  Yes Dayna N Dunn, PA-C  fluticasone (VERAMYST) 27.5 MCG/SPRAY nasal spray Place 2 sprays into the nose daily.     Yes Historical Provider, MD  Fluticasone-Salmeterol (ADVAIR) 100-50 MCG/DOSE AEPB Inhale 1 puff into the lungs every 12 (twelve) hours.   Yes Historical Provider, MD  irbesartan (AVAPRO) 300 MG tablet Take 1 tablet (300 mg total) by mouth daily. 01/14/13  Yes Dayna N Dunn, PA-C  loratadine (CLARITIN) 10 MG tablet Take 10 mg by  mouth daily.     Yes Historical Provider, MD  multivitamin-iron-minerals-folic acid (CENTRUM) chewable tablet Chew 1 tablet by mouth daily.     Yes Historical Provider, MD  pantoprazole (PROTONIX) 40 MG tablet Take 40 mg by mouth daily.   Yes Historical Provider, MD   Physical Exam: Filed Vitals:   01/15/13 1355 01/15/13 1519 01/15/13 1716 01/15/13 1811  BP: 127/78 113/68 167/94 138/83  Pulse: 81 94  88 92  Temp: 98.1 F (36.7 C)   98.2 F (36.8 C)  TempSrc: Oral   Oral  Resp: 16 20  18   Height:    5\' 1"  (1.549 m)  Weight:    49.7 kg (109 lb 9.1 oz)  SpO2: 100% 99% 100% 100%    Constitutional: Vital signs reviewed.  Patient is a well-developed and well-nourished in no acute distress and cooperative with exam. Alert and oriented x3.  Head: Normocephalic and atraumatic, no facial asymmetry Nose: No erythema or drainage noted.  Turbinates normal Mouth: no erythema or exudates, MMM Eyes: PERRL, EOMI, conjunctivae normal, No scleral icterus.  Neck: Supple, Trachea midline normal ROM, No JVD, mass, thyromegaly, or carotid bruit present.  Cardiovascular: RRR, S1 normal, S2 normal, no MRG, pulses symmetric and intact bilaterally Pulmonary/Chest: normal respiratory effort, CTAB, no wheezes, rales, or rhonchi Abdominal: Soft. Non-tender, non-distended, bowel sounds are normal, no masses, organomegaly, or guarding present.   Extremities: No cyanosis and no  Neurological: A&O x3, Strength is normal and symmetric bilaterally, cranial nerve II-XII are grossly intact, no focal motor deficit, sensory intact to light touch bilaterally.  Skin: Warm, dry and intact. No rash, cyanosis, or clubbing.  Psychiatric: Normal mood and affect. speech and behavior is normal. Judgment and thought content normal. Cognition and memory are normal.    Labs on Admission:  Basic Metabolic Panel:  Recent Labs Lab 01/10/13 1857 01/11/13 0200 01/11/13 1102 01/11/13 1635 01/12/13 0942 01/13/13 0420 01/15/13 1431  NA 131* 133*  --   --  132* 132* 134*  K 3.1* 2.8* 3.6  --  3.1* 4.2 3.6  CL 93* 99  --   --  96 98 100  CO2 26 23  --   --  26 27  --   GLUCOSE 111* 142*  --   --  141* 100* 91  BUN 10 10  --   --  9 14 11   CREATININE 0.73 0.65  --  0.74 0.75 0.91 0.90  CALCIUM 9.9 8.8  --   --  8.7 8.4  --   MG  --   --   --   --  1.9  --   --    Liver Function Tests:  Recent Labs Lab 01/10/13 1857  01/13/13 0420  AST 50* 21  ALT 15 9  ALKPHOS 68 58  BILITOT 0.6 0.9  PROT 8.6* 6.7  ALBUMIN 3.5 2.6*   No results found for this basename: LIPASE, AMYLASE,  in the last 168 hours No results found for this basename: AMMONIA,  in the last 168 hours CBC:  Recent Labs Lab 01/10/13 1857  01/11/13 1635 01/12/13 0452 01/13/13 0420 01/14/13 0330 01/15/13 1420 01/15/13 1431  WBC 12.2*  < > 11.3* 11.0* 9.6 7.3 5.4  --   NEUTROABS 8.8*  --   --   --   --   --   --   --   HGB 15.1*  < > 13.5 13.2 12.1 11.8* 13.4 13.3  HCT 42.0  < > 39.0 38.5  34.6* 33.9* 38.5 39.0  MCV 80.6  < > 82.1 82.6 81.6 82.5 82.4  --   PLT 393  < > 345 307 286 297 365  --   < > = values in this interval not displayed. Cardiac Enzymes:  Recent Labs Lab 01/10/13 2000 01/10/13 2336 01/11/13 0200 01/11/13 1102 01/15/13 1615  TROPONINI 13.69* 11.71* 10.66* 7.96* 1.15*    BNP (last 3 results)  Recent Labs  01/11/13 0200 01/12/13 0926  PROBNP 13925.0* 33098.0*   CBG:  Recent Labs Lab 01/15/13 1419  GLUCAP 88    Radiological Exams on Admission: Ct Head Wo Contrast  01/15/2013   *RADIOLOGY REPORT*  Clinical Data: TIA  CT HEAD WITHOUT CONTRAST  Technique:  Contiguous axial images were obtained from the base of the skull through the vertex without contrast.  Comparison: None.  Findings: No skull fracture is noted.  No intracranial hemorrhage, mass effect or midline shift.  Minimal cerebral atrophy.  Mild periventricular white matter decreased attenuation probable due to chronic small vessel ischemic changes.  No acute cortical infarction.  No mass lesion is noted on this unenhanced scan. There is mucosal thickening with air-fluid level bilateral maxillary sinus.  IMPRESSION: No acute intracranial abnormality.  Mild cerebral atrophy. Mucosal thickening bilateral maxillary sinus.   Original Report Authenticated By: Natasha Mead, M.D.      Assessment/Plan Principal Problem:   TIA (transient ischemic  attack) -As discussed above 67 year old female presenting with transient strokelike symptoms- now resolved, will admit to complete a CVA workup as per protocol. -Obtain MRI/MRA, carotid Doppler, fasting lipid profile and hemoglobin A1c.  -she just had a 2-D echo on 01/12/13 with EF of 25-30%-discussed with neuro/Dr. Roseanne Reno and he agrees no need to repeat at this time. -Will increase aspirin to 325 mg as per neuro recommendations -PT/OT/ST consult and follow Active Problems:   Takotsubo cardiomyopathy -Continue outpatient medications, cardiology notified of readmission per EDP.   GERD (gastroesophageal reflux disease) -Continue PPI   HTN (hypertension), benign -Continue outpatient medications Recent NSTEMI -She is chest pain free, continue outpatient medications.    Code Status: full Family Communication: son at bedside Disposition Plan: admit to neuro tele  Time spent: >45mins  Kela Millin [facility] Pager [phone]  If 7PM-7AM, please contact night-coverage [email or web address] Password [facility] 01/15/2013, 6:32 PM

## 2013-01-15 NOTE — ED Notes (Signed)
Saline lok placed.  tp comfprtable family at the bedside

## 2013-01-15 NOTE — ED Notes (Signed)
The  Pt is alert family at the bedside.  She passed her swallow screen.  She is having no discomfort at present c/o being thirsty .  She oriented skin warm and dry.  Moves all extremities

## 2013-01-15 NOTE — ED Notes (Signed)
Admitting doctor at the bedside 

## 2013-01-15 NOTE — ED Notes (Signed)
Pt family reports she was discharged from [facility] yesterday after admission for heart attack. Son left pt at home this morning at 0800 and she was normal, friend came to door around 1200 and "it took her a while to get to the door, and she was staggering all around the house." friend states when she opened the door the pt speech sounded slurred and R face looked drooped. Friend states pts symptoms seem better now. Pt is A&Ox4, grips = bilateral, no arm drift, speech is easy to understand and pt denies any pain

## 2013-01-15 NOTE — Consult Note (Signed)
Referring Physician: ED    Chief Complaint: Right sided weakness HPI: Deanna Brown is a 67 y.o. female discharge from [facility] yesterday 01/14/2013 following admission for an MI. The patient had returned home where she lives with her son. The patient's son stated that he left the house at approximately 8:00 am this morning and at that time his mother was fine. His mother later had a friend stop by who left at approximately 11:30 AM today. Again at that time the patient was fine. Around noon today family members stopped by and noted that the patient had dysarthria as well as a right facial droop and was having difficulty walking secondary to right-sided weakness. The patient was brought to the emergency department where a CT scan was performed that showed no acute abnormality. The patient's symptoms have since resolved and she feels back to baseline. Neurology was asked to see the patient for possible TIA.  Date last known well: 01/15/2013 Time last known well: 11:30 AM tPA Given: The patient did not receive TPA as she is currently back to baseline and passed the window for treatment.  Past Medical History  Diagnosis Date  . Hypertension   . Asthma   . Seasonal allergies   . High cholesterol   . GERD (gastroesophageal reflux disease)   . Uterine cancer   . Takotsubo cardiomyopathy     a. NSTEMI 2/2 takotsubo 01/2013, no significant CAD by cath, EF 25%.  . Pulmonary HTN     a. Mod pulm HTN by cath 01/2013.  Marland Kitchen PVC's (premature ventricular contractions)     a. Noted during 01/2013 admission.  . Myocardial infarction     Past Surgical History  Procedure Laterality Date  . Abdominal hysterectomy  1999    Family history: The patient's mother is alive at age 67. She has hypertension and coronary artery disease. Her father died at age 93 with complications from dementia.  Social History:  reports that she has never smoked. She has never used smokeless tobacco. She reports that  she does not drink alcohol or use illicit drugs. the patient was in [place]. Her son lives with him. She has 3 children.  Allergies:  Allergies  Allergen Reactions  . Cetirizine Hcl Itching    Medications: Fosamax 70 mg each week; aspirin 81 mg daily; atorvastatin 10 mg daily; Corgard 25 mg twice a day; fluticasone 2 nasal sprays daily; Advair 2 puffs twice daily; Claritin 10 mg daily; Avapro 300 mg daily; multivitamin one daily; Protonix 20 mg daily  ROS: History obtained from the patient  General ROS: negative for - chills, fatigue, fever, night sweats, weight gain or weight loss Psychological ROS: negative for - behavioral disorder, hallucinations, memory difficulties, mood swings or suicidal ideation Ophthalmic ROS: negative for - blurry vision, double vision, eye pain or loss of vision ENT ROS: negative for - epistaxis, nasal discharge, oral lesions, sore throat, tinnitus or vertigo Allergy and Immunology ROS: negative for - hives or itchy/watery eyes Hematological and Lymphatic ROS: negative for - bleeding problems, bruising or swollen lymph nodes Endocrine ROS: negative for - galactorrhea, hair pattern changes, polydipsia/polyuria or temperature intolerance Respiratory ROS: negative for - cough, hemoptysis, or wheezing. Positive for shortness of breath prior to her recent MI. Cardiovascular ROS: negative for - chest pain, dyspnea on exertion, edema or irregular heartbeat Gastrointestinal ROS: negative for - abdominal pain, diarrhea, hematemesis, nausea/vomiting or stool incontinence Genito-Urinary ROS: negative for - dysuria, hematuria, incontinence or urinary frequency/urgency Musculoskeletal ROS: negative  for - joint swelling or muscular weakness Neurological ROS: as noted in HPI Dermatological ROS: negative for rash and skin lesion changes  Physical Examination:  Blood pressure 113/68, pulse 94, temperature 98.1 F (36.7 C), temperature source Oral, resp. rate 20, SpO2  99.00%. General - pleasant alert 67 year old female in no acute distress. Heart - Regular rate and rhythm - no murmer Lungs - Clear to auscultation Abdomen - Soft - non tender Extremities - Distal pulses intact - no edema Skin - Warm and dry   Neurologic Examination: Mental Status: Alert, oriented, thought content appropriate.  Speech fluent without evidence of aphasia.  Able to follow 3 step commands without difficulty. Cranial Nerves: II: Discs unable to visualize; Visual fields grossly normal, pupils equal, round, reactive to light and accommodation III,IV, VI: ptosis not present, extra-ocular motions intact bilaterally V,VII: smile symmetric, facial light touch sensation normal bilaterally VIII: hearing normal bilaterally IX,X: gag reflex present XI: bilateral shoulder shrug XII: midline tongue extension Motor: Right : Upper extremity   5/5    Left:     Upper extremity   5/5  Lower extremity   5/5     Lower extremity   5/5 Tone and bulk:normal tone throughout; no atrophy noted Sensory: Light touch intact throughout, bilaterally Deep Tendon Reflexes: 2+ and symmetric throughout Plantars: Right: downgoing   Left: Mute Cerebellar: normal finger-to-nose, normal rapid alternating movements  Gait: Deferred  CV: pulses palpable throughout   Laboratory Studies:  Basic Metabolic Panel:  Recent Labs Lab 01/10/13 1857 01/11/13 0200 01/11/13 1102 01/11/13 1635 01/12/13 0942 01/13/13 0420 01/15/13 1431  NA 131* 133*  --   --  132* 132* 134*  K 3.1* 2.8* 3.6  --  3.1* 4.2 3.6  CL 93* 99  --   --  96 98 100  CO2 26 23  --   --  26 27  --   GLUCOSE 111* 142*  --   --  141* 100* 91  BUN 10 10  --   --  9 14 11   CREATININE 0.73 0.65  --  0.74 0.75 0.91 0.90  CALCIUM 9.9 8.8  --   --  8.7 8.4  --   MG  --   --   --   --  1.9  --   --     Liver Function Tests:  Recent Labs Lab 01/10/13 1857 01/13/13 0420  AST 50* 21  ALT 15 9  ALKPHOS 68 58  BILITOT 0.6 0.9  PROT  8.6* 6.7  ALBUMIN 3.5 2.6*   No results found for this basename: LIPASE, AMYLASE,  in the last 168 hours No results found for this basename: AMMONIA,  in the last 168 hours  CBC:  Recent Labs Lab 01/10/13 1857  01/11/13 1635 01/12/13 0452 01/13/13 0420 01/14/13 0330 01/15/13 1420 01/15/13 1431  WBC 12.2*  < > 11.3* 11.0* 9.6 7.3 5.4  --   NEUTROABS 8.8*  --   --   --   --   --   --   --   HGB 15.1*  < > 13.5 13.2 12.1 11.8* 13.4 13.3  HCT 42.0  < > 39.0 38.5 34.6* 33.9* 38.5 39.0  MCV 80.6  < > 82.1 82.6 81.6 82.5 82.4  --   PLT 393  < > 345 307 286 297 365  --   < > = values in this interval not displayed.  Cardiac Enzymes:  Recent Labs Lab 01/10/13 2000 01/10/13 2336 01/11/13 0200  01/11/13 1102 01/15/13 1615  TROPONINI 13.69* 11.71* 10.66* 7.96* 1.15*    BNP: No components found with this basename: POCBNP,   CBG:  Recent Labs Lab 01/15/13 1419  GLUCAP 88    Microbiology: Results for orders placed during the hospital encounter of 01/10/13  MRSA PCR SCREENING     Status: None   Collection Time    01/10/13 10:11 PM      Result Value Range Status   MRSA by PCR NEGATIVE  NEGATIVE Final   Comment:            The GeneXpert MRSA Assay (FDA     approved for NASAL specimens     only), is one component of a     comprehensive MRSA colonization     surveillance program. It is not     intended to diagnose MRSA     infection nor to guide or     monitor treatment for     MRSA infections.    Coagulation Studies:  Recent Labs  01/15/13 1615  LABPROT 13.0  INR 0.99    Urinalysis:  Recent Labs Lab 01/12/13 1534  COLORURINE YELLOW  LABSPEC 1.008  PHURINE 7.5  GLUCOSEU NEGATIVE  HGBUR NEGATIVE  BILIRUBINUR NEGATIVE  KETONESUR NEGATIVE  PROTEINUR NEGATIVE  UROBILINOGEN 0.2  NITRITE NEGATIVE  LEUKOCYTESUR TRACE*    Lipid Panel:    Component Value Date/Time   CHOL 159 01/11/2013 0200   TRIG 65 01/11/2013 0200   HDL 52 01/11/2013 0200   CHOLHDL  3.1 01/11/2013 0200   VLDL 13 01/11/2013 0200   LDLCALC 94 01/11/2013 0200    HgbA1C:  Lab Results  Component Value Date   HGBA1C 6.2* 01/10/2013    Urine Drug Screen:     Component Value Date/Time   LABOPIA NONE DETECTED 01/15/2013 1627   COCAINSCRNUR NONE DETECTED 01/15/2013 1627   LABBENZ NONE DETECTED 01/15/2013 1627   AMPHETMU NONE DETECTED 01/15/2013 1627   THCU NONE DETECTED 01/15/2013 1627   LABBARB NONE DETECTED 01/15/2013 1627    Alcohol Level:  Recent Labs Lab 01/15/13 1615  ETH <11    Other results: EKG: Normal sinus rhythm rate 86 beats per minute  Imaging:  Ct Head Wo Contrast 01/15/2013   No acute intracranial abnormality.  Mild cerebral atrophy. Mucosal thickening bilateral maxillary sinus.     Assessment: 67 y.o. female discharged from [facility] yesterday following admission for an acute MI. Now being readmitted to for what appears to and a TIA consisting of dysarthria and right-sided weakness including a right facial droop. The patient did not receive TPA as her symptoms quickly resolved.   Stroke Risk Factors - hypertension and Left ventricular dysfunction  Plan: 1. HgbA1c, fasting lipid panel ( hemoglobin A1c 6.2 - cholesterol 159 - LDL 94) 2. MRI, MRA  of the brain without contrast 3. PT consult, OT consult, Speech consult 4. Echocardiogram 5. Carotid dopplers 6. Prophylactic therapy-Antiplatelet med: Aspirin - dose 81 mg daily prior to admission. Increase to 325 mg daily. 7. Risk factor modification 8. Telemetry monitoring 9. Frequent neuro checks  Delton See PA-C [facility] Pager [phone]  I personally participated in this patient's care, including approving the above clinical assessment and management recommendations.  Venetia Maxon M.D. [facility] [phone]  01/15/2013, 4:58 PM

## 2013-01-16 ENCOUNTER — Observation Stay (HOSPITAL_COMMUNITY): Payer: Medicare Other

## 2013-01-16 ENCOUNTER — Encounter (HOSPITAL_COMMUNITY): Payer: Self-pay | Admitting: *Deleted

## 2013-01-16 DIAGNOSIS — G459 Transient cerebral ischemic attack, unspecified: Secondary | ICD-10-CM

## 2013-01-16 LAB — HEMOGLOBIN A1C
Hgb A1c MFr Bld: 5.7 % — ABNORMAL HIGH (ref <5.7)
Mean Plasma Glucose: 117 mg/dL — ABNORMAL HIGH (ref <117)

## 2013-01-16 LAB — LIPID PANEL
Cholesterol: 127 mg/dL (ref 0–200)
HDL: 30 mg/dL — ABNORMAL LOW
LDL Cholesterol: 78 mg/dL (ref 0–99)
Total CHOL/HDL Ratio: 4.2 ratio
Triglycerides: 94 mg/dL
VLDL: 19 mg/dL (ref 0–40)

## 2013-01-16 MED ORDER — ENOXAPARIN SODIUM 40 MG/0.4ML ~~LOC~~ SOLN
40.0000 mg | SUBCUTANEOUS | Status: DC
  Administered 2013-01-16 – 2013-01-17 (×2): 40 mg via SUBCUTANEOUS
  Filled 2013-01-16 (×3): qty 0.4

## 2013-01-16 MED ORDER — CLOPIDOGREL BISULFATE 75 MG PO TABS
75.0000 mg | ORAL_TABLET | Freq: Every day | ORAL | Status: DC
  Filled 2013-01-16: qty 1

## 2013-01-16 MED ORDER — CLOPIDOGREL BISULFATE 75 MG PO TABS
75.0000 mg | ORAL_TABLET | Freq: Every day | ORAL | Status: DC
  Administered 2013-01-16 – 2013-01-18 (×3): 75 mg via ORAL
  Filled 2013-01-16 (×2): qty 1

## 2013-01-16 NOTE — Progress Notes (Signed)
MRI positive, MD notified, no new orders given, will continue to monitor

## 2013-01-16 NOTE — Progress Notes (Signed)
Stroke Team Progress Note  HISTORY Deanna Brown is a 67 y.o. female discharge from [facility] yesterday 01/14/2013 following admission for an MI. The patient had returned home where she lives with her son. The patient's son stated that he left the house at approximately 8:00 am this morning and at that time his mother was fine. His mother later had a friend stop by who left at approximately 11:30 AM today. Again at that time the patient was fine. Around noon today family members stopped by and noted that the patient had dysarthria as well as a right facial droop and was having difficulty walking secondary to right-sided weakness. The patient was brought to the emergency department where a CT scan was performed that showed no acute abnormality. The patient's symptoms have since resolved and she feels back to baseline. Neurology was asked to see the patient for possible TIA.   Patient was not a TPA candidate secondary to minimal deficit.  SUBJECTIVE Her son is at bedside. The patient indicates that she is at or near baseline. No residual deficits. For an  OBJECTIVE Most recent Vital Signs: Filed Vitals:   01/16/13 0400 01/16/13 0600 01/16/13 0807 01/16/13 0815  BP: 121/90 127/80  151/85  Pulse: 75 84  78  Temp: 98 F (36.7 C) 98.4 F (36.9 C)  97.9 F (36.6 C)  TempSrc: Oral Oral  Oral  Resp: 18 18  20   Height:      Weight: 111 lb 15.9 oz (50.8 kg)     SpO2: 99% 98% 100% 100%   CBG (last 3)   Recent Labs  01/15/13 1419  GLUCAP 88    IV Fluid Intake:     MEDICATIONS  . aspirin EC  325 mg Oral Daily  . atorvastatin  10 mg Oral q1800  . carvedilol  25 mg Oral BID WC  . fluticasone  1 spray Each Nare Daily  . irbesartan  300 mg Oral Daily  . loratadine  10 mg Oral Daily  . mometasone-formoterol  2 puff Inhalation BID  . multivitamin with minerals  1 tablet Oral Daily  . pantoprazole  40 mg Oral Daily   PRN:  acetaminophen, acetaminophen, senna-docusate  Diet:   Cardiac thin liquids Activity:   Up with assistance DVT Prophylaxis:  lovenox  CLINICALLY SIGNIFICANT STUDIES Basic Metabolic Panel:  Recent Labs Lab 01/11/13 0200  01/12/13 0942 01/13/13 0420 01/15/13 1431  NA 133*  --  132* 132* 134*  K 2.8*  < > 3.1* 4.2 3.6  CL 99  --  96 98 100  CO2 23  --  26 27  --   GLUCOSE 142*  --  141* 100* 91  BUN 10  --  9 14 11   CREATININE 0.65  < > 0.75 0.91 0.90  CALCIUM 8.8  --  8.7 8.4  --   MG  --   --  1.9  --   --   < > = values in this interval not displayed. Liver Function Tests:  Recent Labs Lab 01/10/13 1857 01/13/13 0420  AST 50* 21  ALT 15 9  ALKPHOS 68 58  BILITOT 0.6 0.9  PROT 8.6* 6.7  ALBUMIN 3.5 2.6*   CBC:  Recent Labs Lab 01/10/13 1857  01/14/13 0330 01/15/13 1420 01/15/13 1431  WBC 12.2*  < > 7.3 5.4  --   NEUTROABS 8.8*  --   --   --   --   HGB 15.1*  < > 11.8*  13.4 13.3  HCT 42.0  < > 33.9* 38.5 39.0  MCV 80.6  < > 82.5 82.4  --   PLT 393  < > 297 365  --   < > = values in this interval not displayed. Coagulation:  Recent Labs Lab 01/10/13 1940 01/15/13 1615  LABPROT 13.4 13.0  INR 1.03 0.99   Cardiac Enzymes:  Recent Labs Lab 01/11/13 0200 01/11/13 1102 01/15/13 1615  TROPONINI 10.66* 7.96* 1.15*   Urinalysis:  Recent Labs Lab 01/12/13 1534 01/15/13 1627  COLORURINE YELLOW YELLOW  LABSPEC 1.008 1.009  PHURINE 7.5 7.0  GLUCOSEU NEGATIVE NEGATIVE  HGBUR NEGATIVE NEGATIVE  BILIRUBINUR NEGATIVE NEGATIVE  KETONESUR NEGATIVE NEGATIVE  PROTEINUR NEGATIVE NEGATIVE  UROBILINOGEN 0.2 1.0  NITRITE NEGATIVE NEGATIVE  LEUKOCYTESUR TRACE* NEGATIVE   Lipid Panel    Component Value Date/Time   CHOL 127 01/16/2013 0420   TRIG 94 01/16/2013 0420   HDL 30* 01/16/2013 0420   CHOLHDL 4.2 01/16/2013 0420   VLDL 19 01/16/2013 0420   LDLCALC 78 01/16/2013 0420   HgbA1C  Lab Results  Component Value Date   HGBA1C 6.2* 01/10/2013    Urine Drug Screen:     Component Value Date/Time   LABOPIA NONE  DETECTED 01/15/2013 1627   COCAINSCRNUR NONE DETECTED 01/15/2013 1627   LABBENZ NONE DETECTED 01/15/2013 1627   AMPHETMU NONE DETECTED 01/15/2013 1627   THCU NONE DETECTED 01/15/2013 1627   LABBARB NONE DETECTED 01/15/2013 1627    Alcohol Level:  Recent Labs Lab 01/15/13 1615  ETH <11    Dg Chest 2 View  01/15/2013   *RADIOLOGY REPORT*  Clinical Data: Slurred speech.  Loss of mobility.  CHEST - 2 VIEW  Comparison: 01/12/2013.  Findings: Stable mildly enlarged cardiac silhouette and hyperexpansion of the lungs.  Stable mild diffuse peribronchial thickening.  Interval small right pleural effusion.  Stable small amount of left pleural thickening or fluid.  The upper lung zone pulmonary vasculature remains prominent.  Improved aeration at the lung bases.  Diffuse osteopenia.  IMPRESSION:  1.  Mild cardiomegaly and mild pulmonary vascular congestion. 2.  Interval small right pleural effusion. 3.  Stable small left pleural effusion or thickening. 4.  Resolved atelectasis at the lung bases. 5.  Stable changes of COPD and chronic bronchitis.   Original Report Authenticated By: Beckie Salts, M.D.   Ct Head Wo Contrast  01/15/2013   *RADIOLOGY REPORT*  Clinical Data: TIA  CT HEAD WITHOUT CONTRAST  Technique:  Contiguous axial images were obtained from the base of the skull through the vertex without contrast.  Comparison: None.  Findings: No skull fracture is noted.  No intracranial hemorrhage, mass effect or midline shift.  Minimal cerebral atrophy.  Mild periventricular white matter decreased attenuation probable due to chronic small vessel ischemic changes.  No acute cortical infarction.  No mass lesion is noted on this unenhanced scan. There is mucosal thickening with air-fluid level bilateral maxillary sinus.  IMPRESSION: No acute intracranial abnormality.  Mild cerebral atrophy. Mucosal thickening bilateral maxillary sinus.   Original Report Authenticated By: Natasha Mead, M.D.    CT of the brain    IMPRESSION:  No acute intracranial abnormality. Mild cerebral atrophy. Mucosal  thickening bilateral maxillary sinus.  MRI of the brain    MRA of the brain    2D Echocardiogram   Study Conclusions  - Left ventricle: There is motion at the base. There is severe hypokinesis in all of the other segments. The motion is c/w Takotsubo.  The cavity size was normal. Wall thickness was increased in a pattern of moderate LVH. Systolic function was severely reduced. The estimated ejection fraction was in the range of 25% to 30%. - Right ventricle: There is mild/moderate RV dysfunction, but the RV is not dilated.   Carotid Doppler    CXR    EKG   Normal sinus rhythm Possible Anterior infarct , age undetermined T wave abnormality, consider lateral ischemia  Therapy Recommendations No PT needed  Physical Exam  General: The patient is alert and cooperative at the time of the examination.  Skin: No significant peripheral edema is noted.   Neurologic Exam  Cranial nerves: Facial symmetry is present. Speech is normal, no aphasia or dysarthria is noted. Extraocular movements are full. Visual fields are full.  Motor: The patient has good strength in all 4 extremities.  Coordination: The patient has good finger-nose-finger and heel-to-shin bilaterally.  Gait and station: The gait was not tested. No drift is seen.  Reflexes: Deep tendon reflexes are symmetric.    ASSESSMENT Ms. Deanna Brown is a 67 y.o. female presenting with transient slurred speech and right-sided weakness. The patient was just discharged with a myocardial infarction. The patient was   on aspirin prior to admission.   HTN  Dyslipidemia  Recent MI  GERD  Clinical TIA left brain  EF 25% by 2 D echo  Pulmonary HTN  Hospital day # 1  TREATMENT/PLAN  Plavix therapy  MRI brain  MRA head  Doppler  Will follow  WILLIS,CHARLES KEITH  01/16/2013 10:28 AM

## 2013-01-16 NOTE — Consult Note (Addendum)
CARDIOLOGY CONSULT NOTE  Patient ID: Deanna Brown MRN: [id number], DOB/AGE: [birth year]   Admit date: 01/15/2013 Date of Consult: 01/16/2013  Primary Physician: Geraldo Pitter, MD Primary Cardiologist: Lovena Neighbours, MD  Pt. 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  Problem List  Past Medical History  Diagnosis Date  . Hypertension   . Asthma   . Seasonal allergies   . High cholesterol   . GERD (gastroesophageal reflux disease)   . Uterine cancer   . Takotsubo cardiomyopathy     a. NSTEMI 2/2 takotsubo 01/2013, no significant CAD by cath, EF 25%;  b. 01/2013 Echo: EF 25-30%, mod LVH, Sev HK in all segments except base. Mild/mod RV dysfxn, PASP .  Marland Kitchen Pulmonary HTN     a. Mod pulm HTN by cath 01/2013 (PASP ).  . PVC's (premature ventricular contractions)     a. Noted during 01/2013 admission.    Past Surgical History  Procedure Laterality Date  . Abdominal hysterectomy  1999     Allergies  Allergies  Allergen Reactions  . Cetirizine Hcl Itching    HPI   67 y/o female that was admitted on 01/10/2013 after noting tachypalpitations being found to be in sinus tach by her PCP.  She was seen in the ED and her troponin was found to be elevated @ 8.  She was transferred to [facility] and underwent cath showing relatively nl cors with severe LV dysfxn with an EF of 25-30% with motion at the base but severe HK in all other segments consistent with Takotsubo cardiomyopathy.  She was treated with bb/arb therapy and discharged home on Friday 6/13.  She did well on Saturday, but yesterday, a friend noted that she was slurring her speech and the patient felt wkns on the right side of her body.  She was noted to have twitching of her right eye with right sided facial droop (though pt says that she thinks it was her left side).  She presented to the ED within an hour of onset of  Ss and by the time she arrived, Ss had resolved.  Head CT showed no acute findings.  She was admitted by IM and seen by neuro.  MRI is pending.  She's had no recurrent wkns.  She denies chest pain, palpitations, dyspnea, pnd, orthopnea, n, v, dizziness, syncope, edema, weight gain, or early satiety.  Inpatient Medications  . atorvastatin  10 mg Oral q1800  . carvedilol  25 mg Oral BID WC  . clopidogrel  75 mg Oral Q breakfast  . enoxaparin (LOVENOX) injection  40 mg Subcutaneous Q24H  . fluticasone  1 spray Each Nare Daily  . irbesartan  300 mg Oral Daily  . loratadine  10 mg Oral Daily  . mometasone-formoterol  2 puff Inhalation BID  . multivitamin with minerals  1 tablet Oral Daily  . pantoprazole  40 mg Oral Daily    Family History History reviewed. No pertinent family history.   Social History History   Social History  . Marital Status: Widowed    Spouse Name: N/A    Number of Children: N/A  . Years of Education: N/A   Occupational History  . Not on file.   Social History Main Topics  . Smoking status: Never Smoker   . Smokeless tobacco: Never Used  . Alcohol Use: No  . Drug Use: No  . Sexually Active: No  Other Topics Concern  . Not on file   Social History Narrative   Lives in [place] with her son.  Her dtr also lives nearby.    Review of Systems  General:  No chills, fever, night sweats or weight changes.  Cardiovascular:  No chest pain, dyspnea on exertion, edema, orthopnea, palpitations, paroxysmal nocturnal dyspnea. Dermatological: No rash, lesions/masses Respiratory: No cough, dyspnea Urologic: No hematuria, dysuria Abdominal:   No nausea, vomiting, diarrhea, bright red blood per rectum, melena, or hematemesis Neurologic:  +++ visual changes, wkns, changes in mental status as outlined above. All other systems reviewed and are otherwise negative except as noted above.  Physical Exam  Blood pressure 151/85, pulse 78, temperature 97.9 F (36.6 C),  temperature source Oral, resp. rate 20, height 5\' 1"  (1.549 m), weight 111 lb 15.9 oz (50.8 kg), SpO2 100.00%.  General: Pleasant, NAD Psych: Normal affect. Neuro: Alert and oriented X 3. Moves all extremities spontaneously. HEENT: Normal  Neck: Supple without bruits JVP 8-10 cm Lungs:  Resp regular and unlabored, CTA. Heart: RRR no s3, s4, or murmurs. Abdomen: Soft, non-tender, non-distended, BS + x 4.  Extremities: No clubbing, cyanosis  Trace edema. DP/PT/Radials 2+ and equal bilaterally.  Labs   Recent Labs  01/15/13 1615  TROPONINI 1.15*   Lab Results  Component Value Date   WBC 5.4 01/15/2013   HGB 13.3 01/15/2013   HCT 39.0 01/15/2013   MCV 82.4 01/15/2013   PLT 365 01/15/2013     Recent Labs Lab 01/13/13 0420 01/15/13 1431  NA 132* 134*  K 4.2 3.6  CL 98 100  CO2 27  --   BUN 14 11  CREATININE 0.91 0.90  CALCIUM 8.4  --   PROT 6.7  --   BILITOT 0.9  --   ALKPHOS 58  --   ALT 9  --   AST 21  --   GLUCOSE 100* 91   Lab Results  Component Value Date   CHOL 127 01/16/2013   HDL 30* 01/16/2013   LDLCALC 78 01/16/2013   TRIG 94 01/16/2013   Lab Results  Component Value Date   DDIMER 0.85* 01/12/2013   Radiology/Studies  Dg Chest 2 View  01/15/2013   *RADIOLOGY REPORT*  Clinical Data: Slurred speech.  Loss of mobility.  CHEST - 2 VIEW  Comparison: 01/12/2013.  Findings: Stable mildly enlarged cardiac silhouette and hyperexpansion of the lungs.  Stable mild diffuse peribronchial thickening.  Interval small right pleural effusion.  Stable small amount of left pleural thickening or fluid.  The upper lung zone pulmonary vasculature remains prominent.  Improved aeration at the lung bases.  Diffuse osteopenia.  IMPRESSION:  1.  Mild cardiomegaly and mild pulmonary vascular congestion. 2.  Interval small right pleural effusion. 3.  Stable small left pleural effusion or thickening. 4.  Resolved atelectasis at the lung bases. 5.  Stable changes of COPD and chronic  bronchitis.   Original Report Authenticated By: Beckie Salts, M.D.   Ct Head Wo Contrast  01/15/2013   *RADIOLOGY REPORT*  Clinical Data: TIA  CT HEAD WITHOUT CONTRAST  Technique:  Contiguous axial images were obtained from the base of the skull through the vertex without contrast.  Comparison: None.  Findings: No skull fracture is noted.  No intracranial hemorrhage, mass effect or midline shift.  Minimal cerebral atrophy.  Mild periventricular white matter decreased attenuation probable due to chronic small vessel ischemic changes.  No acute cortical infarction.  No mass lesion is noted on  this unenhanced scan. There is mucosal thickening with air-fluid level bilateral maxillary sinus.  IMPRESSION: No acute intracranial abnormality.  Mild cerebral atrophy. Mucosal thickening bilateral maxillary sinus.   Original Report Authenticated By: Natasha Mead, M.D.   ECG  Rsr, 86, poor r prog, deep twi I, aVL.  TWI similar to that seen on ECG's from 6/10 though at that time TWI was diffuse.  TWI not present on ECG 6/11.  ASSESSMENT AND PLAN  1.  TIA:  Seen by neuro.  MRI pending.  She was placed on ASA and plavix today.  In light of recent Takotsubo event with apical LV defect and EF of 25-30%, she is at risk for apical thrombus.  We will repeat echo to eval LV fxn and r/o thrombus.  If either echo shows LV thrombus or MRI suggests embolus, would plan to start or anticoagulation.  2.  Takotsubo Cardiomyopathy:  See discussion above.  Repeat echo to r/o LV thrombus.  Euvolemic on exam.  Cont bb/arb.  3.  Elevated troponin:  Likely represents downward trend from last week.  No chest pain or dyspnea.  Cont asa, statin, bb.  4.  HTN:  BP currently elevated.  Follow and titrate meds as necessary.  Signed, Nicolasa Ducking, NP 01/16/2013, 1:07 PM    The concern is the source of the clot and this informs risk reduction therapy, currently on plavix and asa, but if apical trhombus were identified then coumadin  would be appropriate. If echo is unrevealing would use   MRI which has increased sensitivity

## 2013-01-16 NOTE — Evaluation (Signed)
Physical Therapy Evaluation Patient Details Name: Deanna Brown MRN: [id number] DOB: February 09, 1946 Today's Date: 01/16/2013 Time: 5784-6962 PT Time Calculation (min): 13 min  PT Assessment / Plan / Recommendation Clinical Impression  Patient is a 67 yo female admitted with TIA.  Symptoms now resolved.  Patient independent with all mobility and gait.  Scored 23/24 on DGI balance assessment.  No acute PT needs identified - PT will sign off.    PT Assessment  Patent does not need any further PT services    Follow Up Recommendations  No PT follow up    Does the patient have the potential to tolerate intense rehabilitation      Barriers to Discharge        Equipment Recommendations  None recommended by PT    Recommendations for Other Services     Frequency      Precautions / Restrictions Precautions Precautions: None Restrictions Weight Bearing Restrictions: No   Pertinent Vitals/Pain       Mobility  Bed Mobility Bed Mobility: Supine to Sit Supine to Sit: 7: Independent Transfers Transfers: Sit to Stand;Stand to Sit Sit to Stand: 7: Independent;From bed Stand to Sit: 7: Independent;To bed Ambulation/Gait Ambulation/Gait Assistance: 7: Independent Ambulation Distance (Feet): 250 Feet Assistive device: None Ambulation/Gait Assistance Details: Patient with good gait pattern, speed, and balance Gait Pattern: Within Functional Limits Gait velocity: WFL Stairs: Yes Stairs Assistance: 6: Modified independent (Device/Increase time) Stair Management Technique: One rail Right;Forwards;Alternating pattern Number of Stairs: 3 Modified Rankin (Stroke Patients Only) Pre-Morbid Rankin Score: No symptoms Modified Rankin: No symptoms     PT Goals  N/A  Visit Information  Last PT Received On: 01/16/13 Assistance Needed: +1    Subjective Data  Subjective: "I'm back to normal now" Patient Stated Goal: To go home   Prior Functioning  Home Living Lives With:  Son Available Help at Discharge: Family;Available 24 hours/day Type of Home: House Home Access: Level entry Home Layout: One level Home Adaptive Equipment: None Prior Function Level of Independence: Independent Able to Take Stairs?: Yes Driving: No Vocation: Retired Musician: No difficulties Dominant Hand: Left    Cognition  Cognition Arousal/Alertness: Awake/alert Behavior During Therapy: WFL for tasks assessed/performed Overall Cognitive Status: Within Functional Limits for tasks assessed    Extremity/Trunk Assessment Right Upper Extremity Assessment RUE ROM/Strength/Tone: WFL for tasks assessed RUE Sensation: WFL - Light Touch RUE Coordination: WFL - fine motor Left Upper Extremity Assessment LUE ROM/Strength/Tone: WFL for tasks assessed LUE Sensation: WFL - Light Touch LUE Coordination: WFL - fine motor Right Lower Extremity Assessment RLE ROM/Strength/Tone: WFL for tasks assessed RLE Sensation: WFL - Light Touch RLE Coordination: WFL - gross motor Left Lower Extremity Assessment LLE ROM/Strength/Tone: WFL for tasks assessed LLE Sensation: WFL - Light Touch LLE Coordination: WFL - gross motor Trunk Assessment Trunk Assessment: Normal   Balance Balance Balance Assessed: Yes Standardized Balance Assessment Standardized Balance Assessment: Dynamic Gait Index Dynamic Gait Index Level Surface: Normal Change in Gait Speed: Normal Gait with Horizontal Head Turns: Normal Gait with Vertical Head Turns: Normal Gait and Pivot Turn: Normal Step Over Obstacle: Normal Step Around Obstacles: Normal Steps: Mild Impairment Total Score: 23  End of Session PT - End of Session Activity Tolerance: Patient tolerated treatment well Patient left: in bed;with call bell/phone within reach;with family/visitor present (sitting EOB) Nurse Communication: Mobility status  GP Functional Assessment Tool Used: DGI balance assessment and clinical judgement Functional  Limitation: Mobility: Walking and moving around Mobility: Walking and Moving Around  Current Status [phone]): 0 percent impaired, limited or restricted Mobility: Walking and Moving Around Goal Status [phone]): 0 percent impaired, limited or restricted Mobility: Walking and Moving Around Discharge Status [phone]): 0 percent impaired, limited or restricted   Vena Austria 01/16/2013, 8:53 AM Durenda Hurt. Renaldo Fiddler, [facility] Acute Rehab Services Pager [phone]

## 2013-01-16 NOTE — Progress Notes (Signed)
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  Started on that admission on Asa 81 mg, lipitor 10 daily.  Re-presented to [facility] 01/15/13 pm with Stroke like symptoms mainly slurred speech and R facial droop and R sided weakness.  She quickly returned to her usual baseline  Past medical history-As per Problem list Chart reviewed as below- H/o Esophageal stricture, s/p dilatation  Consultants:  Cardiology  Neurology  Procedures:  Ct head  Antibiotics:  none   Subjective   doing well.  Ambulant in room No cp n focal deficit right now   Objective    Interim History: nad   Telemetry: nad  Objective: Filed Vitals:   01/16/13 0400 01/16/13 0600 01/16/13 0807 01/16/13 0815  BP: 121/90 127/80  151/85  Pulse: 75 84  78  Temp: 98 F (36.7 C) 98.4 F (36.9 C)  97.9 F (36.6 C)  TempSrc: Oral Oral  Oral  Resp: 18 18  20   Height:      Weight: 50.8 kg (111 lb 15.9 oz)     SpO2: 99% 98% 100% 100%    Intake/Output Summary (Last 24 hours) at 01/16/13 1300 Last data filed at 01/16/13 0842  Gross per 24 hour  Intake    360 ml  Output      0 ml  Net    360 ml    Exam:  General: alert pleasant oriented, no facial assym Cardiovascular: s1 s2 no m/r/g Respiratory: clear Abdomen: soft Skinnad NeuroPower 5/5, sensory iontact, eflexes equivocal Cerebellar sings neg  Data Reviewed: Basic Metabolic Panel:  Recent Labs Lab 01/10/13 1857 01/11/13 0200  01/11/13 1635 01/12/13 0942 01/13/13 0420 01/15/13 1431  NA 131* 133*  --   --  132* 132* 134*  K 3.1* 2.8*  < >  --  3.1* 4.2 3.6  CL 93* 99  --   --  96 98 100  CO2 26 23  --   --  26 27  --   GLUCOSE 111* 142*  --   --  141* 100* 91  BUN 10 10  --   --  9 14 11   CREATININE 0.73 0.65  --  0.74 0.75 0.91  0.90  CALCIUM 9.9 8.8  --   --  8.7 8.4  --   MG  --   --   --   --  1.9  --   --   < > = values in this interval not displayed. Liver Function Tests:  Recent Labs Lab 01/10/13 1857 01/13/13 0420  AST 50* 21  ALT 15 9  ALKPHOS 68 58  BILITOT 0.6 0.9  PROT 8.6* 6.7  ALBUMIN 3.5 2.6*   No results found for this basename: LIPASE, AMYLASE,  in the last 168 hours No results found for this basename: AMMONIA,  in the last 168 hours CBC:  Recent Labs Lab 01/10/13 1857  01/11/13 1635 01/12/13 0452 01/13/13 0420 01/14/13 0330 01/15/13 1420 01/15/13 1431  WBC 12.2*  < > 11.3* 11.0* 9.6 7.3 5.4  --   NEUTROABS 8.8*  --   --   --   --   --   --   --   HGB 15.1*  < > 13.5 13.2 12.1 11.8* 13.4 13.3  HCT 42.0  < > 39.0 38.5 34.6* 33.9*  38.5 39.0  MCV 80.6  < > 82.1 82.6 81.6 82.5 82.4  --   PLT 393  < > 345 307 286 297 365  --   < > = values in this interval not displayed. Cardiac Enzymes:  Recent Labs Lab 01/10/13 2000 01/10/13 2336 01/11/13 0200 01/11/13 1102 01/15/13 1615  TROPONINI 13.69* 11.71* 10.66* 7.96* 1.15*   BNP: No components found with this basename: POCBNP,  CBG:  Recent Labs Lab 01/15/13 1419  GLUCAP 88    Recent Results (from the past 240 hour(s))  MRSA PCR SCREENING     Status: None   Collection Time    01/10/13 10:11 PM      Result Value Range Status   MRSA by PCR NEGATIVE  NEGATIVE Final   Comment:            The GeneXpert MRSA Assay (FDA     approved for NASAL specimens     only), is one component of a     comprehensive MRSA colonization     surveillance program. It is not     intended to diagnose MRSA     infection nor to guide or     monitor treatment for     MRSA infections.     Studies:              All Imaging reviewed and is as per above notation   Scheduled Meds: . atorvastatin  10 mg Oral q1800  . carvedilol  25 mg Oral BID WC  . clopidogrel  75 mg Oral Q breakfast  . enoxaparin (LOVENOX) injection  40 mg Subcutaneous  Q24H  . fluticasone  1 spray Each Nare Daily  . irbesartan  300 mg Oral Daily  . loratadine  10 mg Oral Daily  . mometasone-formoterol  2 puff Inhalation BID  . multivitamin with minerals  1 tablet Oral Daily  . pantoprazole  40 mg Oral Daily   Continuous Infusions:    Assessment/Plan: 1. Acute CVA Medial R thalamus/L occiput-Per neurology.  Unclear if 2/2 to Cardiac thrombus 2/2 to Takotsubo-currently on Plavix 75 daily from asa.  Await Carotids/Repeat echo.  Unclear if needs TEE 2. NSTEMI-Per cardiology-expect troponin's to trend down.  Appreciate input 3. Pulm htn-consider NItrates/Hydralazine 4. Htn-continue Coreg 25 bid, Irbesartan 300 daily 5. Hld-continue Atrovastatin 10 daily 6. Gerd-continue Pantoprazole 40 daily  Code Status: full Family Communication:  Brother + at bedside Disposition Plan:  inpatient   Pleas Koch, MD  [facility] Pager [phone] 01/16/2013, 1:00 PM    LOS: 1 day

## 2013-01-17 DIAGNOSIS — R4789 Other speech disturbances: Secondary | ICD-10-CM

## 2013-01-17 DIAGNOSIS — I369 Nonrheumatic tricuspid valve disorder, unspecified: Secondary | ICD-10-CM

## 2013-01-17 DIAGNOSIS — G459 Transient cerebral ischemic attack, unspecified: Secondary | ICD-10-CM

## 2013-01-17 LAB — GLUCOSE, CAPILLARY: Glucose-Capillary: 69 mg/dL — ABNORMAL LOW (ref 70–99)

## 2013-01-17 NOTE — Evaluation (Signed)
Occupational Therapy Evaluation Patient Details Name: Deanna Brown MRN: [id number] DOB: [birth year] Today's Date: 01/17/2013 Time: 4098-1191 OT Time Calculation (min): 20 min  OT Assessment / Plan / Recommendation Clinical Impression  Pt is a 67 y/o female w/ dx TIA, she initially presented w/ weakness and slurred speech. She reports that her symptoms have resolved and was observed to be independent w/ ADL's and functional mobility/transfers. She will have family assist PRN @ d/c. No OT needs, will sign off    OT Assessment  Patient does not need any further OT services    Follow Up Recommendations  No OT follow up    Barriers to Discharge      Equipment Recommendations  None recommended by OT    Recommendations for Other Services    Frequency       Precautions / Restrictions Precautions Precautions: None Restrictions Weight Bearing Restrictions: No   Pertinent Vitals/Pain No pain, 0/10    ADL  Eating/Feeding: Performed;Independent Where Assessed - Eating/Feeding: Chair Grooming: Performed;Wash/dry hands;Wash/dry face;Independent Where Assessed - Grooming: Unsupported standing Upper Body Bathing: Simulated;Modified independent Where Assessed - Upper Body Bathing: Unsupported sitting Lower Body Bathing: Simulated;Modified independent Where Assessed - Lower Body Bathing: Unsupported sit to stand Upper Body Dressing: Simulated;Independent Where Assessed - Upper Body Dressing: Unsupported sitting Lower Body Dressing: Performed;Independent Where Assessed - Lower Body Dressing: Unsupported sit to stand Toilet Transfer: Performed;Independent Toilet Transfer Method: Sit to Barista: Comfort height toilet Toileting - Clothing Manipulation and Hygiene: Performed;Independent Where Assessed - Toileting Clothing Manipulation and Hygiene: Standing;Sit on 3-in-1 or toilet Tub/Shower Transfer: Simulated;Modified independent Tub/Shower Transfer Method:  Science writer: Walk in shower Equipment Used: Gait belt Transfers/Ambulation Related to ADLs: Pt is independent w/ functional mobility and transfers at this time. ADL Comments: Pt is a 67 y/o female w/ dx TIA, she initially presented w/ weakness and slurred speech. She reports that her symptoms have resolved and was observed to be independent w/ ADL's and functional mobility/transfers. She will have family assist PRN @ d/c. No OT needs, will sign off.    OT Diagnosis:    OT Problem List:   OT Treatment Interventions:     OT Goals    Visit Information  Last OT Received On: 01/17/13    Subjective Data  Subjective: "I was having some slurred speech and laughing" Patient Stated Goal: Return home when able   Prior Functioning     Home Living Lives With: Son Available Help at Discharge: Family;Available 24 hours/day Type of Home: House Home Access: Level entry Home Layout: One level Bathroom Shower/Tub: Engineer, manufacturing systems: Standard Home Adaptive Equipment: None Prior Function Level of Independence: Independent Able to Take Stairs?: Yes Driving: No Vocation: Retired Musician: No difficulties Dominant Hand: Left    Vision/Perception Vision - History Baseline Vision: Wears glasses only for reading Patient Visual Report: No change from baseline   Cognition  Cognition Arousal/Alertness: Awake/alert Behavior During Therapy: WFL for tasks assessed/performed Overall Cognitive Status: Within Functional Limits for tasks assessed    Extremity/Trunk Assessment Right Upper Extremity Assessment RUE ROM/Strength/Tone: WFL for tasks assessed RUE Sensation: WFL - Light Touch RUE Coordination: WFL - fine motor Left Upper Extremity Assessment LUE ROM/Strength/Tone: WFL for tasks assessed LUE Sensation: WFL - Light Touch LUE Coordination: WFL - fine motor    Mobility Transfers Transfers: Sit to Stand;Stand to Sit Sit to  Stand: 7: Independent;From chair/3-in-1;From toilet;Without upper extremity assist Stand to Sit: 7: Independent;To chair/3-in-1;To toilet;Without upper  extremity assist       Balance Balance Balance Assessed:  (See PT notes)   End of Session OT - End of Session Equipment Utilized During Treatment: Gait belt Activity Tolerance: Patient tolerated treatment well Patient left: in chair;with call bell/phone within reach;with family/visitor present  GO Functional Assessment Tool Used: Clinical judgement Functional Limitation: Self care Self Care Current Status (Z6109): 0 percent impaired, limited or restricted Self Care Goal Status (U0454): 0 percent impaired, limited or restricted Self Care Discharge Status [phone]): 0 percent impaired, limited or restricted   Roselie Awkward Dixon 01/17/2013, 1:00 PM

## 2013-01-17 NOTE — Progress Notes (Signed)
Stroke Team Progress Note  HISTORY Deanna Brown is a 67 y.o. female discharge from [facility] yesterday 01/14/2013 following admission for an MI. The patient had returned home where she lives with her son. The patient's son stated that he left the house at approximately 8:00 am this morning and at that time his mother was fine. His mother later had a friend stop by who left at approximately 11:30 AM today. Again at that time the patient was fine. Around noon today family members stopped by and noted that the patient had dysarthria as well as a right facial droop and was having difficulty walking secondary to right-sided weakness. The patient was brought to the emergency department where a CT scan was performed that showed no acute abnormality. The patient's symptoms have since resolved and she feels back to baseline. Neurology was asked to see the patient for possible TIA.   Patient was not a TPA candidate secondary to minimal deficit.  SUBJECTIVE Her son is at bedside. The patient indicates that she is at or near baseline. No residual deficits. For an  OBJECTIVE Most recent Vital Signs: Filed Vitals:   01/16/13 2000 01/17/13 0000 01/17/13 0400 01/17/13 0800  BP: 102/73 127/74 144/94 151/81  Pulse: 82 80 84 86  Temp: 98.1 F (36.7 C) 98.2 F (36.8 C) 98.2 F (36.8 C) 97.9 F (36.6 C)  TempSrc: Oral Oral Oral Oral  Resp: 18 18 18 18   Height:      Weight:   113 lb 1.5 oz (51.3 kg)   SpO2: 100% 98% 100% 99%   CBG (last 3)   Recent Labs  01/15/13 1419  GLUCAP 88    IV Fluid Intake:     MEDICATIONS  . atorvastatin  10 mg Oral q1800  . carvedilol  25 mg Oral BID WC  . clopidogrel  75 mg Oral Q breakfast  . enoxaparin (LOVENOX) injection  40 mg Subcutaneous Q24H  . fluticasone  1 spray Each Nare Daily  . irbesartan  300 mg Oral Daily  . loratadine  10 mg Oral Daily  . mometasone-formoterol  2 puff Inhalation BID  . multivitamin with minerals  1 tablet Oral Daily   . pantoprazole  40 mg Oral Daily   PRN:  acetaminophen, acetaminophen, senna-docusate  Diet:  Cardiac thin liquids Activity:   Up with assistance DVT Prophylaxis:  lovenox  CLINICALLY SIGNIFICANT STUDIES Basic Metabolic Panel:  Recent Labs Lab 01/11/13 0200  01/12/13 0942 01/13/13 0420 01/15/13 1431  NA 133*  --  132* 132* 134*  K 2.8*  < > 3.1* 4.2 3.6  CL 99  --  96 98 100  CO2 23  --  26 27  --   GLUCOSE 142*  --  141* 100* 91  BUN 10  --  9 14 11   CREATININE 0.65  < > 0.75 0.91 0.90  CALCIUM 8.8  --  8.7 8.4  --   MG  --   --  1.9  --   --   < > = values in this interval not displayed. Liver Function Tests:   Recent Labs Lab 01/10/13 1857 01/13/13 0420  AST 50* 21  ALT 15 9  ALKPHOS 68 58  BILITOT 0.6 0.9  PROT 8.6* 6.7  ALBUMIN 3.5 2.6*   CBC:  Recent Labs Lab 01/10/13 1857  01/14/13 0330 01/15/13 1420 01/15/13 1431  WBC 12.2*  < > 7.3 5.4  --   NEUTROABS 8.8*  --   --   --   --  HGB 15.1*  < > 11.8* 13.4 13.3  HCT 42.0  < > 33.9* 38.5 39.0  MCV 80.6  < > 82.5 82.4  --   PLT 393  < > 297 365  --   < > = values in this interval not displayed. Coagulation:   Recent Labs Lab 01/10/13 1940 01/15/13 1615  LABPROT 13.4 13.0  INR 1.03 0.99   Cardiac Enzymes:   Recent Labs Lab 01/11/13 0200 01/11/13 1102 01/15/13 1615  TROPONINI 10.66* 7.96* 1.15*   Urinalysis:   Recent Labs Lab 01/12/13 1534 01/15/13 1627  COLORURINE YELLOW YELLOW  LABSPEC 1.008 1.009  PHURINE 7.5 7.0  GLUCOSEU NEGATIVE NEGATIVE  HGBUR NEGATIVE NEGATIVE  BILIRUBINUR NEGATIVE NEGATIVE  KETONESUR NEGATIVE NEGATIVE  PROTEINUR NEGATIVE NEGATIVE  UROBILINOGEN 0.2 1.0  NITRITE NEGATIVE NEGATIVE  LEUKOCYTESUR TRACE* NEGATIVE   Lipid Panel    Component Value Date/Time   CHOL 127 01/16/2013 0420   TRIG 94 01/16/2013 0420   HDL 30* 01/16/2013 0420   CHOLHDL 4.2 01/16/2013 0420   VLDL 19 01/16/2013 0420   LDLCALC 78 01/16/2013 0420   HgbA1C  Lab Results  Component  Value Date   HGBA1C 5.7* 01/16/2013    Urine Drug Screen:     Component Value Date/Time   LABOPIA NONE DETECTED 01/15/2013 1627   COCAINSCRNUR NONE DETECTED 01/15/2013 1627   LABBENZ NONE DETECTED 01/15/2013 1627   AMPHETMU NONE DETECTED 01/15/2013 1627   THCU NONE DETECTED 01/15/2013 1627   LABBARB NONE DETECTED 01/15/2013 1627    Alcohol Level:   Recent Labs Lab 01/15/13 1615  ETH <11    Dg Chest 2 View  01/15/2013   *RADIOLOGY REPORT*  Clinical Data: Slurred speech.  Loss of mobility.  CHEST - 2 VIEW  Comparison: 01/12/2013.  Findings: Stable mildly enlarged cardiac silhouette and hyperexpansion of the lungs.  Stable mild diffuse peribronchial thickening.  Interval small right pleural effusion.  Stable small amount of left pleural thickening or fluid.  The upper lung zone pulmonary vasculature remains prominent.  Improved aeration at the lung bases.  Diffuse osteopenia.  IMPRESSION:  1.  Mild cardiomegaly and mild pulmonary vascular congestion. 2.  Interval small right pleural effusion. 3.  Stable small left pleural effusion or thickening. 4.  Resolved atelectasis at the lung bases. 5.  Stable changes of COPD and chronic bronchitis.   Original Report Authenticated By: Beckie Salts, M.D.   Ct Head Wo Contrast  01/15/2013   *RADIOLOGY REPORT*  Clinical Data: TIA  CT HEAD WITHOUT CONTRAST  Technique:  Contiguous axial images were obtained from the base of the skull through the vertex without contrast.  Comparison: None.  Findings: No skull fracture is noted.  No intracranial hemorrhage, mass effect or midline shift.  Minimal cerebral atrophy.  Mild periventricular white matter decreased attenuation probable due to chronic small vessel ischemic changes.  No acute cortical infarction.  No mass lesion is noted on this unenhanced scan. There is mucosal thickening with air-fluid level bilateral maxillary sinus.  IMPRESSION: No acute intracranial abnormality.  Mild cerebral atrophy. Mucosal thickening  bilateral maxillary sinus.   Original Report Authenticated By: Natasha Mead, M.D.   Mr Brain Wo Contrast  01/16/2013   *RADIOLOGY REPORT*  Clinical Data:  Hypertensive patient with history of hypercholesterolemia and cardiomyopathy presenting with dysarthria and right facial droop with right-sided weakness.  MRI BRAIN WITHOUT CONTRAST MRA HEAD WITHOUT CONTRAST  Technique: Multiplanar, multiecho pulse sequences of the brain and surrounding structures were obtained according to standard  protocol without intravenous contrast.  Angiographic images of the head were obtained using MRA technique without contrast.  Comparison: 01/15/2013 CT.  No comparison MR.  MRI HEAD  Findings:  Acute non hemorrhagic infarcts involving the medial aspect of the right thalamus and left occipital lobe.  No intracranial hemorrhage.  Mild to slightly moderate small vessel disease type changes.  No hydrocephalus.  No intracranial mass lesion detected on this unenhanced exam.  Slightly heterogeneous appearance bone marrow without focal mass identified may reflect changes of the patient's mild anemia.  Cervical medullary junction, pituitary region, pineal region and orbital structures unremarkable.  Paranasal sinus mucosal thickening / opacification with air fluid level right maxillary sinus which may reflect changes of acute sinusitis.  IMPRESSION: Acute non hemorrhagic infarcts involving the medial aspect of the right thalamus and left occipital lobe.  No intracranial hemorrhage.  Mild to slightly moderate small vessel disease type changes.  Paranasal sinus mucosal thickening / opacification with air fluid level right maxillary sinus which may reflect changes of acute sinusitis.  MRA HEAD  Findings: Motion degraded exam.  Ectatic cavernous segment and supraclinoid segment of the internal carotid artery bilaterally with regions of mild and  irregularity narrowing.  Moderate narrowing involving portions of the A1 segment of the anterior cerebral  artery bilaterally more notable on the right.  Mild to moderate narrowing distal M1 segment right middle cerebral artery.  Middle cerebral artery moderate branch vessel irregularity and narrowing.  Right vertebral artery is dominant.  Narrowing and mild irregularity of the distal vertebral arteries without high-grade stenosis.  Mild irregularity and narrowing basilar artery.  No high-grade stenosis.  Nonvisualization AICAs.  Mild to moderate narrowing proximal P1 segment right posterior cerebral artery.  Mild irregularity posterior cerebral artery distal branches.  Narrowing irregularity superior cerebellar arteries.  No obvious aneurysm.  IMPRESSION: Exam is motion degraded and evaluation limited with findings suggesting branch vessel atherosclerotic type changes as detailed above.  Critical Value/emergent results were called by telephone at the time of interpretation on 01/16/2013 at 3:15 p.m. to Vernona Rieger the patient's nurse, who verbally acknowledged these results.   Original Report Authenticated By: Lacy Duverney, M.D.   Mr Mra Head/brain Wo Cm  01/16/2013   *RADIOLOGY REPORT*  Clinical Data:  Hypertensive patient with history of hypercholesterolemia and cardiomyopathy presenting with dysarthria and right facial droop with right-sided weakness.  MRI BRAIN WITHOUT CONTRAST MRA HEAD WITHOUT CONTRAST  Technique: Multiplanar, multiecho pulse sequences of the brain and surrounding structures were obtained according to standard protocol without intravenous contrast.  Angiographic images of the head were obtained using MRA technique without contrast.  Comparison: 01/15/2013 CT.  No comparison MR.  MRI HEAD  Findings:  Acute non hemorrhagic infarcts involving the medial aspect of the right thalamus and left occipital lobe.  No intracranial hemorrhage.  Mild to slightly moderate small vessel disease type changes.  No hydrocephalus.  No intracranial mass lesion detected on this unenhanced exam.  Slightly heterogeneous  appearance bone marrow without focal mass identified may reflect changes of the patient's mild anemia.  Cervical medullary junction, pituitary region, pineal region and orbital structures unremarkable.  Paranasal sinus mucosal thickening / opacification with air fluid level right maxillary sinus which may reflect changes of acute sinusitis.  IMPRESSION: Acute non hemorrhagic infarcts involving the medial aspect of the right thalamus and left occipital lobe.  No intracranial hemorrhage.  Mild to slightly moderate small vessel disease type changes.  Paranasal sinus mucosal thickening / opacification with air fluid level  right maxillary sinus which may reflect changes of acute sinusitis.  MRA HEAD  Findings: Motion degraded exam.  Ectatic cavernous segment and supraclinoid segment of the internal carotid artery bilaterally with regions of mild and  irregularity narrowing.  Moderate narrowing involving portions of the A1 segment of the anterior cerebral artery bilaterally more notable on the right.  Mild to moderate narrowing distal M1 segment right middle cerebral artery.  Middle cerebral artery moderate branch vessel irregularity and narrowing.  Right vertebral artery is dominant.  Narrowing and mild irregularity of the distal vertebral arteries without high-grade stenosis.  Mild irregularity and narrowing basilar artery.  No high-grade stenosis.  Nonvisualization AICAs.  Mild to moderate narrowing proximal P1 segment right posterior cerebral artery.  Mild irregularity posterior cerebral artery distal branches.  Narrowing irregularity superior cerebellar arteries.  No obvious aneurysm.  IMPRESSION: Exam is motion degraded and evaluation limited with findings suggesting branch vessel atherosclerotic type changes as detailed above.  Critical Value/emergent results were called by telephone at the time of interpretation on 01/16/2013 at 3:15 p.m. to Vernona Rieger the patient's nurse, who verbally acknowledged these results.    Original Report Authenticated By: Lacy Duverney, M.D.    CT of the brain   IMPRESSION:  No acute intracranial abnormality. Mild cerebral atrophy. Mucosal  thickening bilateral maxillary sinus.  MRI of the brain   IMPRESSION:  Acute non hemorrhagic infarcts involving the medial aspect of the  right thalamus and left occipital lobe.  No intracranial hemorrhage.  Mild to slightly moderate small vessel disease type changes.  Paranasal sinus mucosal thickening / opacification with air fluid  level right maxillary sinus which may reflect changes of acute  sinusitis.  MRA of the brain   IMPRESSION:  Exam is motion degraded and evaluation limited with findings  suggesting branch vessel atherosclerotic type changes as detailed  above.   2D Echocardiogram   Study Conclusions  - Left ventricle: There is motion at the base. There is severe hypokinesis in all of the other segments. The motion is c/w Takotsubo. The cavity size was normal. Wall thickness was increased in a pattern of moderate LVH. Systolic function was severely reduced. The estimated ejection fraction was in the range of 25% to 30%. - Right ventricle: There is mild/moderate RV dysfunction, but the RV is not dilated.   Carotid Doppler    CXR   IMPRESSION:  1. Mild cardiomegaly and mild pulmonary vascular congestion.  2. Interval small right pleural effusion.  3. Stable small left pleural effusion or thickening.  4. Resolved atelectasis at the lung bases.  5. Stable changes of COPD and chronic bronchitis  EKG   Normal sinus rhythm Possible Anterior infarct , age undetermined T wave abnormality, consider lateral ischemia  Therapy Recommendations No PT needed  Physical Exam  General: The patient is alert and cooperative at the time of the examination.  Skin: No significant peripheral edema is noted.   Neurologic Exam  Cranial nerves: Facial symmetry is present. Speech is normal, no aphasia or dysarthria is  noted. Extraocular movements are full. Visual fields are full.  Motor: The patient has good strength in all 4 extremities.  Coordination: The patient has good finger-nose-finger and heel-to-shin bilaterally.  Gait and station: The gait was not tested. No drift is seen.  Reflexes: Deep tendon reflexes are symmetric.    ASSESSMENT Deanna Brown is a 67 y.o. female presenting with transient slurred speech and right-sided weakness. The patient was just discharged with a myocardial infarction.  The patient was   on aspirin prior to admission.   HTN  Dyslipidemia  Recent MI  GERD  Clinical TIA left brain  EF 25% by 2 D echo, CM  Pulmonary HTN  Hospital day # 2  Cardiology is following. 2-D echocardiogram has been ordered. I would wonder whether a TEE may be needed to determine whether or not a mural clot is present. If the 2-D echocardiogram is felt to be adequate, the patient could stay on Plavix for the clot is present, anticoagulation may be needed. Currently, the patient is doing well.  TREATMENT/PLAN  Plavix therapy  Doppler  2 D echo, ? TEE, cardiology is following  Will follow  WILLIS,CHARLES KEITH  01/17/2013 8:34 AM

## 2013-01-17 NOTE — Evaluation (Signed)
Speech Language Pathology Evaluation Patient Details Name: Deanna Brown MRN: [id number] DOB: 1946/02/18 Today's Date: 01/17/2013 Time: 9629-5284 SLP Time Calculation (min): 7 min  Problem List:  Patient Active Problem List   Diagnosis Date Noted  . TIA (transient ischemic attack) 01/15/2013  . GERD (gastroesophageal reflux disease) 01/15/2013  . HTN (hypertension), benign 01/15/2013  . Takotsubo syndrome 01/12/2013  . Takotsubo cardiomyopathy 01/12/2013   Past Medical History:  Past Medical History  Diagnosis Date  . Hypertension   . Asthma   . Seasonal allergies   . High cholesterol   . GERD (gastroesophageal reflux disease)   . Uterine cancer   . Takotsubo cardiomyopathy     a. NSTEMI 2/2 takotsubo 01/2013, no significant CAD by cath, EF 25%;  b. 01/2013 Echo: EF 25-30%, mod LVH, Sev HK in all segments except base. Mild/mod RV dysfxn, PASP .  Marland Kitchen Pulmonary HTN     a. Mod pulm HTN by cath 01/2013 (PASP ).  . PVC's (premature ventricular contractions)     a. Noted during 01/2013 admission.   Past Surgical History:  Past Surgical History  Procedure Laterality Date  . Abdominal hysterectomy  1999   HPI:  67 y.o. female with history of hypertension, hyperlipidemia, MI, GERD just discharged from the hospital yesterday 6/13 following admission for NSTEMI secondary to Takotsubo cardiomyopathy.-cath with no significant CAD EF of 25% oral are present with above complaints. She reports that at about 11:30 today she developed a slurred speech and her family are noted that she had a right facial droop. She also states and that her gait was unsteady with some right-sided weakness.  A CT scan of her head was done and came back negative.  MRI revealed acute non hemorrhagic infarcts involving the medial aspect of the right thalamus and left occipital lobe.   Assessment / Plan / Recommendation Clinical Impression  Pt. presents with functional speech-language-cognitive abilities  (son present).  Pt. recalled tests that have been performed, outcomes of tests, medicine she is taking here and plan for rest of hospital stay.  Speech 100% intelligible.  Awareness, problem solving are intact.  No ST needed at this time.    SLP Assessment  Patient does not need any further Speech Lanaguage Pathology Services    Follow Up Recommendations  None    Frequency and Duration        Pertinent Vitals/Pain none       SLP Evaluation Prior Functioning  Cognitive/Linguistic Baseline: Within functional limits Type of Home: House Lives With: Son Available Help at Discharge: Family;Available 24 hours/day Vocation: Retired   Environmental health practitioner Comprehension Overall Auditory Comprehension: Appears within functional limits for tasks assessed Visual Recognition/Discrimination Discrimination: Not tested Reading Comprehension Reading Status: Within funtional limits    Expression Expression Primary Mode of Expression: Verbal Verbal Expression Overall Verbal Expression: Appears within functional limits for tasks assessed Written Expression Dominant Hand: Left Written Expression: Not tested   Oral / Motor Oral Motor/Sensory Function Overall Oral Motor/Sensory Function: Appears within functional limits for tasks assessed Motor Speech Overall Motor Speech: Appears within functional limits for tasks assessed Intelligibility: Intelligible Motor Planning: Witnin functional limits        [employer] M.Ed [employer] [phone]  01/17/2013

## 2013-01-17 NOTE — Progress Notes (Signed)
Bilateral carotid artery duplex:  Less than 39% ICA stenosis.  Vertebral artery flow is antegrade.     

## 2013-01-17 NOTE — Progress Notes (Signed)
Patient ID: Deanna Brown, female   DOB: 1946/07/05, 67 y.o.   MRN: [id number]   Awaiting echo with contrast today to evaluate for LV apical thrombus.  If this does not appear adequate to rule out thrombus, MRI would be more sensitive for LV thrombus than TEE.   Marca Ancona 01/17/2013 11:08 AM

## 2013-01-17 NOTE — Progress Notes (Signed)
PROGRESS NOTE  Deanna Brown:[id number] DOB: 1945/10/24 DOA: 01/15/2013 PCP: Geraldo Pitter, MD  Brief narrative: 67 yr old f admitted 6/14 after admission 6/9-6/14 with tachcycardia,-on Cardiac cath NSTEMI withTakotsubo CM noted, EF25%, Mod Pul Htn.  Started on that admission on Asa 81 mg, lipitor 10 daily.  Re-presented to [facility] 01/15/13 pm with Stroke like symptoms mainly slurred speech and R facial droop and R sided weakness.  She quickly returned to her usual baseline  Past medical history-As per Problem list Chart reviewed as below- H/o Esophageal stricture, s/p dilatation  Consultants:  Cardiology  Neurology  Procedures:  Ct head  Antibiotics:  none   Subjective   doing well.  Ambulant in room No deficit Son in room   Objective    Interim History: nad   Telemetry: nad  Objective: Filed Vitals:   01/17/13 0000 01/17/13 0400 01/17/13 0800 01/17/13 1204  BP: 127/74 144/94 151/81 107/71  Pulse: 80 84 86 87  Temp: 98.2 F (36.8 C) 98.2 F (36.8 C) 97.9 F (36.6 C) 98 F (36.7 C)  TempSrc: Oral Oral Oral Oral  Resp: 18 18 18 18   Height:      Weight:  51.3 kg (113 lb 1.5 oz)    SpO2: 98% 100% 99% 100%    Intake/Output Summary (Last 24 hours) at 01/17/13 1433 Last data filed at 01/17/13 0900  Gross per 24 hour  Intake   1100 ml  Output      0 ml  Net   1100 ml    Exam:  General: alert pleasant oriented, no facial assym Cardiovascular: s1 s2 no m/r/g Respiratory: clear Abdomen: soft Skinnad NeuroPower 5/5, sensory iontact, eflexes equivocal Cerebellar sings neg  Data Reviewed: Basic Metabolic Panel:  Recent Labs Lab 01/10/13 1857 01/11/13 0200  01/11/13 1635 01/12/13 0942 01/13/13 0420 01/15/13 1431  NA 131* 133*  --   --  132* 132* 134*  K 3.1* 2.8*  < >  --  3.1* 4.2 3.6  CL 93* 99  --   --  96 98 100  CO2 26 23  --   --  26 27  --   GLUCOSE 111* 142*  --   --  141* 100* 91  BUN 10 10  --   --  9 14 11   CREATININE 0.73  0.65  --  0.74 0.75 0.91 0.90  CALCIUM 9.9 8.8  --   --  8.7 8.4  --   MG  --   --   --   --  1.9  --   --   < > = values in this interval not displayed. Liver Function Tests:  Recent Labs Lab 01/10/13 1857 01/13/13 0420  AST 50* 21  ALT 15 9  ALKPHOS 68 58  BILITOT 0.6 0.9  PROT 8.6* 6.7  ALBUMIN 3.5 2.6*   No results found for this basename: LIPASE, AMYLASE,  in the last 168 hours No results found for this basename: AMMONIA,  in the last 168 hours CBC:  Recent Labs Lab 01/10/13 1857  01/11/13 1635 01/12/13 0452 01/13/13 0420 01/14/13 0330 01/15/13 1420 01/15/13 1431  WBC 12.2*  < > 11.3* 11.0* 9.6 7.3 5.4  --   NEUTROABS 8.8*  --   --   --   --   --   --   --   HGB 15.1*  < > 13.5 13.2 12.1 11.8* 13.4 13.3  HCT 42.0  < > 39.0 38.5 34.6* 33.9* 38.5  39.0  MCV 80.6  < > 82.1 82.6 81.6 82.5 82.4  --   PLT 393  < > 345 307 286 297 365  --   < > = values in this interval not displayed. Cardiac Enzymes:  Recent Labs Lab 01/10/13 2000 01/10/13 2336 01/11/13 0200 01/11/13 1102 01/15/13 1615  TROPONINI 13.69* 11.71* 10.66* 7.96* 1.15*   BNP: No components found with this basename: POCBNP,  CBG:  Recent Labs Lab 01/15/13 1419 01/15/13 1615  GLUCAP 88 69*    Recent Results (from the past 240 hour(s))  MRSA PCR SCREENING     Status: None   Collection Time    01/10/13 10:11 PM      Result Value Range Status   MRSA by PCR NEGATIVE  NEGATIVE Final   Comment:            The GeneXpert MRSA Assay (FDA     approved for NASAL specimens     only), is one component of a     comprehensive MRSA colonization     surveillance program. It is not     intended to diagnose MRSA     infection nor to guide or     monitor treatment for     MRSA infections.     Studies:              All Imaging reviewed and is as per above notation   Scheduled Meds: . atorvastatin  10 mg Oral q1800  . carvedilol  25 mg Oral BID WC  . clopidogrel  75 mg Oral Q breakfast  . enoxaparin  (LOVENOX) injection  40 mg Subcutaneous Q24H  . fluticasone  1 spray Each Nare Daily  . irbesartan  300 mg Oral Daily  . loratadine  10 mg Oral Daily  . mometasone-formoterol  2 puff Inhalation BID  . multivitamin with minerals  1 tablet Oral Daily  . pantoprazole  40 mg Oral Daily   Continuous Infusions:    Assessment/Plan: 1. Acute CVA Medial R thalamus/L occiput-Per neurology.  ? Cardiac thrombus 2/2 to Takotsubo-currently on Plavix 75 daily from asa. ECHo gorossly shows no thrombus.  Carotids r 40-59%.  Potentially needs [facility] MRI-await Neuro input 2. NSTEMI-Per cardiology-expect troponin's to trend down.  Appreciate input 3. Pulm htn-consider NItrates/Hydralazine 4. Htn-continue Coreg 25 bid, Irbesartan 300 daily 5. Hld-continue Atrovastatin 10 daily 6. Gerd-continue Pantoprazole 40 daily  Code Status: full Family Communication:  Brother + at bedside Disposition Plan:  inpatient   Pleas Koch, MD  [facility] Pager [phone] 01/17/2013, 2:33 PM    LOS: 2 days

## 2013-01-18 MED ORDER — ATORVASTATIN CALCIUM 10 MG PO TABS: 40.0000 mg | ORAL_TABLET | Freq: Every day | ORAL | Status: DC

## 2013-01-18 MED ORDER — CLOPIDOGREL BISULFATE 75 MG PO TABS: 75.0000 mg | ORAL_TABLET | Freq: Every day | ORAL | Status: DC

## 2013-01-18 NOTE — Progress Notes (Signed)
Discharge instructions given and reviewed with patient and family. Patient discharged home with family member.

## 2013-01-18 NOTE — Progress Notes (Signed)
Stroke Team Progress Note  HISTORY Deanna Brown is a 67 y.o. female discharge from [facility] yesterday 01/14/2013 following admission for an MI. The patient had returned home where she lives with her son. The patient's son stated that he left the house at approximately 8:00 am this morning and at that time his mother was fine. His mother later had a friend stop by who left at approximately 11:30 AM today. Again at that time the patient was fine. Around noon today family members stopped by and noted that the patient had dysarthria as well as a right facial droop and was having difficulty walking secondary to right-sided weakness. The patient was brought to the emergency department where a CT scan was performed that showed no acute abnormality. The patient's symptoms have since resolved and she feels back to baseline. Neurology was asked to see the patient for possible TIA. Patient was not a TPA candidate secondary to minimal deficit.  SUBJECTIVE Patient for discharge. Family at discharge.  OBJECTIVE Most recent Vital Signs: Filed Vitals:   01/17/13 2000 01/17/13 2039 01/18/13 0000 01/18/13 0400  BP: 96/63  123/75 151/84  Pulse: 83 82 78 58  Temp: 98.4 F (36.9 C)  98 F (36.7 C) 98.3 F (36.8 C)  TempSrc: Oral  Oral Oral  Resp: 18 18 18 18   Height:      Weight:      SpO2: 98% 98% 99% 99%   CBG (last 3)   Recent Labs  01/15/13 1419 01/15/13 1615  GLUCAP 88 69*    IV Fluid Intake:     MEDICATIONS  . atorvastatin  10 mg Oral q1800  . carvedilol  25 mg Oral BID WC  . clopidogrel  75 mg Oral Q breakfast  . enoxaparin (LOVENOX) injection  40 mg Subcutaneous Q24H  . fluticasone  1 spray Each Nare Daily  . irbesartan  300 mg Oral Daily  . loratadine  10 mg Oral Daily  . mometasone-formoterol  2 puff Inhalation BID  . multivitamin with minerals  1 tablet Oral Daily  . pantoprazole  40 mg Oral Daily   PRN:  acetaminophen, acetaminophen, senna-docusate  Diet:   Cardiac thin liquids Activity:   Up with assistance DVT Prophylaxis:  lovenox  CLINICALLY SIGNIFICANT STUDIES Basic Metabolic Panel:   Recent Labs Lab 01/12/13 0942 01/13/13 0420 01/15/13 1431  NA 132* 132* 134*  K 3.1* 4.2 3.6  CL 96 98 100  CO2 26 27  --   GLUCOSE 141* 100* 91  BUN 9 14 11   CREATININE 0.75 0.91 0.90  CALCIUM 8.7 8.4  --   MG 1.9  --   --    Liver Function Tests:   Recent Labs Lab 01/13/13 0420  AST 21  ALT 9  ALKPHOS 58  BILITOT 0.9  PROT 6.7  ALBUMIN 2.6*   CBC:   Recent Labs Lab 01/14/13 0330 01/15/13 1420 01/15/13 1431  WBC 7.3 5.4  --   HGB 11.8* 13.4 13.3  HCT 33.9* 38.5 39.0  MCV 82.5 82.4  --   PLT 297 365  --    Coagulation:   Recent Labs Lab 01/15/13 1615  LABPROT 13.0  INR 0.99   Cardiac Enzymes:   Recent Labs Lab 01/11/13 1102 01/15/13 1615  TROPONINI 7.96* 1.15*   Urinalysis:   Recent Labs Lab 01/12/13 1534 01/15/13 1627  COLORURINE YELLOW YELLOW  LABSPEC 1.008 1.009  PHURINE 7.5 7.0  GLUCOSEU NEGATIVE NEGATIVE  HGBUR NEGATIVE NEGATIVE  BILIRUBINUR  NEGATIVE NEGATIVE  KETONESUR NEGATIVE NEGATIVE  PROTEINUR NEGATIVE NEGATIVE  UROBILINOGEN 0.2 1.0  NITRITE NEGATIVE NEGATIVE  LEUKOCYTESUR TRACE* NEGATIVE   Lipid Panel    Component Value Date/Time   CHOL 127 01/16/2013 0420   TRIG 94 01/16/2013 0420   HDL 30* 01/16/2013 0420   CHOLHDL 4.2 01/16/2013 0420   VLDL 19 01/16/2013 0420   LDLCALC 78 01/16/2013 0420   HgbA1C  Lab Results  Component Value Date   HGBA1C 5.7* 01/16/2013    Urine Drug Screen:     Component Value Date/Time   LABOPIA NONE DETECTED 01/15/2013 1627   COCAINSCRNUR NONE DETECTED 01/15/2013 1627   LABBENZ NONE DETECTED 01/15/2013 1627   AMPHETMU NONE DETECTED 01/15/2013 1627   THCU NONE DETECTED 01/15/2013 1627   LABBARB NONE DETECTED 01/15/2013 1627    Alcohol Level:   Recent Labs Lab 01/15/13 1615  ETH <11    CT of the brain   No acute intracranial abnormality. Mild cerebral  atrophy. Mucosal thickening bilateral maxillary sinus.  MRI of the brain  01/16/2013   Acute non hemorrhagic infarcts involving the medial aspect of the right thalamus and left occipital lobe.  No intracranial hemorrhage.  Mild to slightly moderate small vessel disease type changes.  Paranasal sinus mucosal thickening / opacification with air fluid level right maxillary sinus which may reflect changes of acute sinusitis.   MRA of the brain  01/16/2013   Exam is motion degraded and evaluation limited with findings suggesting branch vessel atherosclerotic type changes  2D Echocardiogram   - Left ventricle: There is motion at the base. There is severe hypokinesis in all of the other segments. The motion is c/w Takotsubo. The cavity size was normal. Wall thickness was increased in a pattern of moderate LVH. Systolic function was severely reduced. The estimated ejection fraction was in the range of 25% to 30%. - Right ventricle: There is mild/moderate RV dysfunction, but the RV is not dilated.  Carotid Doppler  No evidence of hemodynamically significant internal carotid artery stenosis. Vertebral artery flow is antegrade.   CXR   1. Mild cardiomegaly and mild pulmonary vascular congestion.  2. Interval small right pleural effusion.  3. Stable small left pleural effusion or thickening.  4. Resolved atelectasis at the lung bases.  5. Stable changes of COPD and chronic bronchitis  EKG   Normal sinus rhythm Possible Anterior infarct , age undetermined T wave abnormality, consider lateral ischemia  Therapy Recommendations No PT OR OT needed as OP  Physical Exam  General: The patient is alert and cooperative at the time of the examination.  Skin: No significant peripheral edema is noted.   Neurologic Exam  Cranial nerves: Facial symmetry is present. Speech is normal, no aphasia or dysarthria is noted. Extraocular movements are full. Visual fields are full.  Motor: The patient has good  strength in all 4 extremities.  Coordination: The patient has good finger-nose-finger and heel-to-shin bilaterally.  Gait and station: The gait was not tested. No drift is seen.  Reflexes: Deep tendon reflexes are symmetric.    ASSESSMENT Ms. Deanna Brown is a 67 y.o. female presenting with transient slurred speech and right-sided weakness. The patient was just discharged with a myocardial infarction. MRI confirms acute non hemorrhagic infarcts medial aspect of the right thalamus and left occipital lobe. Infarcts felt to be embolic secondary to recent MI. The patient was  on aspirin prior to admission.   HTN  Dyslipidemia. On lipitor  Recent MI  GERD  Clinical TIA left brain  EF 25% by 2 D echo, CM  Pulmonary HTN  Dr. Anne Hahn spoke with Dr. Mahala Menghini . Cardiology felt 2D was adequate and TEE not indicated.  Hospital day # 3  TREATMENT/PLAN  Plavix therapy 75 mg for secondary stroke prevention  Continue lipitor at discharge  No therapy needs No further stroke workup indicated. Patient has a 10-15% risk of having another stroke over the next year, the highest risk is within 2 weeks of the most recent stroke/TIA (risk of having a stroke following a stroke or TIA is the same). Ongoing risk factor control by Primary Care Physician Stroke Service will sign off. Please call should any needs arise. Follow up with  Stroke Clinic, in 2 months.  Annie Main, MSN, RN, ANVP-BC, ANP-BC, [facility] Pager: [phone] 01/18/2013 9:11 AM  I have personally obtained a history, examined the patient, evaluated imaging results, and formulated the assessment and plan of care. I agree with the above. Lesly Dukes

## 2013-01-18 NOTE — Discharge Summary (Signed)
Physician Discharge Summary  Junetta Hearn Brown ZOX:[id number] DOB: [birth year] DOA: 01/15/2013  PCP: Geraldo Pitter, MD  Admit date: 01/15/2013 Discharge date: 01/18/2013  Time spent: 35 minutes  Recommendations for Outpatient Follow-up:  1. Her Lipitor dosing has changed from 10-40 mg for high intensity prophylaxis of CVA 2. Her aspirin has been changed in favor of Plavix 75 mg daily  Discharge Diagnoses:  Principal Problem:   TIA (transient ischemic attack) Active Problems:   Takotsubo cardiomyopathy   GERD (gastroesophageal reflux disease)   HTN (hypertension), benign   Discharge Condition: Good  Diet recommendation: Heart healthy low-salt  Filed Weights   01/15/13 1811 01/16/13 0400 01/17/13 0400  Weight: 49.7 kg (109 lb 9.1 oz) 50.8 kg (111 lb 15.9 oz) 51.3 kg (113 lb 1.5 oz)    History of present illness:  67 yr old f admitted 6/14 after admission 6/9-6/14 with tachcycardia,-on Cardiac cath NSTEMI withTakotsubo CM noted, EF25%, Mod Pul Htn. Started on that admission on Asa 81 mg, lipitor 10 daily. Re-presented to [facility] 01/15/13 pm with Stroke like symptoms mainly slurred speech and R facial droop and R sided weakness. She quickly returned to her usual baseline.  Workup ultimately showed thalami CVAs which were not present before. She ultimately underwent further workup with repeat echocardiogram carotids negative see below   Hospital Course:   1. Acute CVA Medial R thalamus/L occiput- ? Cardiac thrombus 2/2 to Takotsubo-currently on Plavix 75 daily from asa. ECHO shows no thrombus. Carotids r 40-59%. Patient was discharged home with secondary prophylaxis and instructed to followup with primary care physician 2. NSTEMI-history of Takotsubo CM hosp 6/9--6/13 troponins were elevated but continued to  trend down. Appreciate input of cardiology-a repeat echo on 6/16 showed an EF improved to 40-45% 3. Pulm htn-consider NItrates/Hydralazine-this decision can be made as an out  patient 4. Htn-continue Coreg 25 bid, Irbesartan 300 daily 5. Hld-increased atorvastatin from 10-40 mg daily, she can finish her regular tablets and then get new prescription care physician's office 6. Gerd-continue Pantoprazole 40 daily    Discharge Exam: Filed Vitals:   01/17/13 2000 01/17/13 2039 01/18/13 0000 01/18/13 0400  BP: 96/63  123/75 151/84  Pulse: 83 82 78 58  Temp: 98.4 F (36.9 C)  98 F (36.7 C) 98.3 F (36.8 C)  TempSrc: Oral  Oral Oral  Resp: 18 18 18 18   Height:      Weight:      SpO2: 98% 98% 99% 99%    General: Alert pleasant oriented Cardiovascular: S1-S2 no murmur rub or Respiratory: Clinically clear  Discharge Instructions  Discharge Orders   Future Appointments Provider Department Dept Phone   01/19/2013 3:40 PM Beatrice Lecher, PA-C [facility] Main [facility] [place]) [phone]   Future Orders Complete By Expires     Diet - low sodium heart healthy  As directed     Discharge instructions  As directed     Comments:      Should discontinue use of aspirin in favor of Plavix Increased your dose of Lipitor Followup with cardiology as scheduled Followup with primary care physician as scheduled There is no indication that you have asthma, we have discontinued your inhaler    Increase activity slowly  As directed         Medication List    STOP taking these medications       aspirin 81 MG EC tablet     Fluticasone-Salmeterol 100-50 MCG/DOSE Aepb  Commonly known as:  ADVAIR  TAKE these medications       alendronate 70 MG tablet  Commonly known as:  FOSAMAX  Take 70 mg by mouth every 7 (seven) days. Take with a full glass of water on an empty stomach.     atorvastatin 10 MG tablet  Commonly known as:  LIPITOR  Take 4 tablets (40 mg total) by mouth daily at 6 PM.     carvedilol 25 MG tablet  Commonly known as:  COREG  Take 1 tablet (25 mg total) by mouth 2 (two) times daily with a meal.     clopidogrel 75 MG tablet  Commonly  known as:  PLAVIX  Take 1 tablet (75 mg total) by mouth daily with breakfast.     fluticasone 27.5 MCG/SPRAY nasal spray  Commonly known as:  VERAMYST  Place 2 sprays into the nose daily.     irbesartan 300 MG tablet  Commonly known as:  AVAPRO  Take 1 tablet (300 mg total) by mouth daily.     loratadine 10 MG tablet  Commonly known as:  CLARITIN  Take 10 mg by mouth daily.     multivitamin-iron-minerals-folic acid chewable tablet  Chew 1 tablet by mouth daily.     pantoprazole 40 MG tablet  Commonly known as:  PROTONIX  Take 40 mg by mouth daily.       Allergies  Allergen Reactions  . Cetirizine Hcl Itching      The results of significant diagnostics from this hospitalization (including imaging, microbiology, ancillary and laboratory) are listed below for reference.    Significant Diagnostic Studies: Dg Chest 2 View  01/15/2013   *RADIOLOGY REPORT*  Clinical Data: Slurred speech.  Loss of mobility.  CHEST - 2 VIEW  Comparison: 01/12/2013.  Findings: Stable mildly enlarged cardiac silhouette and hyperexpansion of the lungs.  Stable mild diffuse peribronchial thickening.  Interval small right pleural effusion.  Stable small amount of left pleural thickening or fluid.  The upper lung zone pulmonary vasculature remains prominent.  Improved aeration at the lung bases.  Diffuse osteopenia.  IMPRESSION:  1.  Mild cardiomegaly and mild pulmonary vascular congestion. 2.  Interval small right pleural effusion. 3.  Stable small left pleural effusion or thickening. 4.  Resolved atelectasis at the lung bases. 5.  Stable changes of COPD and chronic bronchitis.   Original Report Authenticated By: Beckie Salts, M.D.   Dg Chest 2 View  01/10/2013   *RADIOLOGY REPORT*  Clinical Data: Tachycardia.  Positive troponin.  History of hypertension.  CHEST - 2 VIEW  Comparison: 08/12/2012  Findings: Mild cardiac enlargement with mild pulmonary vascular congestion, developing since previous study.  No  significant edema. Focal area of infiltration in the left upper lung may represent superimposed pneumonia or vascular crowding.  Underlying diffuse emphysematous changes.  Small bilateral pleural effusions.  IMPRESSION: Cardiac enlargement with pulmonary vascular congestion and small pleural effusions.  Superimposed infiltration in the left upper lung.  Underlying emphysematous changes.   Original Report Authenticated By: Burman Nieves, M.D.   Ct Head Wo Contrast  01/15/2013   *RADIOLOGY REPORT*  Clinical Data: TIA  CT HEAD WITHOUT CONTRAST  Technique:  Contiguous axial images were obtained from the base of the skull through the vertex without contrast.  Comparison: None.  Findings: No skull fracture is noted.  No intracranial hemorrhage, mass effect or midline shift.  Minimal cerebral atrophy.  Mild periventricular white matter decreased attenuation probable due to chronic small vessel ischemic changes.  No acute cortical infarction.  No mass lesion is noted on this unenhanced scan. There is mucosal thickening with air-fluid level bilateral maxillary sinus.  IMPRESSION: No acute intracranial abnormality.  Mild cerebral atrophy. Mucosal thickening bilateral maxillary sinus.   Original Report Authenticated By: Natasha Mead, M.D.   Mr Brain Wo Contrast  01/16/2013   *RADIOLOGY REPORT*  Clinical Data:  Hypertensive patient with history of hypercholesterolemia and cardiomyopathy presenting with dysarthria and right facial droop with right-sided weakness.  MRI BRAIN WITHOUT CONTRAST MRA HEAD WITHOUT CONTRAST  Technique: Multiplanar, multiecho pulse sequences of the brain and surrounding structures were obtained according to standard protocol without intravenous contrast.  Angiographic images of the head were obtained using MRA technique without contrast.  Comparison: 01/15/2013 CT.  No comparison MR.  MRI HEAD  Findings:  Acute non hemorrhagic infarcts involving the medial aspect of the right thalamus and left  occipital lobe.  No intracranial hemorrhage.  Mild to slightly moderate small vessel disease type changes.  No hydrocephalus.  No intracranial mass lesion detected on this unenhanced exam.  Slightly heterogeneous appearance bone marrow without focal mass identified may reflect changes of the patient's mild anemia.  Cervical medullary junction, pituitary region, pineal region and orbital structures unremarkable.  Paranasal sinus mucosal thickening / opacification with air fluid level right maxillary sinus which may reflect changes of acute sinusitis.  IMPRESSION: Acute non hemorrhagic infarcts involving the medial aspect of the right thalamus and left occipital lobe.  No intracranial hemorrhage.  Mild to slightly moderate small vessel disease type changes.  Paranasal sinus mucosal thickening / opacification with air fluid level right maxillary sinus which may reflect changes of acute sinusitis.  MRA HEAD  Findings: Motion degraded exam.  Ectatic cavernous segment and supraclinoid segment of the internal carotid artery bilaterally with regions of mild and  irregularity narrowing.  Moderate narrowing involving portions of the A1 segment of the anterior cerebral artery bilaterally more notable on the right.  Mild to moderate narrowing distal M1 segment right middle cerebral artery.  Middle cerebral artery moderate branch vessel irregularity and narrowing.  Right vertebral artery is dominant.  Narrowing and mild irregularity of the distal vertebral arteries without high-grade stenosis.  Mild irregularity and narrowing basilar artery.  No high-grade stenosis.  Nonvisualization AICAs.  Mild to moderate narrowing proximal P1 segment right posterior cerebral artery.  Mild irregularity posterior cerebral artery distal branches.  Narrowing irregularity superior cerebellar arteries.  No obvious aneurysm.  IMPRESSION: Exam is motion degraded and evaluation limited with findings suggesting branch vessel atherosclerotic type  changes as detailed above.  Critical Value/emergent results were called by telephone at the time of interpretation on 01/16/2013 at 3:15 p.m. to Vernona Rieger the patient's nurse, who verbally acknowledged these results.   Original Report Authenticated By: Lacy Duverney, M.D.   Dg Chest Port 1 View  01/12/2013   *RADIOLOGY REPORT*  Clinical Data: Hypoxia.  Tachycardia.  PORTABLE CHEST - 1 VIEW  Comparison: 01/10/2013  Findings: Heart size is normal.  There is no pleural effusion or edema.  There is asymmetric decreased aeration to the left base.  The visualized osseous structures appear intact.  IMPRESSION:  1. Diminished aeration to the left base which may represent atelectasis or early consolidation.   Original Report Authenticated By: Signa Kell, M.D.   Mr Mra Head/brain Wo Cm  01/16/2013   *RADIOLOGY REPORT*  Clinical Data:  Hypertensive patient with history of hypercholesterolemia and cardiomyopathy presenting with dysarthria and right facial droop with right-sided weakness.  MRI BRAIN WITHOUT CONTRAST MRA  HEAD WITHOUT CONTRAST  Technique: Multiplanar, multiecho pulse sequences of the brain and surrounding structures were obtained according to standard protocol without intravenous contrast.  Angiographic images of the head were obtained using MRA technique without contrast.  Comparison: 01/15/2013 CT.  No comparison MR.  MRI HEAD  Findings:  Acute non hemorrhagic infarcts involving the medial aspect of the right thalamus and left occipital lobe.  No intracranial hemorrhage.  Mild to slightly moderate small vessel disease type changes.  No hydrocephalus.  No intracranial mass lesion detected on this unenhanced exam.  Slightly heterogeneous appearance bone marrow without focal mass identified may reflect changes of the patient's mild anemia.  Cervical medullary junction, pituitary region, pineal region and orbital structures unremarkable.  Paranasal sinus mucosal thickening / opacification with air fluid level  right maxillary sinus which may reflect changes of acute sinusitis.  IMPRESSION: Acute non hemorrhagic infarcts involving the medial aspect of the right thalamus and left occipital lobe.  No intracranial hemorrhage.  Mild to slightly moderate small vessel disease type changes.  Paranasal sinus mucosal thickening / opacification with air fluid level right maxillary sinus which may reflect changes of acute sinusitis.  MRA HEAD  Findings: Motion degraded exam.  Ectatic cavernous segment and supraclinoid segment of the internal carotid artery bilaterally with regions of mild and  irregularity narrowing.  Moderate narrowing involving portions of the A1 segment of the anterior cerebral artery bilaterally more notable on the right.  Mild to moderate narrowing distal M1 segment right middle cerebral artery.  Middle cerebral artery moderate branch vessel irregularity and narrowing.  Right vertebral artery is dominant.  Narrowing and mild irregularity of the distal vertebral arteries without high-grade stenosis.  Mild irregularity and narrowing basilar artery.  No high-grade stenosis.  Nonvisualization AICAs.  Mild to moderate narrowing proximal P1 segment right posterior cerebral artery.  Mild irregularity posterior cerebral artery distal branches.  Narrowing irregularity superior cerebellar arteries.  No obvious aneurysm.  IMPRESSION: Exam is motion degraded and evaluation limited with findings suggesting branch vessel atherosclerotic type changes as detailed above.  Critical Value/emergent results were called by telephone at the time of interpretation on 01/16/2013 at 3:15 p.m. to Vernona Rieger the patient's nurse, who verbally acknowledged these results.   Original Report Authenticated By: Lacy Duverney, M.D.    Microbiology: Recent Results (from the past 240 hour(s))  MRSA PCR SCREENING     Status: None   Collection Time    01/10/13 10:11 PM      Result Value Range Status   MRSA by PCR NEGATIVE  NEGATIVE Final    Comment:            The GeneXpert MRSA Assay (FDA     approved for NASAL specimens     only), is one component of a     comprehensive MRSA colonization     surveillance program. It is not     intended to diagnose MRSA     infection nor to guide or     monitor treatment for     MRSA infections.     Labs: Basic Metabolic Panel:  Recent Labs Lab 01/11/13 1102 01/11/13 1635 01/12/13 0942 01/13/13 0420 01/15/13 1431  NA  --   --  132* 132* 134*  K 3.6  --  3.1* 4.2 3.6  CL  --   --  96 98 100  CO2  --   --  26 27  --   GLUCOSE  --   --  141* 100* 91  BUN  --   --  9 14 11   CREATININE  --  0.74 0.75 0.91 0.90  CALCIUM  --   --  8.7 8.4  --   MG  --   --  1.9  --   --    Liver Function Tests:  Recent Labs Lab 01/13/13 0420  AST 21  ALT 9  ALKPHOS 58  BILITOT 0.9  PROT 6.7  ALBUMIN 2.6*   No results found for this basename: LIPASE, AMYLASE,  in the last 168 hours No results found for this basename: AMMONIA,  in the last 168 hours CBC:  Recent Labs Lab 01/11/13 1635 01/12/13 0452 01/13/13 0420 01/14/13 0330 01/15/13 1420 01/15/13 1431  WBC 11.3* 11.0* 9.6 7.3 5.4  --   HGB 13.5 13.2 12.1 11.8* 13.4 13.3  HCT 39.0 38.5 34.6* 33.9* 38.5 39.0  MCV 82.1 82.6 81.6 82.5 82.4  --   PLT 345 307 286 297 365  --    Cardiac Enzymes:  Recent Labs Lab 01/11/13 1102 01/15/13 1615  TROPONINI 7.96* 1.15*   BNP: BNP (last 3 results)  Recent Labs  01/11/13 0200 01/12/13 0926  PROBNP 13925.0* 33098.0*   CBG:  Recent Labs Lab 01/15/13 1419 01/15/13 1615  GLUCAP 88 69*       Signed:  Jedd Schulenburg, JAI-GURMUKH  [facility] 01/18/2013, 7:43 AM

## 2013-01-19 ENCOUNTER — Encounter: Payer: Self-pay | Admitting: Physician Assistant

## 2013-01-19 ENCOUNTER — Ambulatory Visit (INDEPENDENT_AMBULATORY_CARE_PROVIDER_SITE_OTHER): Payer: Medicare Other | Admitting: Physician Assistant

## 2013-01-19 VITALS — BP 108/63 | HR 70 | Ht 61.0 in | Wt 110.0 lb

## 2013-01-19 DIAGNOSIS — G459 Transient cerebral ischemic attack, unspecified: Secondary | ICD-10-CM

## 2013-01-19 DIAGNOSIS — I5181 Takotsubo syndrome: Secondary | ICD-10-CM

## 2013-01-19 DIAGNOSIS — I10 Essential (primary) hypertension: Secondary | ICD-10-CM

## 2013-01-19 DIAGNOSIS — E785 Hyperlipidemia, unspecified: Secondary | ICD-10-CM

## 2013-01-19 NOTE — Progress Notes (Signed)
[address] [place], [place]  [address] Phone: [phone] Fax:  [phone]  Date:  01/19/2013   ID:  PADEN KURAS, DOB 1945-09-05, MRN [id number]  PCP:  Geraldo Pitter, MD  Cardiologist:  Dr. Zackery Barefoot     History of Present Illness: Deanna Brown is a 67 y.o. female who returns for follow up after 2 recent admissions to the hospital.  She has a hx of HTN, HL, GERD, uterine CA and asthma.  She was admitted 6/9-6/13 with a non-STEMI. She presented with complaints of palpitations. Troponins were markedly elevated. She had no chest pain or shortness of breath. LHC 6/14:  proximal RCA 20%.  There was mod pulmo HTN and the EF was 25% with LV wall motion abnormalities consistent with Tako-Tsubo CM.  Echo 01/12/13: WMA c/w Tako-Tsubo CM, mod LVH, EF 25-30%, mild to mod RV dysfn, PASP 37.  She exhibited signs of sinus tachycardia and she was treated with beta blocker.  She was readmitted 6/14-6/17.  She presented with slurred speech and right facial droop as well as right-sided weakness.  Her symptoms resolved. This was felt to represent a left brain TIA.  Head CT demonstrated no acute abnormality.  MRI demonstrated acute nonhemorrhagic infarcts involving the medial aspect of the right thalamus and left occipital lobe.  Echo 01/17/13: Moderate LVH, EF 45-50%, diffuse HK, PASP 37.  Carotid Dopplers 6/14: RICA 40-59% (possible increased velocities due to tortuosity).  She was seen by cardiology due to the concern of cardioembolic source.  According to the notes, case was reviewed with cardiology and echo was felt to not demonstrate an LV thrombus and no further testing was indicated.  She was placed on Plavix in lieu of ASA for secondary stroke prevention (followed by the stroke service).  She was just d/c yesterday.  Since d/c, she is feeling well.  No further facial droop or weakness.  No chest pain, dyspnea, orthopnea, PND, edema.  No syncope.    Labs (6/14):  K 3.6, Cr 0.9,  ALT 9, LDL 78, TSH 0.764  Wt Readings from Last 3 Encounters:  01/17/13 113 lb 1.5 oz (51.3 kg)  01/14/13 104 lb 4.8 oz (47.31 kg)  01/14/13 104 lb 4.8 oz (47.31 kg)     Past Medical History  Diagnosis Date  . Hypertension   . Asthma   . Seasonal allergies   . High cholesterol   . GERD (gastroesophageal reflux disease)   . Uterine cancer   . Takotsubo cardiomyopathy     a. NSTEMI 2/2 takotsubo 01/2013, no significant CAD by cath, EF 25%;  b. 01/2013 Echo: EF 25-30%, mod LVH, Sev HK in all segments except base. Mild/mod RV dysfxn, PASP .;  c.  Echo 01/17/13: Moderate LVH, EF 45-50%, diffuse HK, PASP 37  . Pulmonary HTN     a. Mod pulm HTN by cath 01/2013 (PASP ).  . PVC's (premature ventricular contractions)     a. Noted during 01/2013 admission.    Current Outpatient Prescriptions  Medication Sig Dispense Refill  . alendronate (FOSAMAX) 70 MG tablet Take 70 mg by mouth every 7 (seven) days. Take with a full glass of water on an empty stomach.      Marland Kitchen atorvastatin (LIPITOR) 10 MG tablet Take 4 tablets (40 mg total) by mouth daily at 6 PM.      . carvedilol (COREG) 25 MG tablet Take 1 tablet (25 mg total) by mouth 2 (two) times daily with a meal.  60 tablet  3  . clopidogrel (PLAVIX) 75 MG tablet Take 1 tablet (75 mg total) by mouth daily with breakfast.  30 tablet  0  . fluticasone (VERAMYST) 27.5 MCG/SPRAY nasal spray Place 2 sprays into the nose daily.        . irbesartan (AVAPRO) 300 MG tablet Take 1 tablet (300 mg total) by mouth daily.  30 tablet  3  . loratadine (CLARITIN) 10 MG tablet Take 10 mg by mouth daily.        . multivitamin-iron-minerals-folic acid (CENTRUM) chewable tablet Chew 1 tablet by mouth daily.        . pantoprazole (PROTONIX) 40 MG tablet Take 40 mg by mouth daily.       No current facility-administered medications for this visit.    Allergies:    Allergies  Allergen Reactions  . Cetirizine Hcl Itching    Social History:  The patient   reports that she has never smoked. She has never used smokeless tobacco. She reports that she does not drink alcohol or use illicit drugs.   ROS:  Please see the history of present illness.      All other systems reviewed and negative.   PHYSICAL EXAM: VS:  BP 108/63  Pulse 70  Ht 5\' 1"  (1.549 m)  Wt 110 lb (49.896 kg)  BMI 20.8 kg/m2 Well nourished, well developed, in no acute distress HEENT: normal Neck: no JVD Cardiac:  normal S1, S2; RRR; no murmur Lungs:  clear to auscultation bilaterally, no wheezing, rhonchi or rales Abd: soft, nontender, no hepatomegaly Ext: no edema; right groin without hematoma or bruit  Skin: warm and dry Neuro:  CNs 2-12 intact, no focal abnormalities noted  EKG:  NSR, HR 70, inf-lat TWI     ASSESSMENT AND PLAN:  1. Tako-Tsubo CM:  Patient has had partial recovery in LVF by most recent echo.  Continue ARB and beta blocker.  We discussed the expected course. 2. Left Brain TIA/CVA:  MRI indicates embolic CVA.  She was followed by neurology.  No LV thrombus noted on echo.  She was felt to only need Plavix.  She was not felt to need any further testing.  F/u with neurology as planned. 3. Hyperlipidemia:  Check Lipids and LFTs in 6 weeks.   4. Hypertension:  Controlled.  Continue current therapy.  5. Disposition:  F/u with me or Dr. Zackery Barefoot in 6-8 weeks.   Signed, Tereso Newcomer, PA-C  01/19/2013 3:45 PM

## 2013-01-19 NOTE — Patient Instructions (Addendum)
YOU WILL NEED FASTING LIPID AND LIVER PANEL IN 6-8 WEEKS  PLEASE FOLLOW UP WITH DR. KATZ IN 6-8 WEEKS  NO MEDICATION CHANGES WERE MADE TODAY

## 2013-01-25 ENCOUNTER — Other Ambulatory Visit: Payer: Self-pay

## 2013-01-25 ENCOUNTER — Other Ambulatory Visit: Payer: Self-pay | Admitting: *Deleted

## 2013-01-25 MED ORDER — ATORVASTATIN CALCIUM 40 MG PO TABS: 40.0000 mg | ORAL_TABLET | Freq: Every day | ORAL | Status: DC

## 2013-01-25 MED ORDER — ATORVASTATIN CALCIUM 10 MG PO TABS: 40.0000 mg | ORAL_TABLET | Freq: Every day | ORAL | Status: DC

## 2013-02-16 ENCOUNTER — Telehealth: Payer: Self-pay | Admitting: *Deleted

## 2013-02-16 ENCOUNTER — Other Ambulatory Visit: Payer: Self-pay | Admitting: *Deleted

## 2013-02-16 NOTE — Telephone Encounter (Signed)
Will forward to Deanna Brown, [place] since Dr Myrtis Ser is not here and he saw her recently

## 2013-02-16 NOTE — Telephone Encounter (Signed)
I agree with stopping Plavix and changing to aspirin 325 mg daily

## 2013-02-16 NOTE — Telephone Encounter (Signed)
Possible allergic reaction to Plavix. Neurology followed her in the hospital for her stroke and recommended Plavix instead of ASA for secondary stroke prevention.  I would have her stop the Plavix. Start back on full dose ASA 325 mg QD. She can take Benadryl 25 mg Q 6 hours prn itching. I think she needs input from neurology as well.  I'm not sure if they would recommend Aggrenox or some other antiplatelet. Please have patient call [facility] for advice on this problem as well. I believe the d/c summary stated she was to see Dr. Pearlean Brownie in follow up in a couple months.  I will also route this phone note to Dr. Pearlean Brownie. Tereso Newcomer, PA-C   02/16/2013 4:29 PM

## 2013-02-16 NOTE — Telephone Encounter (Signed)
Per pt call - states she noticed a rash on Thursday of last week.  The rash is raised, very itchy and on her back arms and ankles.  PCP gave her an RX for meclizine which per pt is not helping.  Pt was started on Plavix in the hospital d/t CVA according to documentation.  Will forward to MD for review and further recommendations

## 2013-02-16 NOTE — Telephone Encounter (Signed)
Have attempt several times to contact pt by phone - sounds as if someone picks up but then the phone beeps and after a several seconds it disconnects.  Work number is a a [employer] and I did not leave a message on it.  Will need to continue to attempt to contact pt.

## 2013-02-17 MED ORDER — ASPIRIN EC 325 MG PO TBEC: 325.0000 mg | DELAYED_RELEASE_TABLET | Freq: Every day | ORAL | Status: DC

## 2013-02-17 NOTE — Telephone Encounter (Signed)
Spoke with patient to inform her of orders to stop Plavix and take ASA 325 mg QD and to take Benadryl 25 mg Q 6 hours prn itching.  Patient states she continues to experience itching but has not taken any Benadryl.  Patient states Meclizine is not helping.  Patient verbalized understanding and is aware of upcoming appointments with Dr. Myrtis Ser and Dr. Pearlean Brownie.  Patient verbalized understanding to call us with questions or concerns.  Patient expressed gratitude for our help.

## 2013-02-21 ENCOUNTER — Other Ambulatory Visit (HOSPITAL_COMMUNITY): Payer: Self-pay | Admitting: Physician Assistant

## 2013-02-26 ENCOUNTER — Encounter (HOSPITAL_COMMUNITY): Payer: Self-pay | Admitting: Emergency Medicine

## 2013-02-26 ENCOUNTER — Emergency Department (HOSPITAL_COMMUNITY)
Admission: EM | Admit: 2013-02-26 | Discharge: 2013-02-26 | Disposition: A | Discharge status: Home / Self Care | Payer: Medicare Other | Attending: Emergency Medicine | Admitting: Emergency Medicine

## 2013-02-26 DIAGNOSIS — Z7982 Long term (current) use of aspirin: Secondary | ICD-10-CM | POA: Insufficient documentation

## 2013-02-26 DIAGNOSIS — Z8709 Personal history of other diseases of the respiratory system: Secondary | ICD-10-CM | POA: Insufficient documentation

## 2013-02-26 DIAGNOSIS — Z8679 Personal history of other diseases of the circulatory system: Secondary | ICD-10-CM | POA: Insufficient documentation

## 2013-02-26 DIAGNOSIS — I252 Old myocardial infarction: Secondary | ICD-10-CM | POA: Insufficient documentation

## 2013-02-26 DIAGNOSIS — Z79899 Other long term (current) drug therapy: Secondary | ICD-10-CM | POA: Insufficient documentation

## 2013-02-26 DIAGNOSIS — Z8542 Personal history of malignant neoplasm of other parts of uterus: Secondary | ICD-10-CM | POA: Insufficient documentation

## 2013-02-26 DIAGNOSIS — E7800 Pure hypercholesterolemia, unspecified: Secondary | ICD-10-CM | POA: Insufficient documentation

## 2013-02-26 DIAGNOSIS — J45909 Unspecified asthma, uncomplicated: Secondary | ICD-10-CM | POA: Insufficient documentation

## 2013-02-26 DIAGNOSIS — K219 Gastro-esophageal reflux disease without esophagitis: Secondary | ICD-10-CM | POA: Insufficient documentation

## 2013-02-26 DIAGNOSIS — I10 Essential (primary) hypertension: Secondary | ICD-10-CM

## 2013-02-26 NOTE — ED Provider Notes (Signed)
CSN: [id number]     Arrival date & time 02/26/13  1925 History     First MD Initiated Contact with Patient 02/26/13 2052     Chief Complaint  Patient presents with  . Hypertension   (Consider location/radiation/quality/duration/timing/severity/associated sxs/prior Treatment) HPI Comments: Pt is on medications for HTN, has had heart attack and stroke in the past by her history and admits she is worried about HTN and monitors it closely, actually takes BP 4 times daily. She had her uncle's viewing earlier this afternoon.  A few hours later, she took BP and it was 170/110, then a couple of hours later, actually got to 200/115, and she and family thought she be checked in the ED.  Pt has not skipped any doses of her BP meds.  She did admit that got a little upset, appropriately so with family meeting at uncles viewing.  But no weakness, slurred speech, headache, blurred vision, SOB, CP, nausea.    Patient is a 67 y.o. female presenting with hypertension. The history is provided by the patient and a relative.  Hypertension This is a chronic problem. Pertinent negatives include no chest pain, no abdominal pain, no headaches and no shortness of breath.    Past Medical History  Diagnosis Date  . Hypertension   . Asthma   . Seasonal allergies   . High cholesterol   . GERD (gastroesophageal reflux disease)   . Uterine cancer   . Takotsubo cardiomyopathy     a. NSTEMI 2/2 takotsubo 01/2013, no significant CAD by cath, EF 25%;  b. 01/2013 Echo: EF 25-30%, mod LVH, Sev HK in all segments except base. Mild/mod RV dysfxn, PASP .;  c.  Echo 01/17/13: Moderate LVH, EF 45-50%, diffuse HK, PASP 37  . Pulmonary HTN     a. Mod pulm HTN by cath 01/2013 (PASP ).  . PVC's (premature ventricular contractions)     a. Noted during 01/2013 admission.   Past Surgical History  Procedure Laterality Date  . Abdominal hysterectomy  1999   History reviewed. No pertinent family history. History  Substance  Use Topics  . Smoking status: Never Smoker   . Smokeless tobacco: Never Used  . Alcohol Use: No   OB History   Grav Para Term Preterm Abortions TAB SAB Ect Mult Living                 Review of Systems  Eyes: Negative for visual disturbance.  Respiratory: Negative for chest tightness and shortness of breath.   Cardiovascular: Negative for chest pain.  Gastrointestinal: Negative for nausea, vomiting and abdominal pain.  Musculoskeletal: Negative for back pain.  Neurological: Negative for dizziness, syncope, light-headedness and headaches.    Allergies  Cetirizine hcl; Plavix; and Xyzal  Home Medications   Current Outpatient Rx  Name  Route  Sig  Dispense  Refill  . alendronate (FOSAMAX) 70 MG tablet   Oral   Take 70 mg by mouth every 7 (seven) days. Take with a full glass of water on an empty stomach.         Marland Kitchen aspirin EC 325 MG tablet   Oral   Take 1 tablet (325 mg total) by mouth daily.   30 tablet   0   . atorvastatin (LIPITOR) 40 MG tablet   Oral   Take 1 tablet (40 mg total) by mouth daily at 6 PM.   30 tablet   12   . carvedilol (COREG) 25 MG tablet   Oral  Take 1 tablet (25 mg total) by mouth 2 (two) times daily with a meal.   60 tablet   3   . fluticasone (VERAMYST) 27.5 MCG/SPRAY nasal spray   Nasal   Place 2 sprays into the nose daily.           . irbesartan (AVAPRO) 300 MG tablet   Oral   Take 1 tablet (300 mg total) by mouth daily.   30 tablet   3   . loratadine (CLARITIN) 10 MG tablet   Oral   Take 10 mg by mouth daily.           . meclizine (ANTIVERT) 25 MG tablet   Oral   Take 25 mg by mouth 3 (three) times daily as needed (for itching).         . Multiple Vitamin (MULTIVITAMIN WITH MINERALS) TABS   Oral   Take 1 tablet by mouth daily.         . pantoprazole (PROTONIX) 40 MG tablet   Oral   Take 40 mg by mouth daily.          BP 162/84  Pulse 71  Temp(Src) 97.8 F (36.6 C) (Oral)  Resp 19  SpO2 100% Physical  Exam  Nursing note and vitals reviewed. Constitutional: She is oriented to person, place, and time. She appears well-developed and well-nourished. No distress.  HENT:  Head: Normocephalic and atraumatic.  Eyes: EOM are normal.  Cardiovascular: Normal rate and intact distal pulses.   Pulmonary/Chest: Effort normal. No respiratory distress.  Abdominal: Soft. Bowel sounds are normal. She exhibits no distension. There is no tenderness.  Neurological: She is alert and oriented to person, place, and time. No cranial nerve deficit. She exhibits normal muscle tone. Coordination and gait normal. GCS eye subscore is 4. GCS verbal subscore is 5. GCS motor subscore is 6.  Skin: Skin is warm. She is not diaphoretic.  Psychiatric: She has a normal mood and affect.    ED Course   Procedures (including critical care time)  Labs Reviewed - No data to display No results found. 1. Hypertension    ra sat is 100% and I interpret to be normal MDM  Pt with asymptomatic HTN, she is reasured.  Subsequent BP in the ED showed a value of 162/90.  Pt was reassured at that.  I advised not to take BP so often, to follow up with PCP thsi week.  She needs to be evaluated for symptoms such as CP, focal deficits and this is discussed with pt and family.    Gavin Pound. Oletta Lamas, MD 02/27/13 4098

## 2013-02-26 NOTE — ED Notes (Signed)
PT. REPORTS ELEVATED BLOOD PRESSURE AT HOME AT 6 PM THIS EVENING 204/108 , SLIGHT GENERALIZED WEAKNESS , DENIES PAIN OR SOB . PT. DID NOT MISS ANY OF HER ( ANTIHYPERTENSIVE ) MEDICATIONS .

## 2013-03-12 ENCOUNTER — Encounter: Payer: Self-pay | Admitting: Cardiology

## 2013-03-12 DIAGNOSIS — I272 Pulmonary hypertension, unspecified: Secondary | ICD-10-CM | POA: Insufficient documentation

## 2013-03-12 DIAGNOSIS — I5181 Takotsubo syndrome: Secondary | ICD-10-CM | POA: Insufficient documentation

## 2013-03-12 DIAGNOSIS — E7800 Pure hypercholesterolemia, unspecified: Secondary | ICD-10-CM | POA: Insufficient documentation

## 2013-03-12 DIAGNOSIS — C55 Malignant neoplasm of uterus, part unspecified: Secondary | ICD-10-CM | POA: Insufficient documentation

## 2013-03-12 DIAGNOSIS — I493 Ventricular premature depolarization: Secondary | ICD-10-CM | POA: Insufficient documentation

## 2013-03-12 DIAGNOSIS — I10 Essential (primary) hypertension: Secondary | ICD-10-CM | POA: Insufficient documentation

## 2013-03-15 ENCOUNTER — Other Ambulatory Visit (INDEPENDENT_AMBULATORY_CARE_PROVIDER_SITE_OTHER): Payer: Medicare Other

## 2013-03-15 ENCOUNTER — Ambulatory Visit (INDEPENDENT_AMBULATORY_CARE_PROVIDER_SITE_OTHER): Payer: Medicare Other | Admitting: Cardiology

## 2013-03-15 ENCOUNTER — Encounter: Payer: Self-pay | Admitting: Cardiology

## 2013-03-15 VITALS — BP 136/86 | HR 60 | Ht 61.0 in | Wt 109.0 lb

## 2013-03-15 DIAGNOSIS — E785 Hyperlipidemia, unspecified: Secondary | ICD-10-CM

## 2013-03-15 DIAGNOSIS — I4949 Other premature depolarization: Secondary | ICD-10-CM

## 2013-03-15 DIAGNOSIS — G459 Transient cerebral ischemic attack, unspecified: Secondary | ICD-10-CM

## 2013-03-15 DIAGNOSIS — I5181 Takotsubo syndrome: Secondary | ICD-10-CM

## 2013-03-15 DIAGNOSIS — I493 Ventricular premature depolarization: Secondary | ICD-10-CM

## 2013-03-15 DIAGNOSIS — I10 Essential (primary) hypertension: Secondary | ICD-10-CM

## 2013-03-15 LAB — HEPATIC FUNCTION PANEL
ALT: 14 U/L (ref 0–35)
AST: 16 U/L (ref 0–37)
Albumin: 3.1 g/dL — ABNORMAL LOW (ref 3.5–5.2)
Alkaline Phosphatase: 53 U/L (ref 39–117)
Bilirubin, Direct: 0 mg/dL (ref 0.0–0.3)
Total Bilirubin: 0.6 mg/dL (ref 0.3–1.2)
Total Protein: 7.8 g/dL (ref 6.0–8.3)

## 2013-03-15 LAB — LIPID PANEL
Cholesterol: 71 mg/dL (ref 0–200)
HDL: 17.4 mg/dL — ABNORMAL LOW (ref >39.00)
LDL Cholesterol: 41 mg/dL (ref 0–99)
Total CHOL/HDL Ratio: 4
Triglycerides: 64 mg/dL (ref 0.0–149.0)
VLDL: 12.8 mg/dL (ref 0.0–40.0)

## 2013-03-15 NOTE — Assessment & Plan Note (Signed)
The patient was assessed by neurology when she had her TIA in the hospital. At first she was on Plavix only. Now she is on full dose aspirin. She has followup appointment scheduled with neurology in October.

## 2013-03-15 NOTE — Patient Instructions (Signed)
Your physician recommends that you continue on your current medications as directed. Please refer to the Current Medication list given to you today.  Your physician recommends that you schedule a follow-up appointment in: 3 months  

## 2013-03-15 NOTE — Assessment & Plan Note (Signed)
Patient is not having any significant palpitations. There is no syncope or presyncope. No further workup.

## 2013-03-15 NOTE — Assessment & Plan Note (Signed)
Patient has significant left ventricular dysfunction with her event in June, 2014. Fortunately your ejection fraction improved to 45-50% by echo later in June, 2014. I am hopeful she will have returned to normal function. I decided not to repeat her echo today. I will see her for followup and consider a followup echo at that time. She is on appropriate medications for left ventricular dysfunction and hypertension.  As part of today's evaluation I spent greater than 25 minutes total with her care. More than half of this time was spent with direct contact with her reviewing her symptoms and her overall situation.

## 2013-03-15 NOTE — Assessment & Plan Note (Signed)
Blood pressures control today. The patient had been seen in the emergency room in July, 2014. Her blood pressure was elevated at that time. However it improved and she was discharged home.

## 2013-03-15 NOTE — Progress Notes (Signed)
HPI  Patient is seen today to followup her cardiomyopathy. She had an episode of Takotsubo syndrome. Her ejection fraction was as low as 25-30%. Followup echo showed improvement to 45-50%. I have not seen the patient in the office before. I had seen her in the hospital. I reviewed the complete note written by Mr. Alben Spittle from the office January 19, 2013. I've also gone back and reviewed the hospital records. In particular I have reviewed data from neurology.  In addition I reviewed the emergency room note from February 27, 2013. The patient was seen with hypertension at that time. Her meds were adjusted.  Patient is feeling relatively well. She has a cough that appears to be an upper respiratory issue. She's not having any significant chest pain. She's going about usual activities.  Allergies  Allergen Reactions  . Cetirizine Hcl Itching  . Plavix (Clopidogrel Bisulfate) Itching  . Xyzal (Cetirizine Hcl) Itching    Current Outpatient Prescriptions  Medication Sig Dispense Refill  . alendronate (FOSAMAX) 70 MG tablet Take 70 mg by mouth every 7 (seven) days. Take with a full glass of water on an empty stomach.      Marland Kitchen aspirin EC 325 MG tablet Take 1 tablet (325 mg total) by mouth daily.  30 tablet  0  . atorvastatin (LIPITOR) 40 MG tablet Take 1 tablet (40 mg total) by mouth daily at 6 PM.  30 tablet  12  . budesonide-formoterol (SYMBICORT) 160-4.5 MCG/ACT inhaler Inhale 2 puffs into the lungs 2 (two) times daily.      . carvedilol (COREG) 25 MG tablet Take 1 tablet (25 mg total) by mouth 2 (two) times daily with a meal.  60 tablet  3  . dextromethorphan (DELSYM) 30 MG/5ML liquid Take 60 mg by mouth as needed for cough. Currently taking per Patient      . fluticasone (VERAMYST) 27.5 MCG/SPRAY nasal spray Place 2 sprays into the nose daily.        . irbesartan (AVAPRO) 300 MG tablet Take 1 tablet (300 mg total) by mouth daily.  30 tablet  3  . loratadine (CLARITIN) 10 MG tablet Take 10 mg by mouth  daily.        . Multiple Vitamin (MULTIVITAMIN WITH MINERALS) TABS Take 1 tablet by mouth daily.      . pantoprazole (PROTONIX) 40 MG tablet Take 40 mg by mouth daily.       No current facility-administered medications for this visit.    History   Social History  . Marital Status: Widowed    Spouse Name: N/A    Number of Children: N/A  . Years of Education: N/A   Occupational History  . Not on file.   Social History Main Topics  . Smoking status: Never Smoker   . Smokeless tobacco: Never Used  . Alcohol Use: No  . Drug Use: No  . Sexually Active: No   Other Topics Concern  . Not on file   Social History Narrative   Lives in [place] with her son.  Her dtr also lives nearby.    History reviewed. No pertinent family history.  Past Medical History  Diagnosis Date  . Hypertension   . Asthma   . Seasonal allergies   . High cholesterol   . GERD (gastroesophageal reflux disease)   . Uterine cancer   . Takotsubo cardiomyopathy     a. NSTEMI 2/2 takotsubo 01/2013, no significant CAD by cath, EF 25%;  b. 01/2013 Echo: EF 25-30%,  mod LVH, Sev HK in all segments except base. Mild/mod RV dysfxn, PASP .;  c.  Echo 01/17/13: Moderate LVH, EF 45-50%, diffuse HK, PASP 37  . Pulmonary HTN     a. Mod pulm HTN by cath 01/2013 (PASP ).  . PVC's (premature ventricular contractions)     a. Noted during 01/2013 admission.    Past Surgical History  Procedure Laterality Date  . Abdominal hysterectomy  1999    Patient Active Problem List   Diagnosis Date Noted  . Pulmonary HTN   . PVC's (premature ventricular contractions)   . Uterine cancer   . High cholesterol   . Hypertension   . Takotsubo cardiomyopathy   . TIA (transient ischemic attack) 01/15/2013  . GERD (gastroesophageal reflux disease) 01/15/2013    ROS   Patient denies fever, chills, headache, sweats, rash, change in vision, change in hearing, chest pain, nausea vomiting, urinary symptoms. All other systems are  reviewed and are negative. This  PHYSICAL EXAM  Patient is oriented to person time and place. Affect is normal. There is no jugulovenous distention. Lungs are clear. Respiratory effort is nonlabored. Cardiac exam reveals S1 and S2. There no clicks or significant murmurs. Abdomen is soft. There is no peripheral edema. There no musculoskeletal deformities. There are no skin rashes.  Filed Vitals:   03/15/13 0950  BP: 136/86  Pulse: 60  Height: 5\' 1"  (1.549 m)  Weight: 109 lb (49.442 kg)     ASSESSMENT & PLAN

## 2013-03-21 ENCOUNTER — Telehealth: Payer: Self-pay | Admitting: *Deleted

## 2013-03-21 NOTE — Telephone Encounter (Signed)
Message copied by Tarri Fuller on Mon Mar 21, 2013  9:52 AM ------      Message from: [place], [place] T      Created: Sun Mar 20, 2013  6:05 AM       Lipids ok      LFTs ok.      Continue with current treatment plan.      Tereso Newcomer, PA-C        03/20/2013 6:05 AM ------

## 2013-03-21 NOTE — Telephone Encounter (Signed)
pt notified about lab results with verbal understanding  

## 2013-05-04 ENCOUNTER — Encounter: Payer: Self-pay | Admitting: Neurology

## 2013-05-04 ENCOUNTER — Ambulatory Visit (INDEPENDENT_AMBULATORY_CARE_PROVIDER_SITE_OTHER): Payer: 59 | Admitting: Neurology

## 2013-05-04 VITALS — BP 151/82 | HR 68 | Temp 98.0°F | Ht 61.5 in | Wt 111.0 lb

## 2013-05-04 DIAGNOSIS — I6350 Cerebral infarction due to unspecified occlusion or stenosis of unspecified cerebral artery: Secondary | ICD-10-CM

## 2013-05-04 NOTE — Progress Notes (Signed)
[facility] [address] [place]. [place] [address] [phone]       OFFICE FOLLOW-UP NOTE  Deanna. Deanna Brown Date of Birth:  15-Nov-1945 Medical Record Number:  [id number]   HPI: Deanna Brown is a 17 year Asian lady seen for the first office followup visit today following hospitalization on 01/15/39 and 4 stroke. She had recently been discharged from the hospital following an admission for myocardial infarction. She was noticed to have sudden onset of dysarthria and right facial droop and difficulty walking and was brought by family to the hospital. Her symptoms resolved quickly after presentation and she was not considered to be a TPA candidate. CT scan of the head was unremarkable but MRI scans subsequently showed small left occipital and right thalamic infarcts. Lipid profile and hemoglobin A1c were normal. MRA of the brain was motion degraded but showed no large vessel occlusion. Transthoracic echocardiogram showed decreased ejection fraction of 25-30% with global hypokinesis which was felt to be consistent with a "takutsubo-cardiomyopathy. Carotid Dopplers were unremarkable. She did very well and had no deficits at the time of discharge. She was placed on Plavix but states she was unable to tolerated as she developed itching and this was hence changed back to aspirin. She is tolerating that well without any bleeding, bruising or other side effects. She does have mild dysphagia and because of her GERD and takes Protonix for it. She has trouble swallowing capsules and take pills and in fact has been cutting her aspirin to 2 and taking it. She had repeat echocardiogram done by her cardiologist Dr. Myrtis Ser which showed improvement. She has no complaints today. She states her blood pressure is under good control.  ROS:   14 system review of systems is positive for no complaints today PMH:  Past Medical History  Diagnosis Date  . Hypertension   . Asthma   . Seasonal allergies   .  High cholesterol   . GERD (gastroesophageal reflux disease)   . Uterine cancer   . Takotsubo cardiomyopathy     a. NSTEMI 2/2 takotsubo 01/2013, no significant CAD by cath, EF 25%;  b. 01/2013 Echo: EF 25-30%, mod LVH, Sev HK in all segments except base. Mild/mod RV dysfxn, PASP .;  c.  Echo 01/17/13: Moderate LVH, EF 45-50%, diffuse HK, PASP 37  . Pulmonary HTN     a. Mod pulm HTN by cath 01/2013 (PASP ).  . PVC's (premature ventricular contractions)     a. Noted during 01/2013 admission.    Social History:  History   Social History  . Marital Status: Widowed    Spouse Name: N/A    Number of Children: N/A  . Years of Education: N/A   Occupational History  . Not on file.   Social History Main Topics  . Smoking status: Never Smoker   . Smokeless tobacco: Never Used  . Alcohol Use: No  . Drug Use: No  . Sexual Activity: No   Other Topics Concern  . Not on file   Social History Narrative   Lives in [place] with her son.  Her dtr also lives nearby.    Medications:   Current Outpatient Prescriptions on File Prior to Visit  Medication Sig Dispense Refill  . alendronate (FOSAMAX) 70 MG tablet Take 70 mg by mouth every 7 (seven) days. Take with a full glass of water on an empty stomach.      Marland Kitchen aspirin EC 325 MG tablet Take 1 tablet (325 mg total)  by mouth daily.  30 tablet  0  . atorvastatin (LIPITOR) 40 MG tablet Take 1 tablet (40 mg total) by mouth daily at 6 PM.  30 tablet  12  . budesonide-formoterol (SYMBICORT) 160-4.5 MCG/ACT inhaler Inhale 2 puffs into the lungs 2 (two) times daily.      . carvedilol (COREG) 25 MG tablet Take 1 tablet (25 mg total) by mouth 2 (two) times daily with a meal.  60 tablet  3  . fluticasone (VERAMYST) 27.5 MCG/SPRAY nasal spray Place 2 sprays into the nose daily.        . irbesartan (AVAPRO) 300 MG tablet Take 1 tablet (300 mg total) by mouth daily.  30 tablet  3  . loratadine (CLARITIN) 10 MG tablet Take 10 mg by mouth daily.        .  Multiple Vitamin (MULTIVITAMIN WITH MINERALS) TABS Take 1 tablet by mouth daily.      . pantoprazole (PROTONIX) 40 MG tablet Take 40 mg by mouth daily.      Marland Kitchen dextromethorphan (DELSYM) 30 MG/5ML liquid Take 60 mg by mouth as needed for cough. Currently taking per Patient       No current facility-administered medications on file prior to visit.    Allergies:   Allergies  Allergen Reactions  . Cetirizine Hcl Itching  . Plavix [Clopidogrel Bisulfate] Itching  . Xyzal [Cetirizine Hcl] Itching    Physical Exam General: well developed, well nourished, seated, in no evident distress Head: head normocephalic and atraumatic. Orohparynx benign Neck: supple with no carotid or supraclavicular bruits Cardiovascular: regular rate and rhythm, no murmurs Musculoskeletal: no deformity Skin:  no rash/petichiae Vascular:  Normal pulses all extremities Filed Vitals:   05/04/13 1510  BP: 151/82  Pulse: 68  Temp: 98 F (36.7 C)    Neurologic Exam Mental Status: Awake and fully alert. Oriented to place and time. Recent and remote memory intact. Attention span, concentration and fund of knowledge appropriate. Mood and affect appropriate.  Cranial Nerves: Fundoscopic exam reveals sharp disc margins. Pupils equal, briskly reactive to light. Extraocular movements full without nystagmus. Visual fields full to confrontation. Hearing intact. Facial sensation intact. Face, tongue, palate moves normally and symmetrically.  Motor: Normal bulk and tone. Normal strength in all tested extremity muscles. Sensory.: intact to tough and pinprick and vibratory.  Coordination: Rapid alternating movements normal in all extremities. Finger-to-nose and heel-to-shin performed accurately bilaterally. Gait and Station: Arises from chair without difficulty. Stance is normal. Gait demonstrates normal stride length and balance . Able to heel, toe and tandem walk without difficulty.  Reflexes: 1+ and symmetric. Toes downgoing.    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 Vascular risk factors of hypertension, hyperlipidemia, myocardial infarction and cardiomyopathy    PLAN: Continue Aspirin for stroke prevention and strict control of hypertension with blood pressure goal below 130/90 and lipids with LDL cholesterol goal below 100 mg percent. Return for follow up in 4 months with Deanna Fife, NP or call earlier if necessary

## 2013-05-04 NOTE — Patient Instructions (Addendum)
Continue Aspirin for stroke prevention and strict control of hypertension with blood pressure goal below 130/90 and lipids with LDL cholesterol goal below 100 mg percent. Return for follow up in 4 months with Larita Fife, NP or call earlier if necessary

## 2013-05-11 ENCOUNTER — Other Ambulatory Visit: Payer: Self-pay

## 2013-05-11 MED ORDER — CARVEDILOL 25 MG PO TABS: 25.0000 mg | ORAL_TABLET | Freq: Two times a day (BID) | ORAL | Status: DC

## 2013-05-15 ENCOUNTER — Other Ambulatory Visit (HOSPITAL_COMMUNITY): Payer: Self-pay | Admitting: Physician Assistant

## 2013-06-13 ENCOUNTER — Ambulatory Visit (INDEPENDENT_AMBULATORY_CARE_PROVIDER_SITE_OTHER): Payer: Medicare Other | Admitting: Cardiology

## 2013-06-13 ENCOUNTER — Encounter: Payer: Self-pay | Admitting: Cardiology

## 2013-06-13 VITALS — BP 136/84 | HR 62 | Ht 61.0 in | Wt 119.0 lb

## 2013-06-13 DIAGNOSIS — Z8673 Personal history of transient ischemic attack (TIA), and cerebral infarction without residual deficits: Secondary | ICD-10-CM | POA: Insufficient documentation

## 2013-06-13 DIAGNOSIS — E7800 Pure hypercholesterolemia, unspecified: Secondary | ICD-10-CM

## 2013-06-13 DIAGNOSIS — I5181 Takotsubo syndrome: Secondary | ICD-10-CM

## 2013-06-13 DIAGNOSIS — I6350 Cerebral infarction due to unspecified occlusion or stenosis of unspecified cerebral artery: Secondary | ICD-10-CM

## 2013-06-13 DIAGNOSIS — I639 Cerebral infarction, unspecified: Secondary | ICD-10-CM

## 2013-06-13 DIAGNOSIS — I10 Essential (primary) hypertension: Secondary | ICD-10-CM

## 2013-06-13 NOTE — Patient Instructions (Signed)

## 2013-06-13 NOTE — Assessment & Plan Note (Signed)
She does not have any significant residual from her CNS events in June, 2014. She will remain on aspirin full-strength for another 6 months and then changed 81 mg daily.

## 2013-06-13 NOTE — Assessment & Plan Note (Signed)
Her blood pressure is controlled. No change in therapy. 

## 2013-06-13 NOTE — Assessment & Plan Note (Signed)
Her lipids are controlled. No change in therapy.

## 2013-06-13 NOTE — Assessment & Plan Note (Signed)
Her ejection fraction had improved from 25% to 45/50% by late June. It is now time to see if it is normalize completely. Followup two-dimensional echo will be done.

## 2013-06-13 NOTE — Progress Notes (Signed)
HPI  Patient is seen to followup her cardiac event from June, 2014. She had a Takotsubo event. The ejection fraction improved from 25% to 45% from early June to late June. There is not been a followup echo since. Shortly after her hospitalization for her cardiac event, she had a neurologic event. She improved from this. However her scans revealed an abnormal occipital and thalamic findings. These were felt to be related to her cardiac events. Plavix was recommended. She had itching from this. Therefore she has been on aspirin. She has been followed up by neurology.  Allergies  Allergen Reactions  . Cetirizine Hcl Itching  . Plavix [Clopidogrel Bisulfate] Itching  . Xyzal [Cetirizine Hcl] Itching    Current Outpatient Prescriptions  Medication Sig Dispense Refill  . alendronate (FOSAMAX) 70 MG tablet Take 70 mg by mouth every 7 (seven) days. Take with a full glass of water on an empty stomach.      Marland Kitchen amLODipine (NORVASC) 5 MG tablet Take 5 mg by mouth at bedtime.      Marland Kitchen aspirin EC 325 MG tablet Take 1 tablet (325 mg total) by mouth daily.  30 tablet  0  . atorvastatin (LIPITOR) 40 MG tablet Take 1 tablet (40 mg total) by mouth daily at 6 PM.  30 tablet  12  . budesonide-formoterol (SYMBICORT) 160-4.5 MCG/ACT inhaler Inhale 2 puffs into the lungs 2 (two) times daily.      . carvedilol (COREG) 25 MG tablet Take 1 tablet (25 mg total) by mouth 2 (two) times daily with a meal.  60 tablet  6  . fluticasone (FLONASE) 50 MCG/ACT nasal spray Place 2 sprays into both nostrils as needed for allergies or rhinitis.      Marland Kitchen irbesartan (AVAPRO) 300 MG tablet TAKE 1 TABLET (300 MG TOTAL) BY MOUTH DAILY.  30 tablet  3  . loratadine (CLARITIN) 10 MG tablet Take 10 mg by mouth daily.        . Multiple Vitamin (MULTIVITAMIN WITH MINERALS) TABS Take 1 tablet by mouth daily.      . pantoprazole (PROTONIX) 40 MG tablet Take 40 mg by mouth daily.       No current facility-administered medications for this  visit.    History   Social History  . Marital Status: Widowed    Spouse Name: N/A    Number of Children: N/A  . Years of Education: N/A   Occupational History  . Not on file.   Social History Main Topics  . Smoking status: Never Smoker   . Smokeless tobacco: Never Used  . Alcohol Use: No  . Drug Use: No  . Sexual Activity: No   Other Topics Concern  . Not on file   Social History Narrative   Lives in [place] with her son.  Her dtr also lives nearby.    History reviewed. No pertinent family history.  Past Medical History  Diagnosis Date  . Hypertension   . Asthma   . Seasonal allergies   . High cholesterol   . GERD (gastroesophageal reflux disease)   . Uterine cancer   . Takotsubo cardiomyopathy     a. NSTEMI 2/2 takotsubo 01/2013, no significant CAD by cath, EF 25%;  b. 01/2013 Echo: EF 25-30%, mod LVH, Sev HK in all segments except base. Mild/mod RV dysfxn, PASP .;  c.  Echo 01/17/13: Moderate LVH, EF 45-50%, diffuse HK, PASP 37  . Pulmonary HTN     a. Mod pulm HTN by  cath 01/2013 (PASP ).  . PVC's (premature ventricular contractions)     a. Noted during 01/2013 admission.    Past Surgical History  Procedure Laterality Date  . Abdominal hysterectomy  1999    Patient Active Problem List   Diagnosis Date Noted  . CVA (cerebral vascular accident) 06/13/2013  . Pulmonary HTN   . PVC's (premature ventricular contractions)   . Uterine cancer   . High cholesterol   . Hypertension   . Takotsubo cardiomyopathy   . TIA (transient ischemic attack) 01/15/2013  . GERD (gastroesophageal reflux disease) 01/15/2013    ROS   Patient denies fever, chills, headache, sweats, rash, change in vision, change in hearing, chest pain, cough, nausea vomiting, urinary symptoms. All other systems are reviewed and are negative.  PHYSICAL EXAM   Patient is very stable. There is no jugulovenous distention. Lungs are clear. Respiratory effort is nonlabored. Cardiac exam reveals  an S1 and S2. There no clicks or significant murmurs. The abdomen is soft. There is no peripheral edema.  Filed Vitals:   06/13/13 1016  BP: 136/84  Pulse: 62  Height: 5\' 1"  (1.549 m)  Weight: 119 lb (53.978 kg)     ASSESSMENT & PLAN

## 2013-07-04 ENCOUNTER — Other Ambulatory Visit (HOSPITAL_COMMUNITY): Payer: Medicare Other

## 2013-07-11 ENCOUNTER — Encounter: Payer: Self-pay | Admitting: Cardiology

## 2013-07-12 ENCOUNTER — Ambulatory Visit (HOSPITAL_COMMUNITY): Payer: Medicare Other | Attending: Cardiology | Admitting: Cardiology

## 2013-07-12 ENCOUNTER — Encounter: Payer: Self-pay | Admitting: Cardiology

## 2013-07-12 DIAGNOSIS — I10 Essential (primary) hypertension: Secondary | ICD-10-CM | POA: Insufficient documentation

## 2013-07-12 DIAGNOSIS — E785 Hyperlipidemia, unspecified: Secondary | ICD-10-CM | POA: Insufficient documentation

## 2013-07-12 DIAGNOSIS — I2789 Other specified pulmonary heart diseases: Secondary | ICD-10-CM | POA: Insufficient documentation

## 2013-07-12 DIAGNOSIS — Z8673 Personal history of transient ischemic attack (TIA), and cerebral infarction without residual deficits: Secondary | ICD-10-CM | POA: Insufficient documentation

## 2013-07-12 DIAGNOSIS — I359 Nonrheumatic aortic valve disorder, unspecified: Secondary | ICD-10-CM | POA: Insufficient documentation

## 2013-07-12 DIAGNOSIS — I5181 Takotsubo syndrome: Secondary | ICD-10-CM

## 2013-07-12 DIAGNOSIS — I079 Rheumatic tricuspid valve disease, unspecified: Secondary | ICD-10-CM | POA: Insufficient documentation

## 2013-07-12 DIAGNOSIS — I358 Other nonrheumatic aortic valve disorders: Secondary | ICD-10-CM | POA: Insufficient documentation

## 2013-07-12 NOTE — Progress Notes (Signed)
Quick Note:  Unable to leave message. No answer at home#. Work# stated they could not take a message. ______

## 2013-07-12 NOTE — Progress Notes (Signed)
Echo performed. 

## 2013-08-08 ENCOUNTER — Other Ambulatory Visit: Payer: Self-pay | Admitting: Family Medicine

## 2013-08-08 ENCOUNTER — Ambulatory Visit
Admission: RE | Admit: 2013-08-08 | Discharge: 2013-08-08 | Disposition: A | Discharge status: Home / Self Care | Payer: Medicare Other | Source: Ambulatory Visit | Attending: Family Medicine | Admitting: Family Medicine

## 2013-08-08 DIAGNOSIS — R0689 Other abnormalities of breathing: Secondary | ICD-10-CM

## 2013-08-17 ENCOUNTER — Ambulatory Visit: Payer: 59 | Admitting: Neurology

## 2013-09-10 ENCOUNTER — Other Ambulatory Visit (HOSPITAL_COMMUNITY): Payer: Self-pay | Admitting: Cardiology

## 2013-12-11 ENCOUNTER — Other Ambulatory Visit: Payer: Self-pay | Admitting: Cardiology

## 2013-12-13 ENCOUNTER — Other Ambulatory Visit: Payer: Self-pay | Admitting: Cardiology

## 2013-12-29 ENCOUNTER — Ambulatory Visit (INDEPENDENT_AMBULATORY_CARE_PROVIDER_SITE_OTHER): Payer: Medicare Other | Admitting: Neurology

## 2013-12-29 ENCOUNTER — Encounter: Payer: Self-pay | Admitting: Neurology

## 2013-12-29 ENCOUNTER — Encounter (INDEPENDENT_AMBULATORY_CARE_PROVIDER_SITE_OTHER): Payer: Self-pay

## 2013-12-29 VITALS — BP 143/86 | HR 73 | Ht 62.0 in | Wt 113.0 lb

## 2013-12-29 DIAGNOSIS — R0989 Other specified symptoms and signs involving the circulatory and respiratory systems: Secondary | ICD-10-CM

## 2013-12-29 NOTE — Patient Instructions (Signed)
I had a long discussion with the patient regarding her risk for stroke and discuss secondary stroke prevention strategies. Continue aspirin and maintain strict control of hypertension with blood pressure goal below 130/90. Check screening followup carotid ultrasound study. Return for followup in a year or call earlier if necessary.

## 2013-12-30 NOTE — Progress Notes (Signed)
[facility] [address] [place]. [place] [address] [phone]       OFFICE FOLLOW-UP NOTE  Deanna Brown Date of Birth:  1945/10/16 Medical Record Number:  [id number]   HPI: Deanna Brown is a 72 year Asian lady seen for the first office followup visit today following hospitalization on 01/15/39 and 4 stroke. She had recently been discharged from the hospital following an admission for myocardial infarction. She was noticed to have sudden onset of dysarthria and right facial droop and difficulty walking and was brought by family to the hospital. Her symptoms resolved quickly after presentation and she was not considered to be a TPA candidate. CT scan of the head was unremarkable but MRI scans subsequently showed small left occipital and right thalamic infarcts. Lipid profile and hemoglobin A1c were normal. MRA of the brain was motion degraded but showed no large vessel occlusion. Transthoracic echocardiogram showed decreased ejection fraction of 25-30% with global hypokinesis which was felt to be consistent with a "takutsubo-cardiomyopathy. Carotid Dopplers were unremarkable. She did very well and had no deficits at the time of discharge. She was placed on Plavix but states she was unable to tolerated as she developed itching and this was hence changed back to aspirin. She is tolerating that well without any bleeding, bruising or other side effects. She does have mild dysphagia and because of her GERD and takes Protonix for it. She has trouble swallowing capsules and take pills and in fact has been cutting her aspirin to 2 and taking it. She had repeat echocardiogram done by her cardiologist Dr. Ron Parker which showed improvement. She has no complaints today. She states her blood pressure is under good control. Update 12/29/2013 : She returns for followup last visit 6 months ago. She is doing well without recurrence of her GI symptoms. She states her blood pressure is quite well  controlled at home though it is slightly elevated at 146/86 office today. She has not had  interval medical problems. She continues to take aspirin and Lipitor and is tolerating them without significant bruising, bleeding or myalgias. She has no new complaints today. ROS:   14 system review of systems is positive for no complaints today PMH:  Past Medical History  Diagnosis Date  . Hypertension   . Asthma   . Seasonal allergies   . High cholesterol   . GERD (gastroesophageal reflux disease)   . Uterine cancer   . Takotsubo cardiomyopathy     a. NSTEMI 2/2 takotsubo 01/2013, no significant CAD by cath, EF 25%;  b. 01/2013 Echo: EF 25-30%, mod LVH, Sev HK in all segments except base. Mild/mod RV dysfxn, PASP 31mmHg.;  c.  Echo 01/17/13: Moderate LVH, EF 45-50%, diffuse HK, PASP 37  . Pulmonary HTN     a. Mod pulm HTN by cath 01/2013 (PASP 26mmHg).  . PVC's (premature ventricular contractions)     a. Noted during 01/2013 admission.    Social History:  History   Social History  . Marital Status: Widowed    Spouse Name: N/A    Number of Children: 3  . Years of Education: 12th   Occupational History  . clean building    Social History Main Topics  . Smoking status: Never Smoker   . Smokeless tobacco: Never Used  . Alcohol Use: No  . Drug Use: No  . Sexual Activity: No   Other Topics Concern  . Not on file   Social History Narrative   Lives in [place]  with her son.  Her dtr also lives nearby.    Medications:   Current Outpatient Prescriptions on File Prior to Visit  Medication Sig Dispense Refill  . alendronate (FOSAMAX) 70 MG tablet Take 70 mg by mouth every 7 (seven) days. Take with a full glass of water on an empty stomach.      Marland Kitchen amLODipine (NORVASC) 5 MG tablet Take 5 mg by mouth at bedtime.      Marland Kitchen aspirin EC 325 MG tablet Take 1 tablet (325 mg total) by mouth daily.  30 tablet  0  . atorvastatin (LIPITOR) 40 MG tablet Take 1 tablet (40 mg total) by mouth daily at 6 PM.  30  tablet  12  . budesonide-formoterol (SYMBICORT) 160-4.5 MCG/ACT inhaler Inhale 2 puffs into the lungs 2 (two) times daily.      . carvedilol (COREG) 25 MG tablet TAKE 1 TABLET (25 MG TOTAL) BY MOUTH 2 (TWO) TIMES DAILY WITH A MEAL.  60 tablet  0  . fluticasone (FLONASE) 50 MCG/ACT nasal spray Place 2 sprays into both nostrils as needed for allergies or rhinitis.      Marland Kitchen irbesartan (AVAPRO) 300 MG tablet TAKE 1 TABLET (300 MG TOTAL) BY MOUTH DAILY.  30 tablet  3  . loratadine (CLARITIN) 10 MG tablet Take 10 mg by mouth daily.        . Multiple Vitamin (MULTIVITAMIN WITH MINERALS) TABS Take 1 tablet by mouth daily.      . pantoprazole (PROTONIX) 40 MG tablet Take 40 mg by mouth daily.       No current facility-administered medications on file prior to visit.    Allergies:   Allergies  Allergen Reactions  . Cetirizine Hcl Itching  . Plavix [Clopidogrel Bisulfate] Itching  . Xyzal [Cetirizine Hcl] Itching    Physical Exam General: well developed, well nourished, seated, in no evident distress Head: head normocephalic and atraumatic. Orohparynx benign Neck: supple with no carotid or supraclavicular bruits Cardiovascular: regular rate and rhythm, no murmurs Musculoskeletal: no deformity Skin:  no rash/petichiae Vascular:  Normal pulses all extremities Filed Vitals:   12/29/13 1340  BP: 143/86  Pulse: 73    Neurologic Exam Mental Status: Awake and fully alert. Oriented to place and time. Recent and remote memory intact. Attention span, concentration and fund of knowledge appropriate. Mood and affect appropriate.  Cranial Nerves: Fundoscopic exam reveals sharp disc margins. Pupils equal, briskly reactive to light. Extraocular movements full without nystagmus. Visual fields full to confrontation. Hearing intact. Facial sensation intact. Face, tongue, palate moves normally and symmetrically.  Motor: Normal bulk and tone. Normal strength in all tested extremity muscles. Sensory.: intact to  touch and pinprick and vibratory.  Coordination: Rapid alternating movements normal in all extremities. Finger-to-nose and heel-to-shin performed accurately bilaterally. Gait and Station: Arises from chair without difficulty. Stance is normal. Gait demonstrates normal stride length and balance . Able to heel, toe and tandem walk without difficulty.  Reflexes: 1+ and symmetric. Toes downgoing.   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 Vascular risk factors of hypertension, hyperlipidemia, myocardial infarction and cardiomyopathy    PLAN: I had a long discussion with the patient regarding her risk for stroke and discuss secondary stroke prevention strategies. Continue aspirin and maintain strict control of hypertension with blood pressure goal below 130/90. Check screening followup carotid ultrasound study. Return for followup in  a year or call earlier if necessary.

## 2014-01-01 ENCOUNTER — Encounter: Payer: Self-pay | Admitting: Cardiology

## 2014-01-01 DIAGNOSIS — R9430 Abnormal result of cardiovascular function study, unspecified: Secondary | ICD-10-CM | POA: Insufficient documentation

## 2014-01-02 ENCOUNTER — Encounter: Payer: Self-pay | Admitting: Cardiology

## 2014-01-02 ENCOUNTER — Ambulatory Visit (INDEPENDENT_AMBULATORY_CARE_PROVIDER_SITE_OTHER): Payer: Medicare Other | Admitting: Cardiology

## 2014-01-02 VITALS — BP 131/71 | HR 60 | Ht 62.0 in | Wt 114.0 lb

## 2014-01-02 DIAGNOSIS — I6350 Cerebral infarction due to unspecified occlusion or stenosis of unspecified cerebral artery: Secondary | ICD-10-CM

## 2014-01-02 DIAGNOSIS — R0989 Other specified symptoms and signs involving the circulatory and respiratory systems: Secondary | ICD-10-CM

## 2014-01-02 DIAGNOSIS — I639 Cerebral infarction, unspecified: Secondary | ICD-10-CM

## 2014-01-02 DIAGNOSIS — R9430 Abnormal result of cardiovascular function study, unspecified: Secondary | ICD-10-CM

## 2014-01-02 DIAGNOSIS — I10 Essential (primary) hypertension: Secondary | ICD-10-CM

## 2014-01-02 DIAGNOSIS — I359 Nonrheumatic aortic valve disorder, unspecified: Secondary | ICD-10-CM

## 2014-01-02 DIAGNOSIS — I5181 Takotsubo syndrome: Secondary | ICD-10-CM

## 2014-01-02 DIAGNOSIS — I358 Other nonrheumatic aortic valve disorders: Secondary | ICD-10-CM

## 2014-01-02 NOTE — Patient Instructions (Signed)
Your physician recommends that you continue on your current medications as directed. Please refer to the Current Medication list given to you today.  Your physician wants you to follow-up in: 1 year. You will receive a reminder letter in the mail two months in advance. If you don't receive a letter, please call our office to schedule the follow-up appointment.  

## 2014-01-02 NOTE — Assessment & Plan Note (Signed)
Blood pressure is controlled. No change in therapy. 

## 2014-01-02 NOTE — Assessment & Plan Note (Signed)
There is mild aortic valve sclerosis. No further workup.

## 2014-01-02 NOTE — Assessment & Plan Note (Signed)
Her EF has normalized completely. No further workup. 

## 2014-01-02 NOTE — Progress Notes (Signed)
Patient ID: Deanna Brown, female   DOB: 1946/01/26, 68 y.o.   MRN: [id number]    HPI  Patient is seen today to followup history of Takotsubo event. Fortunately she is recovered nicely I saw her last November, 2014. In December she had an echo that showed completely normal LV function. She is doing well. She has had a neurologic event in the past and is followed by neurology. She was stable with her last visit.  Allergies  Allergen Reactions  . Cetirizine Hcl Itching  . Plavix [Clopidogrel Bisulfate] Itching  . Xyzal [Cetirizine Hcl] Itching    Current Outpatient Prescriptions  Medication Sig Dispense Refill  . albuterol (PROVENTIL) (2.5 MG/3ML) 0.083% nebulizer solution Take 2.5 mg by nebulization 4 (four) times daily.      Marland Kitchen alendronate (FOSAMAX) 70 MG tablet Take 70 mg by mouth every 7 (seven) days. Take with a full glass of water on an empty stomach.      Marland Kitchen amLODipine (NORVASC) 5 MG tablet Take 5 mg by mouth at bedtime.      Marland Kitchen aspirin EC 325 MG tablet Take 1 tablet (325 mg total) by mouth daily.  30 tablet  0  . atorvastatin (LIPITOR) 40 MG tablet Take 1 tablet (40 mg total) by mouth daily at 6 PM.  30 tablet  12  . budesonide-formoterol (SYMBICORT) 160-4.5 MCG/ACT inhaler Inhale 2 puffs into the lungs 2 (two) times daily.      . carvedilol (COREG) 25 MG tablet TAKE 1 TABLET (25 MG TOTAL) BY MOUTH 2 (TWO) TIMES DAILY WITH A MEAL.  60 tablet  0  . fluticasone (FLONASE) 50 MCG/ACT nasal spray Place 2 sprays into both nostrils as needed for allergies or rhinitis.      Marland Kitchen irbesartan (AVAPRO) 300 MG tablet TAKE 1 TABLET (300 MG TOTAL) BY MOUTH DAILY.  30 tablet  3  . loratadine (CLARITIN) 10 MG tablet Take 10 mg by mouth daily.        . montelukast (SINGULAIR) 10 MG tablet Take 10 mg by mouth at bedtime.      . Multiple Vitamin (MULTIVITAMIN WITH MINERALS) TABS Take 1 tablet by mouth daily.      . pantoprazole (PROTONIX) 40 MG tablet Take 40 mg by mouth daily.      . predniSONE  (DELTASONE) 5 MG tablet Take 1 1/2 tab daily       No current facility-administered medications for this visit.    History   Social History  . Marital Status: Widowed    Spouse Name: N/A    Number of Children: 3  . Years of Education: 12th   Occupational History  . clean building    Social History Main Topics  . Smoking status: Never Smoker   . Smokeless tobacco: Never Used  . Alcohol Use: No  . Drug Use: No  . Sexual Activity: No   Other Topics Concern  . Not on file   Social History Narrative   Lives in [place] with her son.  Her dtr also lives nearby.    Family History  Problem Relation Age of Onset  . Heart attack Mother   . Dementia Father     Past Medical History  Diagnosis Date  . Hypertension   . Asthma   . Seasonal allergies   . High cholesterol   . GERD (gastroesophageal reflux disease)   . Uterine cancer   . Takotsubo cardiomyopathy     a. NSTEMI 2/2 takotsubo 01/2013, no significant CAD  by cath, EF 25%;  b. 01/2013 Echo: EF 25-30%, mod LVH, Sev HK in all segments except base. Mild/mod RV dysfxn, PASP 65mmHg.;  c.  Echo 01/17/13: Moderate LVH, EF 45-50%, diffuse HK, PASP 37  . Pulmonary HTN     a. Mod pulm HTN by cath 01/2013 (PASP 5mmHg).  . PVC's (premature ventricular contractions)     a. Noted during 01/2013 admission.  . Ejection fraction     Past Surgical History  Procedure Laterality Date  . Abdominal hysterectomy  1999    Patient Active Problem List   Diagnosis Date Noted  . Ejection fraction   . Aortic valve sclerosis 07/12/2013  . CVA (cerebral vascular accident) 06/13/2013  . Pulmonary HTN   . PVC's (premature ventricular contractions)   . Uterine cancer   . High cholesterol   . Hypertension   . Takotsubo cardiomyopathy   . GERD (gastroesophageal reflux disease) 01/15/2013    ROS   Patient denies fever, chills, headache, sweats, rash, change in vision, change in hearing, chest pain, cough, nausea vomiting, urinary symptoms. All  other systems are reviewed and are negative.   PHYSICAL EXAM   Patient is quite stable. She is oriented to person time and place. Affect is normal. There is no jugulovenous distention. Head is atraumatic. Sclera and conjunctiva are normal. Lungs are clear. Respiratory effort is nonlabored. Cardiac exam reveals S1 and S2. The abdomen is soft. There is no peripheral edema. There no musculoskeletal deformities. There are no skin rashes.  Filed Vitals:   01/02/14 1110  BP: 131/71  Pulse: 60  Height: 5\' 2"  (1.575 m)  Weight: 114 lb (51.71 kg)   EKG is done today and reviewed by me. There is normal sinus rhythm. There is no change from the past.  ASSESSMENT & PLAN

## 2014-01-02 NOTE — Assessment & Plan Note (Signed)
Her EF has normalized completely. No further workup.

## 2014-01-02 NOTE — Assessment & Plan Note (Signed)
The patient is followed by neurology. She takes full dose aspirin for her prior event. She had itching from Plavix and therefore full dose aspirin is being used.

## 2014-01-09 ENCOUNTER — Other Ambulatory Visit: Payer: Self-pay | Admitting: Cardiology

## 2014-01-11 ENCOUNTER — Other Ambulatory Visit: Payer: Self-pay | Admitting: Cardiology

## 2014-01-11 ENCOUNTER — Other Ambulatory Visit: Payer: Self-pay | Admitting: *Deleted

## 2014-01-11 MED ORDER — IRBESARTAN 300 MG PO TABS: ORAL_TABLET | ORAL | Status: DC

## 2014-01-12 ENCOUNTER — Telehealth: Payer: Self-pay | Admitting: Radiology

## 2014-01-19 ENCOUNTER — Ambulatory Visit (INDEPENDENT_AMBULATORY_CARE_PROVIDER_SITE_OTHER): Payer: Medicare Other

## 2014-01-19 DIAGNOSIS — R0989 Other specified symptoms and signs involving the circulatory and respiratory systems: Secondary | ICD-10-CM

## 2014-01-20 ENCOUNTER — Other Ambulatory Visit: Payer: Medicare Other

## 2014-02-02 ENCOUNTER — Other Ambulatory Visit: Payer: Self-pay | Admitting: Cardiology

## 2014-02-07 ENCOUNTER — Encounter: Payer: Self-pay | Admitting: Neurology

## 2014-02-10 ENCOUNTER — Telehealth: Payer: Self-pay | Admitting: Neurology

## 2014-02-18 ENCOUNTER — Other Ambulatory Visit: Payer: Self-pay | Admitting: Cardiology

## 2014-02-20 ENCOUNTER — Other Ambulatory Visit: Payer: Self-pay | Admitting: Cardiology

## 2014-02-20 NOTE — Telephone Encounter (Signed)
Called [facility] and spoke to pharmacy (sharon) and she stated they received refill sent in on 02/02/14 and will have medication ready for pt pick up.    Approved        Disp Refills Start End      atorvastatin (LIPITOR) 40 MG tablet 30 tablet 10 02/02/2014        Sig:  TAKE 1 TABLET (40 MG TOTAL) BY MOUTH DAILY AT 6 PM.      Class:  Normal      DAW:  No      Authorizing Provider:  Carlena Bjornstad, MD      Ordering User:  Juventino Slovak, CMA

## 2014-03-06 ENCOUNTER — Telehealth: Payer: Self-pay | Admitting: *Deleted

## 2014-03-06 NOTE — Telephone Encounter (Signed)
I called and was not able to LM.  Will send letter of normal carotid doppler. DONE.

## 2014-07-06 ENCOUNTER — Other Ambulatory Visit: Payer: Self-pay | Admitting: Cardiology

## 2014-07-08 ENCOUNTER — Other Ambulatory Visit: Payer: Self-pay | Admitting: Cardiology

## 2014-07-08 NOTE — Telephone Encounter (Signed)
Rx was sent to pharmacy electronically. 

## 2014-07-13 ENCOUNTER — Encounter (HOSPITAL_COMMUNITY): Payer: Self-pay | Admitting: Cardiology

## 2014-10-09 DIAGNOSIS — Z1231 Encounter for screening mammogram for malignant neoplasm of breast: Secondary | ICD-10-CM | POA: Diagnosis not present

## 2014-10-09 DIAGNOSIS — Z803 Family history of malignant neoplasm of breast: Secondary | ICD-10-CM | POA: Diagnosis not present

## 2014-10-12 DIAGNOSIS — K219 Gastro-esophageal reflux disease without esophagitis: Secondary | ICD-10-CM | POA: Diagnosis not present

## 2014-10-12 DIAGNOSIS — R1310 Dysphagia, unspecified: Secondary | ICD-10-CM | POA: Diagnosis not present

## 2014-10-12 DIAGNOSIS — K222 Esophageal obstruction: Secondary | ICD-10-CM | POA: Diagnosis not present

## 2014-10-17 DIAGNOSIS — Z0000 Encounter for general adult medical examination without abnormal findings: Secondary | ICD-10-CM | POA: Diagnosis not present

## 2014-10-26 DIAGNOSIS — K219 Gastro-esophageal reflux disease without esophagitis: Secondary | ICD-10-CM | POA: Diagnosis not present

## 2014-10-26 DIAGNOSIS — R1310 Dysphagia, unspecified: Secondary | ICD-10-CM | POA: Diagnosis not present

## 2014-10-26 DIAGNOSIS — K222 Esophageal obstruction: Secondary | ICD-10-CM | POA: Diagnosis not present

## 2014-10-30 ENCOUNTER — Encounter (HOSPITAL_COMMUNITY): Payer: Self-pay | Admitting: *Deleted

## 2014-10-30 NOTE — Progress Notes (Signed)
10-30-14 1020 Dr. Delma Post, anesthesiologist made aware of Pulmonary hypertension history-pt is stable- seeing cardioolgy yearly-Dr. Ron Parker.

## 2014-11-01 ENCOUNTER — Encounter (HOSPITAL_COMMUNITY): Payer: Self-pay

## 2014-11-01 ENCOUNTER — Ambulatory Visit (HOSPITAL_COMMUNITY)
Admission: RE | Admit: 2014-11-01 | Discharge: 2014-11-01 | Disposition: A | Discharge status: Home / Self Care | Payer: Medicare Other | Source: Ambulatory Visit | Attending: Gastroenterology | Admitting: Gastroenterology

## 2014-11-01 ENCOUNTER — Other Ambulatory Visit: Payer: Self-pay | Admitting: Gastroenterology

## 2014-11-01 ENCOUNTER — Ambulatory Visit (HOSPITAL_COMMUNITY): Payer: Medicare Other | Admitting: Certified Registered Nurse Anesthetist

## 2014-11-01 ENCOUNTER — Encounter (HOSPITAL_COMMUNITY)
Admission: RE | Disposition: A | Discharge status: Home / Self Care | Payer: Self-pay | Source: Ambulatory Visit | Attending: Gastroenterology

## 2014-11-01 ENCOUNTER — Encounter (HOSPITAL_COMMUNITY): Payer: Self-pay | Admitting: Certified Registered Nurse Anesthetist

## 2014-11-01 ENCOUNTER — Ambulatory Visit (HOSPITAL_COMMUNITY): Admit: 2014-11-01 | Payer: Self-pay | Admitting: Gastroenterology

## 2014-11-01 ENCOUNTER — Ambulatory Visit (HOSPITAL_COMMUNITY): Payer: Medicare Other

## 2014-11-01 DIAGNOSIS — Z7951 Long term (current) use of inhaled steroids: Secondary | ICD-10-CM | POA: Diagnosis not present

## 2014-11-01 DIAGNOSIS — K219 Gastro-esophageal reflux disease without esophagitis: Secondary | ICD-10-CM | POA: Insufficient documentation

## 2014-11-01 DIAGNOSIS — Z8673 Personal history of transient ischemic attack (TIA), and cerebral infarction without residual deficits: Secondary | ICD-10-CM | POA: Diagnosis not present

## 2014-11-01 DIAGNOSIS — Z79899 Other long term (current) drug therapy: Secondary | ICD-10-CM | POA: Diagnosis not present

## 2014-11-01 DIAGNOSIS — K222 Esophageal obstruction: Secondary | ICD-10-CM | POA: Insufficient documentation

## 2014-11-01 DIAGNOSIS — Z8542 Personal history of malignant neoplasm of other parts of uterus: Secondary | ICD-10-CM | POA: Insufficient documentation

## 2014-11-01 DIAGNOSIS — I252 Old myocardial infarction: Secondary | ICD-10-CM | POA: Diagnosis not present

## 2014-11-01 DIAGNOSIS — Z7982 Long term (current) use of aspirin: Secondary | ICD-10-CM | POA: Diagnosis not present

## 2014-11-01 DIAGNOSIS — I10 Essential (primary) hypertension: Secondary | ICD-10-CM | POA: Diagnosis not present

## 2014-11-01 DIAGNOSIS — Z9889 Other specified postprocedural states: Secondary | ICD-10-CM | POA: Diagnosis not present

## 2014-11-01 DIAGNOSIS — E780 Pure hypercholesterolemia: Secondary | ICD-10-CM | POA: Insufficient documentation

## 2014-11-01 DIAGNOSIS — I272 Other secondary pulmonary hypertension: Secondary | ICD-10-CM | POA: Diagnosis not present

## 2014-11-01 DIAGNOSIS — J45909 Unspecified asthma, uncomplicated: Secondary | ICD-10-CM | POA: Diagnosis not present

## 2014-11-01 HISTORY — PX: SAVORY DILATION: SHX5439

## 2014-11-01 HISTORY — PX: ESOPHAGOGASTRODUODENOSCOPY (EGD) WITH PROPOFOL: SHX5813

## 2014-11-01 HISTORY — DX: Cerebral infarction, unspecified: I63.9

## 2014-11-01 SURGERY — EGD, WITH DILATION USING SAVARY-GILLIARD DILATOR OVER GUIDEWIRE
Anesthesia: Monitor Anesthesia Care

## 2014-11-01 SURGERY — ESOPHAGOGASTRODUODENOSCOPY (EGD) WITH PROPOFOL
Anesthesia: Monitor Anesthesia Care

## 2014-11-01 MED ORDER — ALBUTEROL SULFATE (2.5 MG/3ML) 0.083% IN NEBU
2.5000 mg | INHALATION_SOLUTION | Freq: Once | RESPIRATORY_TRACT | Status: AC
  Administered 2014-11-01: 2.5 mg via RESPIRATORY_TRACT

## 2014-11-01 MED ORDER — LACTATED RINGERS IV SOLN: INTRAVENOUS | Status: DC

## 2014-11-01 MED ORDER — ALBUTEROL SULFATE (2.5 MG/3ML) 0.083% IN NEBU
INHALATION_SOLUTION | RESPIRATORY_TRACT | Status: AC
  Filled 2014-11-01: qty 3

## 2014-11-01 MED ORDER — PROMETHAZINE HCL 25 MG/ML IJ SOLN: 6.2500 mg | INTRAMUSCULAR | Status: DC | PRN

## 2014-11-01 MED ORDER — EPHEDRINE SULFATE 50 MG/ML IJ SOLN
INTRAMUSCULAR | Status: AC
  Filled 2014-11-01: qty 1

## 2014-11-01 MED ORDER — MEPERIDINE HCL 100 MG/ML IJ SOLN: 6.2500 mg | INTRAMUSCULAR | Status: DC | PRN

## 2014-11-01 MED ORDER — PROPOFOL 10 MG/ML IV BOLUS
INTRAVENOUS | Status: AC
  Filled 2014-11-01: qty 20

## 2014-11-01 MED ORDER — PROPOFOL 10 MG/ML IV BOLUS
INTRAVENOUS | Status: AC
Start: 2014-11-01 — End: 2014-11-01
  Filled 2014-11-01: qty 20

## 2014-11-01 MED ORDER — PROPOFOL 10 MG/ML IV BOLUS
INTRAVENOUS | Status: DC | PRN
  Administered 2014-11-01: 40 mg via INTRAVENOUS
  Administered 2014-11-01: 60 mg via INTRAVENOUS
  Administered 2014-11-01: 20 mg via INTRAVENOUS
  Administered 2014-11-01: 40 mg via INTRAVENOUS
  Administered 2014-11-01: 20 mg via INTRAVENOUS
  Administered 2014-11-01: 40 mg via INTRAVENOUS

## 2014-11-01 MED ORDER — SODIUM CHLORIDE 0.9 % IJ SOLN
INTRAMUSCULAR | Status: AC
  Filled 2014-11-01: qty 10

## 2014-11-01 MED ORDER — LACTATED RINGERS IV SOLN
INTRAVENOUS | Status: DC | PRN
  Administered 2014-11-01: 08:00:00 via INTRAVENOUS

## 2014-11-01 MED ORDER — SODIUM CHLORIDE 0.9 % IV SOLN: INTRAVENOUS | Status: DC

## 2014-11-01 MED ORDER — BUTAMBEN-TETRACAINE-BENZOCAINE 2-2-14 % EX AERO
INHALATION_SPRAY | CUTANEOUS | Status: DC | PRN
  Administered 2014-11-01: 2 via TOPICAL

## 2014-11-01 SURGICAL SUPPLY — 14 items

## 2014-11-01 NOTE — H&P (Signed)
Subjective:   Patient is a 69 y.o. female presents with esophageal stricture. She has had this dilated several times before. She has a history of chronic reflux dilation was attempted last week, partner Dr. Michail Sermon. The procedure was terminated since she had a stricture in the middle of the esophagus in the scope was unable to be passed and there was no fluoroscopy available. She has been continued on PPI therapy. We're going ahead and repeating this with fluoroscopic guidance at the hospital. This is been discussed in the past with the patient by Dr. Michail Sermon and I discussed this all again with the patient today.. Procedure including risks and benefits discussed in office.  Patient Active Problem List   Diagnosis Date Noted  . Ejection fraction   . Aortic valve sclerosis 07/12/2013  . CVA (cerebral vascular accident) 06/13/2013  . Pulmonary HTN   . PVC's (premature ventricular contractions)   . Uterine cancer   . High cholesterol   . Hypertension   . Takotsubo cardiomyopathy   . GERD (gastroesophageal reflux disease) 01/15/2013   Past Medical History  Diagnosis Date  . Hypertension   . Asthma   . Seasonal allergies   . High cholesterol   . GERD (gastroesophageal reflux disease)   . Uterine cancer   . Takotsubo cardiomyopathy     a. NSTEMI 2/2 takotsubo 01/2013, no significant CAD by cath, EF 25%;  b. 01/2013 Echo: EF 25-30%, mod LVH, Sev HK in all segments except base. Mild/mod RV dysfxn, PASP 22mmHg.;  c.  Echo 01/17/13: Moderate LVH, EF 45-50%, diffuse HK, PASP 37  . Pulmonary HTN     a. Mod pulm HTN by cath 01/2013 (PASP 60mmHg).10-30-14 tolerates walking 30 minutes contiuously x 3-4 times weekly, denies SOB or issues.  Marland Kitchen PVC's (premature ventricular contractions)     a. Noted during 01/2013 admission.  . Ejection fraction   . Stroke     "Slurred speech, unsteady gait "01-15-2013- no residual effects.  . Myocardial infarction     01-10-13- [facility],cardiology -yearly visits next 5'2016,  denies any further problems    Past Surgical History  Procedure Laterality Date  . Abdominal hysterectomy  1999  . Left and right heart catheterization with coronary angiogram N/A 01/11/2013    Procedure: LEFT AND RIGHT HEART CATHETERIZATION WITH CORONARY ANGIOGRAM;  Surgeon: Minus Breeding, MD;  Location: [facility] C[facility] LAB;  Service: Cardiovascular;  Laterality: N/A;    Prescriptions prior to admission  Medication Sig Dispense Refill Last Dose  . albuterol (PROVENTIL) (2.5 MG/3ML) 0.083% nebulizer solution Take 2.5 mg by nebulization 2 (two) times daily.    11/01/2014 at [place]  . alendronate (FOSAMAX) 70 MG tablet Take 70 mg by mouth every 7 (seven) days. Take with a full glass of water on an empty stomach.   11/01/2014 at [place]  . amLODipine (NORVASC) 5 MG tablet Take 5 mg by mouth at bedtime.   10/31/2014 at 2230  . aspirin EC 325 MG tablet Take 1 tablet (325 mg total) by mouth daily. 30 tablet 0 10/31/2014 at 0900  . atorvastatin (LIPITOR) 40 MG tablet TAKE 1 TABLET (40 MG TOTAL) BY MOUTH DAILY AT 6 PM. 30 tablet 10 10/31/2014 at 1700  . budesonide-formoterol (SYMBICORT) 160-4.5 MCG/ACT inhaler Inhale 2 puffs into the lungs 2 (two) times daily.   10/31/2014 at 2230  . carvedilol (COREG) 25 MG tablet TAKE 1 TABLET (25 MG TOTAL) BY MOUTH 2 (TWO) TIMES DAILY WITH A MEAL. 60 tablet 6 11/01/2014 at 9am4pm  .  fluticasone (FLONASE) 50 MCG/ACT nasal spray Place 2 sprays into both nostrils as needed for allergies or rhinitis.   11/01/2014 at [place]  . irbesartan (AVAPRO) 300 MG tablet TAKE 1 TABLET (300 MG TOTAL) BY MOUTH DAILY. 30 tablet 5 10/31/2014 at 0900  . loratadine (CLARITIN) 10 MG tablet Take 10 mg by mouth daily.     10/31/2014 at 0900  . montelukast (SINGULAIR) 10 MG tablet Take 10 mg by mouth at bedtime.   10/31/2014 at 2230  . Multiple Vitamin (MULTIVITAMIN WITH MINERALS) TABS Take 1 tablet by mouth daily.   Past Month at Unknown time  . pantoprazole (PROTONIX) 40 MG tablet Take 40 mg by mouth daily.    10/31/2014 at 0900   Allergies  Allergen Reactions  . Cetirizine Hcl Itching  . Plavix [Clopidogrel Bisulfate] Itching  . Xyzal [Cetirizine Hcl] Itching    History  Substance Use Topics  . Smoking status: Never Smoker   . Smokeless tobacco: Never Used  . Alcohol Use: No    Family History  Problem Relation Age of Onset  . Heart attack Mother   . Dementia Father      Objective:   No data found.        See MD Preop evaluation      Assessment:   1. Esophageal stricture and minute esophagus.  Plan:   We will plan on proceeding with EGD and dilatation using fluoroscopic guidance. We will plan on using the pediatric endoscope. Although this is been discussed again with the patient.

## 2014-11-01 NOTE — Anesthesia Preprocedure Evaluation (Signed)
Anesthesia Evaluation  Patient identified by MRN, date of birth, ID band Patient awake    Reviewed: Allergy & Precautions, NPO status , Patient's Chart, lab work & pertinent test results  Airway Mallampati: II  TM Distance: >3 FB Neck ROM: Full    Dental no notable dental hx. (+) Edentulous Upper, Edentulous Lower   Pulmonary asthma ,  breath sounds clear to auscultation  Pulmonary exam normal       Cardiovascular hypertension, Pt. on medications + Past MI Rhythm:Regular Rate:Normal  Takotsubo cardiomyopathy. EF25% Pulm HTN   Neuro/Psych CVA, No Residual Symptoms negative neurological ROS  negative psych ROS   GI/Hepatic negative GI ROS, Neg liver ROS,   Endo/Other  negative endocrine ROS  Renal/GU negative Renal ROS  negative genitourinary   Musculoskeletal negative musculoskeletal ROS (+)   Abdominal   Peds negative pediatric ROS (+)  Hematology negative hematology ROS (+)   Anesthesia Other Findings   Reproductive/Obstetrics negative OB ROS                             Anesthesia Physical Anesthesia Plan  ASA: III  Anesthesia Plan: MAC   Post-op Pain Management:    Induction:   Airway Management Planned: Nasal Cannula  Additional Equipment:   Intra-op Plan:   Post-operative Plan:   Informed Consent: I have reviewed the patients History and Physical, chart, labs and discussed the procedure including the risks, benefits and alternatives for the proposed anesthesia with the patient or authorized representative who has indicated his/her understanding and acceptance.   Dental advisory given  Plan Discussed with: CRNA  Anesthesia Plan Comments:         Anesthesia Quick Evaluation

## 2014-11-01 NOTE — Transfer of Care (Signed)
Immediate Anesthesia Transfer of Care Note  Patient: Deanna Brown  Procedure(s) Performed: Procedure(s): ESOPHAGOGASTRODUODENOSCOPY (EGD) WITH PROPOFOL (N/A) SAVORY DILATION (N/A)  Patient Location: PACU and Endoscopy Unit  Anesthesia Type:MAC  Level of Consciousness: awake, oriented, patient cooperative, lethargic and responds to stimulation  Airway & Oxygen Therapy: Patient Spontanous Breathing and Patient connected to face mask oxygen  Post-op Assessment: Report given to RN, Post -op Vital signs reviewed and stable and Patient moving all extremities  Post vital signs: Reviewed and stable  Last Vitals:  Filed Vitals:   11/01/14 0924  BP: 142/74  Pulse: 72  Temp:   Resp: 22    Complications: No apparent anesthesia complications

## 2014-11-01 NOTE — Anesthesia Postprocedure Evaluation (Signed)
  Anesthesia Post-op Note  Patient: Deanna Brown  Procedure(s) Performed: Procedure(s) (LRB): ESOPHAGOGASTRODUODENOSCOPY (EGD) WITH PROPOFOL (N/A) SAVORY DILATION (N/A)  Patient Location: PACU  Anesthesia Type: MAC  Level of Consciousness: awake and alert   Airway and Oxygen Therapy: Patient Spontanous Breathing  Post-op Pain: mild  Post-op Assessment: Post-op Vital signs reviewed, Patient's Cardiovascular Status Stable, Respiratory Function Stable, Patent Airway and No signs of Nausea or vomiting  Last Vitals:  Filed Vitals:   11/01/14 1010  BP: 168/77  Pulse: 66  Temp:   Resp: 15    Post-op Vital Signs: stable   Complications: No apparent anesthesia complications

## 2014-11-01 NOTE — Addendum Note (Signed)
Addended by: Vernie Ammons. on: 11/01/2014 11:24 AM   Modules accepted: Orders

## 2014-11-01 NOTE — Op Note (Signed)
[facility] [place], [address]   ENDOSCOPY WITH DILATION PROCEDURE REPORT  PATIENT: Deanna Brown, Deanna Brown  MR#: [id number] BIRTHDATE: Nov 10, 1945 , 68  yrs. old GENDER: female ENDOSCOPIST: Laurence Spates, MD ASSISTANT:   Lenor Derrick REFERRED OM:[facility] Criss Rosales, M.D. PROCEDURE DATE:  11/01/2014 PROCEDURE:   EGD w/ wire guided (savary) dilation ASA CLASS:   Class II INDICATIONS:long history of esophageal stricture with previous dilatation attempted 1 week ago but unable to pass scope through stricture in the upper esophagus.Marland Kitchen MEDICATIONS: Propofol 230 TOPICAL ANESTHETIC:   none  DESCRIPTION OF PROCEDURE:   After the risks benefits and alternatives of the procedure were thoroughly explained, informed consent was obtained.  The endoscope [id number]  endoscope was introduced through the mouth  and advanced to the second portion of the duodenum , limited by Without limitations.   The instrument was slowly withdrawn as the mucosa was carefully examined. Estimated blood loss is zero unless otherwise noted in this procedure report.    We passed the pediatric endoscope into the minute esophagus and there was a tight stricture present in the scope would not pass. Despite propofol sedation the patient continued to gag apparently due to seasonal allergies.  We pass the several guidewire using fluoroscopic guidance and this was visualized to pass the diaphragm into the stomach using fluoroscopy.  The scope is withdraw.  With the patient's neck extended I then past 59mm and 9 mm Savary dilator using fluoroscopic guidance with small amount of 15 with both dilators.  At this point the guidewire was withdrawn.  Dilation was then performed at the mid esophagus  Dilator:Savary over guidewire Size:8, 9 mm  COMPLICATIONS: There were no immediate complications.  ENDOSCOPIC IMPRESSION: 1.   We passed the pediatric endoscope into the minute  esophagus and there was a tight stricture present in the scope would not pass. Despite propofol sedation the patient continued to gag apparently due to seasonal allergies.  We pass the several guidewire using fluoroscopic guidance and this was visualized to pass the diaphragm into the stomach using fluoroscopy.  The scope is withdraw.  With the patient's neck extended I then past 35mm and 9 mm Savary dilator using fluoroscopic guidance with small amount of 15 with both dilators.  At this point the guidewire was withdrawn 2.    marked gagging apparently due to seasonal allergies  RECOMMENDATIONS: We will keep her own clear liquids today she will be instructed to call for any problems.  She will remain on protonix.  And will follow-up with Dr.  Michail Sermon in the next several weeks to repeat her dilatation.  This will likely need to be done with propofol and fluoroscopy.  eSigned:  Laurence Spates, MD 11/01/2014 9:17 AM  GE:ZMOQH Criss Rosales, MD  CPT CODES: ICD CODES:  The ICD and CPT codes recommended by this software are interpretations from the data that the clinical staff has captured with the software.  The verification of the translation of this report to the ICD and CPT codes and modifiers is the sole responsibility of the health care institution and practicing physician where this report was generated.  [place]. will not be held responsible for the validity of the ICD and CPT codes included on this report.  AMA assumes no liability for data contained or not contained herein. CPT is a Designer, television/film set of the [employer].  PATIENT NAME:  Deanna Brown, Deanna Brown MR#: [id number]

## 2014-11-01 NOTE — Discharge Instructions (Addendum)
Continue current meds. Clear liquids for 4-6 hours, if no chest pain or trouble breathing, soft foods tonight.  Regular diet tomorrow. Call for problems.      Esophagogastroduodenoscopy Care After Refer to this sheet in the next few weeks. These instructions provide you with information on caring for yourself after your procedure. Your caregiver may also give you more specific instructions. Your treatment has been planned according to current medical practices, but problems sometimes occur. Call your caregiver if you have any problems or questions after your procedure.  HOME CARE INSTRUCTIONS  Do not eat or drink anything until the numbing medicine (local anesthetic) has worn off and your gag reflex has returned. You will know that the local anesthetic has worn off when you can swallow comfortably.  Do not drive for 12 hours after the procedure or as directed by your caregiver.  Only take medicines as directed by your caregiver. SEEK MEDICAL CARE IF:   You cannot stop coughing.  You are not urinating at all or less than usual. SEEK IMMEDIATE MEDICAL CARE IF:  You have difficulty swallowing.  You cannot eat or drink.  You have worsening throat or chest pain.  You have dizziness, lightheadedness, or you faint.  You have nausea or vomiting.  You have chills.  You have a fever.  You have severe abdominal pain.  You have black, tarry, or bloody stools. Document Released: 07/07/2012 Document Reviewed: 07/07/2012 [facility] Patient Information 2015 [place]. This information is not intended to replace advice given to you by your health care provider. Make sure you discuss any questions you have with your health care provider.

## 2014-11-02 ENCOUNTER — Encounter (HOSPITAL_COMMUNITY): Payer: Self-pay | Admitting: Gastroenterology

## 2014-11-27 ENCOUNTER — Other Ambulatory Visit: Payer: Self-pay | Admitting: Gastroenterology

## 2014-11-28 ENCOUNTER — Encounter (HOSPITAL_COMMUNITY): Payer: Self-pay | Admitting: *Deleted

## 2014-11-28 NOTE — Addendum Note (Signed)
Addended by: Wilford Corner on: 11/28/2014 10:49 AM   Modules accepted: Orders

## 2014-11-30 ENCOUNTER — Encounter (HOSPITAL_COMMUNITY)
Admission: RE | Disposition: A | Discharge status: Home / Self Care | Payer: Self-pay | Source: Ambulatory Visit | Attending: Gastroenterology

## 2014-11-30 ENCOUNTER — Ambulatory Visit (HOSPITAL_COMMUNITY): Payer: Medicare Other

## 2014-11-30 ENCOUNTER — Encounter (HOSPITAL_COMMUNITY): Payer: Self-pay

## 2014-11-30 ENCOUNTER — Ambulatory Visit (HOSPITAL_COMMUNITY): Payer: Medicare Other | Admitting: Anesthesiology

## 2014-11-30 ENCOUNTER — Ambulatory Visit (HOSPITAL_COMMUNITY)
Admission: RE | Admit: 2014-11-30 | Discharge: 2014-11-30 | Disposition: A | Discharge status: Home / Self Care | Payer: Medicare Other | Source: Ambulatory Visit | Attending: Gastroenterology | Admitting: Gastroenterology

## 2014-11-30 DIAGNOSIS — I10 Essential (primary) hypertension: Secondary | ICD-10-CM | POA: Insufficient documentation

## 2014-11-30 DIAGNOSIS — K449 Diaphragmatic hernia without obstruction or gangrene: Secondary | ICD-10-CM | POA: Diagnosis not present

## 2014-11-30 DIAGNOSIS — R1310 Dysphagia, unspecified: Secondary | ICD-10-CM

## 2014-11-30 DIAGNOSIS — K222 Esophageal obstruction: Secondary | ICD-10-CM

## 2014-11-30 DIAGNOSIS — Z79899 Other long term (current) drug therapy: Secondary | ICD-10-CM | POA: Diagnosis not present

## 2014-11-30 DIAGNOSIS — K219 Gastro-esophageal reflux disease without esophagitis: Secondary | ICD-10-CM | POA: Diagnosis not present

## 2014-11-30 DIAGNOSIS — Z7951 Long term (current) use of inhaled steroids: Secondary | ICD-10-CM | POA: Insufficient documentation

## 2014-11-30 DIAGNOSIS — I252 Old myocardial infarction: Secondary | ICD-10-CM | POA: Insufficient documentation

## 2014-11-30 DIAGNOSIS — I639 Cerebral infarction, unspecified: Secondary | ICD-10-CM | POA: Insufficient documentation

## 2014-11-30 DIAGNOSIS — J449 Chronic obstructive pulmonary disease, unspecified: Secondary | ICD-10-CM | POA: Insufficient documentation

## 2014-11-30 HISTORY — PX: ESOPHAGOGASTRODUODENOSCOPY (EGD) WITH PROPOFOL: SHX5813

## 2014-11-30 HISTORY — PX: SAVORY DILATION: SHX5439

## 2014-11-30 HISTORY — PX: BALLOON DILATION: SHX5330

## 2014-11-30 HISTORY — DX: Other allergy status, other than to drugs and biological substances: Z91.09

## 2014-11-30 SURGERY — ESOPHAGOGASTRODUODENOSCOPY (EGD) WITH PROPOFOL
Anesthesia: Monitor Anesthesia Care

## 2014-11-30 MED ORDER — MIDAZOLAM HCL 5 MG/5ML IJ SOLN
INTRAMUSCULAR | Status: DC | PRN
  Administered 2014-11-30 (×2): 1 mg via INTRAVENOUS

## 2014-11-30 MED ORDER — SODIUM CHLORIDE 0.9 % IV SOLN: INTRAVENOUS | Status: DC

## 2014-11-30 MED ORDER — FENTANYL CITRATE (PF) 100 MCG/2ML IJ SOLN
INTRAMUSCULAR | Status: DC | PRN
  Administered 2014-11-30: 50 ug via INTRAVENOUS

## 2014-11-30 MED ORDER — PROPOFOL INFUSION 10 MG/ML OPTIME
INTRAVENOUS | Status: DC | PRN
  Administered 2014-11-30: 100 ug/kg/min via INTRAVENOUS

## 2014-11-30 MED ORDER — LACTATED RINGERS IV SOLN
INTRAVENOUS | Status: DC | PRN
  Administered 2014-11-30 (×2): via INTRAVENOUS

## 2014-11-30 MED ORDER — PROPOFOL 10 MG/ML IV BOLUS
INTRAVENOUS | Status: DC | PRN
  Administered 2014-11-30: 20 mg via INTRAVENOUS

## 2014-11-30 NOTE — H&P (Signed)
  Date of Initial H&P: 11/17/14  History reviewed, patient examined, no change in status, stable for surgery.

## 2014-11-30 NOTE — Discharge Instructions (Signed)
Esophagogastroduodenoscopy °Care After °Refer to this sheet in the next few weeks. These instructions provide you with information on caring for yourself after your procedure. Your caregiver may also give you more specific instructions. Your treatment has been planned according to current medical practices, but problems sometimes occur. Call your caregiver if you have any problems or questions after your procedure.  °HOME CARE INSTRUCTIONS °· Do not eat or drink anything until the numbing medicine (local anesthetic) has worn off and your gag reflex has returned. You will know that the local anesthetic has worn off when you can swallow comfortably. °· Do not drive for 12 hours after the procedure or as directed by your caregiver. °· Only take medicines as directed by your caregiver. °SEEK MEDICAL CARE IF:  °· You cannot stop coughing. °· You are not urinating at all or less than usual. °SEEK IMMEDIATE MEDICAL CARE IF: °· You have difficulty swallowing. °· You cannot eat or drink. °· You have worsening throat or chest pain. °· You have dizziness, lightheadedness, or you faint. °· You have nausea or vomiting. °· You have chills. °· You have a fever. °· You have severe abdominal pain. °· You have black, tarry, or bloody stools. °Document Released: 07/07/2012 Document Reviewed: 07/07/2012 °Exit[employer]® Patient Information ©2015 [employer]. This information is not intended to replace advice given to you by your health care provider. Make sure you discuss any questions you have with your health care provider. ° °

## 2014-11-30 NOTE — Op Note (Signed)
[facility] [place], [address]   ENDOSCOPY PROCEDURE REPORT  PATIENT: Deanna Brown, Deanna Brown  MR#: [id number] BIRTHDATE: [birth year] , 68  yrs. old GENDER: female ENDOSCOPIST: Wilford Corner, MD REFERRED BY: PROCEDURE DATE:  11/30/2014 PROCEDURE:  EGD w/ wire guided (Savary) dilation ASA CLASS:     Class III INDICATIONS:  dysphagia and esophageal stricture. MEDICATIONS: Monitored anesthesia care and Per Anesthesia TOPICAL ANESTHETIC: none  DESCRIPTION OF PROCEDURE: After the risks benefits and alternatives of the procedure were thoroughly explained, informed consent was obtained.  The PENTAX GASTOROSCOPE [id number] endoscope was introduced through the mouth and advanced to the second portion of the duodenum , Without limitations.  The instrument was slowly withdrawn as the mucosa was fully examined. Estimated blood loss is zero unless otherwise noted in this procedure report.    Tight mid-esophageal stricture noted at 18 cm from the incisors and a pediatric endoscope (8 mm diameter) could not traverse the stricture. Using fluoroscopy a spring-tipped guidewire was advanced through the scope into the stomach and the endoscope was removed. A 10 mm Savary dilator was passed over the wire without resistance and no heme. A 11 mm dilator passed over the wire with minimal resistance and minimal heme. A 12.8 mm dilator passed over the wire with mild resistance and heme noted. A 14 mm dilator passed over the wire with moderate resistance and heme noted. The 14 mm dilator and wire were then removed together. The pediatric endoscope was then reinserted and the stricture was successfully dilated and was noted to extend from 18 cm  to approximately 24 cm from the incisors. The GEJ was 34 cm from the incisors. A medium-sized hiatal hernia was noted. Stomach otherwise normal. Duodenal bulb and 2nd portion of the duodenum were normal.        Retroflexed views revealed a medium-sized hiatal hernia.     The scope was then withdrawn from the patient and the procedure completed.  COMPLICATIONS: There were no immediate complications.  ENDOSCOPIC IMPRESSION:     Tight mid-esophageal stricture dilated to 14 mm (Savary) - see above Medium-sized hiatal hernia  RECOMMENDATIONS:     PPI QD; Liquids and then soft solids and advance further as tolerated; F/U in office in 6 weeks   eSigned:  Wilford Corner, MD 11/30/2014 2:00 PM    YJ:EHUDJ Criss Rosales, MD  CPT CODES: ICD CODES:  The ICD and CPT codes recommended by this software are interpretations from the data that the clinical staff has captured with the software.  The verification of the translation of this report to the ICD and CPT codes and modifiers is the sole responsibility of the health care institution and practicing physician where this report was generated.  [place]. will not be held responsible for the validity of the ICD and CPT codes included on this report.  AMA assumes no liability for data contained or not contained herein. CPT is a Designer, television/film set of the [employer].  PATIENT NAME:  Deanna Brown, Deanna Brown MR#: [id number]

## 2014-11-30 NOTE — Interval H&P Note (Signed)
History and Physical Interval Note:  11/30/2014 12:46 PM  Deanna Brown  has presented today for surgery, with the diagnosis of esoph.stricture/dysphagia  The various methods of treatment have been discussed with the patient and family. After consideration of risks, benefits and other options for treatment, the patient has consented to  Procedure(s): ESOPHAGOGASTRODUODENOSCOPY (EGD) WITH PROPOFOL (N/A) SAVORY DILATION (N/A) BALLOON DILATION (N/A) as a surgical intervention .  The patient's history has been reviewed, patient examined, no change in status, stable for surgery.  I have reviewed the patient's chart and labs.  Questions were answered to the patient's satisfaction.     [place] C.

## 2014-11-30 NOTE — Anesthesia Postprocedure Evaluation (Signed)
  Anesthesia Post-op Note  Patient: Deanna Brown  Procedure(s) Performed: Procedure(s): ESOPHAGOGASTRODUODENOSCOPY (EGD) WITH PROPOFOL (N/A) SAVORY DILATION (N/A) BALLOON DILATION (N/A)  Patient Location: Endoscopy Unit  Anesthesia Type:MAC  Level of Consciousness: awake and alert   Airway and Oxygen Therapy: Patient Spontanous Breathing  Post-op Pain: none  Post-op Assessment: Post-op Vital signs reviewed  Post-op Vital Signs: stable  Last Vitals:  Filed Vitals:   11/30/14 1354  BP: 166/95  Pulse: 84  Temp: 36.2 C  Resp: 21    Complications: No apparent anesthesia complications

## 2014-11-30 NOTE — Anesthesia Procedure Notes (Signed)
Procedure Name: MAC Date/Time: 11/30/2014 1:08 PM Performed by: Kyung Rudd Pre-anesthesia Checklist: Patient identified, Emergency Drugs available, Suction available, Patient being monitored and Timeout performed Patient Re-evaluated:Patient Re-evaluated prior to inductionOxygen Delivery Method: Nasal cannula Intubation Type: IV induction Placement Confirmation: positive ETCO2

## 2014-11-30 NOTE — Transfer of Care (Signed)
Immediate Anesthesia Transfer of Care Note  Patient: [name]  Procedure(s) Performed: Procedure(s): ESOPHAGOGASTRODUODENOSCOPY (EGD) WITH PROPOFOL (N/A) SAVORY DILATION (N/A) BALLOON DILATION (N/A)  Patient Location: Endoscopy Unit  Anesthesia Type:MAC  Level of Consciousness: awake, alert  and oriented  Airway & Oxygen Therapy: Patient Spontanous Breathing and Patient connected to nasal cannula oxygen  Post-op Assessment: Report given to RN, Post -op Vital signs reviewed and stable and Patient moving all extremities  Post vital signs: Reviewed and stable  Last Vitals:  Filed Vitals:   11/30/14 1122  BP: 140/85  Pulse: 77  Temp: 36.6 C  Resp: 14    Complications: No apparent anesthesia complications

## 2014-11-30 NOTE — Anesthesia Preprocedure Evaluation (Addendum)
Anesthesia Evaluation  Patient identified by MRN, date of birth, ID band Patient awake    Reviewed: Allergy & Precautions, NPO status , Patient's Chart, lab work & pertinent test results  Airway Mallampati: I       Dental  (+) Edentulous Upper, Edentulous Lower   Pulmonary  breath sounds clear to auscultation        Cardiovascular hypertension, + Past MI Rhythm:Regular Rate:Normal     Neuro/Psych CVA    GI/Hepatic Neg liver ROS, GERD-  ,  Endo/Other  negative endocrine ROS  Renal/GU negative Renal ROS     Musculoskeletal   Abdominal   Peds  Hematology   Anesthesia Other Findings   Reproductive/Obstetrics                            Anesthesia Physical Anesthesia Plan  ASA: III  Anesthesia Plan: MAC   Post-op Pain Management:    Induction: Intravenous  Airway Management Planned: Natural Airway and Nasal Cannula  Additional Equipment:   Intra-op Plan:   Post-operative Plan:   Informed Consent: I have reviewed the patients History and Physical, chart, labs and discussed the procedure including the risks, benefits and alternatives for the proposed anesthesia with the patient or authorized representative who has indicated his/her understanding and acceptance.   Dental advisory given  Plan Discussed with: CRNA and Surgeon  Anesthesia Plan Comments:         Anesthesia Quick Evaluation

## 2014-12-01 ENCOUNTER — Encounter (HOSPITAL_COMMUNITY): Payer: Self-pay | Admitting: Gastroenterology

## 2014-12-11 DIAGNOSIS — J4550 Severe persistent asthma, uncomplicated: Secondary | ICD-10-CM | POA: Diagnosis not present

## 2014-12-11 DIAGNOSIS — J309 Allergic rhinitis, unspecified: Secondary | ICD-10-CM | POA: Diagnosis not present

## 2014-12-30 ENCOUNTER — Other Ambulatory Visit: Payer: Self-pay | Admitting: Cardiology

## 2015-01-02 ENCOUNTER — Ambulatory Visit: Payer: Medicare Other | Admitting: Neurology

## 2015-01-09 ENCOUNTER — Ambulatory Visit: Payer: Medicare Other | Admitting: Neurology

## 2015-01-10 ENCOUNTER — Other Ambulatory Visit: Payer: Self-pay | Admitting: Cardiology

## 2015-01-14 ENCOUNTER — Encounter (HOSPITAL_COMMUNITY): Payer: Self-pay

## 2015-01-14 ENCOUNTER — Emergency Department (HOSPITAL_COMMUNITY)
Admission: EM | Admit: 2015-01-14 | Discharge: 2015-01-14 | Disposition: A | Discharge status: Home / Self Care | Payer: Medicare Other | Attending: Emergency Medicine | Admitting: Emergency Medicine

## 2015-01-14 ENCOUNTER — Emergency Department (HOSPITAL_COMMUNITY): Payer: Medicare Other

## 2015-01-14 DIAGNOSIS — Z8673 Personal history of transient ischemic attack (TIA), and cerebral infarction without residual deficits: Secondary | ICD-10-CM | POA: Insufficient documentation

## 2015-01-14 DIAGNOSIS — J4521 Mild intermittent asthma with (acute) exacerbation: Secondary | ICD-10-CM | POA: Insufficient documentation

## 2015-01-14 DIAGNOSIS — R0602 Shortness of breath: Secondary | ICD-10-CM

## 2015-01-14 DIAGNOSIS — Z8541 Personal history of malignant neoplasm of cervix uteri: Secondary | ICD-10-CM | POA: Insufficient documentation

## 2015-01-14 DIAGNOSIS — E780 Pure hypercholesterolemia: Secondary | ICD-10-CM | POA: Diagnosis not present

## 2015-01-14 DIAGNOSIS — I10 Essential (primary) hypertension: Secondary | ICD-10-CM | POA: Insufficient documentation

## 2015-01-14 DIAGNOSIS — Z9889 Other specified postprocedural states: Secondary | ICD-10-CM | POA: Insufficient documentation

## 2015-01-14 DIAGNOSIS — Z7982 Long term (current) use of aspirin: Secondary | ICD-10-CM | POA: Diagnosis not present

## 2015-01-14 DIAGNOSIS — Z79899 Other long term (current) drug therapy: Secondary | ICD-10-CM | POA: Diagnosis not present

## 2015-01-14 DIAGNOSIS — I252 Old myocardial infarction: Secondary | ICD-10-CM | POA: Insufficient documentation

## 2015-01-14 DIAGNOSIS — K219 Gastro-esophageal reflux disease without esophagitis: Secondary | ICD-10-CM | POA: Diagnosis not present

## 2015-01-14 DIAGNOSIS — R05 Cough: Secondary | ICD-10-CM | POA: Diagnosis not present

## 2015-01-14 DIAGNOSIS — Z7951 Long term (current) use of inhaled steroids: Secondary | ICD-10-CM | POA: Insufficient documentation

## 2015-01-14 DIAGNOSIS — J449 Chronic obstructive pulmonary disease, unspecified: Secondary | ICD-10-CM | POA: Diagnosis not present

## 2015-01-14 MED ORDER — PREDNISONE 10 MG PO TABS: 50.0000 mg | ORAL_TABLET | Freq: Every day | ORAL | Status: DC

## 2015-01-14 MED ORDER — IPRATROPIUM-ALBUTEROL 0.5-2.5 (3) MG/3ML IN SOLN
3.0000 mL | Freq: Once | RESPIRATORY_TRACT | Status: AC
  Administered 2015-01-14: 3 mL via RESPIRATORY_TRACT
  Filled 2015-01-14: qty 3

## 2015-01-14 MED ORDER — PREDNISONE 20 MG PO TABS
60.0000 mg | ORAL_TABLET | Freq: Once | ORAL | Status: AC
  Administered 2015-01-14: 60 mg via ORAL
  Filled 2015-01-14: qty 3

## 2015-01-14 MED ORDER — ALBUTEROL SULFATE HFA 108 (90 BASE) MCG/ACT IN AERS
4.0000 | INHALATION_SPRAY | Freq: Once | RESPIRATORY_TRACT | Status: AC
  Administered 2015-01-14: 4 via RESPIRATORY_TRACT
  Filled 2015-01-14: qty 6.7

## 2015-01-14 NOTE — ED Provider Notes (Signed)
CSN: [id number]     Arrival date & time 01/14/15  1826 History   First MD Initiated Contact with Patient 01/14/15 1917     Chief Complaint  Patient presents with  . Asthma     (Consider location/radiation/quality/duration/timing/severity/associated sxs/prior Treatment) Patient is a 69 y.o. female presenting with asthma.  Asthma This is a new problem. The current episode started in the past 7 days (2 days). The problem occurs constantly. The problem has been gradually worsening. Associated symptoms include coughing. Pertinent negatives include no abdominal pain, fever or vomiting. Nothing aggravates the symptoms. She has tried nothing for the symptoms. The treatment provided no relief.    Past Medical History  Diagnosis Date  . Hypertension   . Asthma   . Seasonal allergies   . High cholesterol   . GERD (gastroesophageal reflux disease)   . Uterine cancer   . Takotsubo cardiomyopathy     a. NSTEMI 2/2 takotsubo 01/2013, no significant CAD by cath, EF 25%;  b. 01/2013 Echo: EF 25-30%, mod LVH, Sev HK in all segments except base. Mild/mod RV dysfxn, PASP 50mmHg.;  c.  Echo 01/17/13: Moderate LVH, EF 45-50%, diffuse HK, PASP 37  . Pulmonary HTN     a. Mod pulm HTN by cath 01/2013 (PASP 52mmHg).10-30-14 tolerates walking 30 minutes contiuously x 3-4 times weekly, denies SOB or issues.  Marland Kitchen PVC's (premature ventricular contractions)     a. Noted during 01/2013 admission.  . Ejection fraction   . Myocardial infarction     01-10-13- [facility],cardiology -yearly visits next 5'2016, denies any further problems  . Stroke     "Slurred speech, unsteady gait "01-15-2013- no residual effects.  . Environmental allergies    Past Surgical History  Procedure Laterality Date  . Abdominal hysterectomy  1999  . Left and right heart catheterization with coronary angiogram N/A 01/11/2013    Procedure: LEFT AND RIGHT HEART CATHETERIZATION WITH CORONARY ANGIOGRAM;  Surgeon: Minus Breeding, MD;  Location: [facility] CATH LAB;   Service: Cardiovascular;  Laterality: N/A;  . Esophagogastroduodenoscopy (egd) with propofol N/A 11/01/2014    Procedure: ESOPHAGOGASTRODUODENOSCOPY (EGD) WITH PROPOFOL;  Surgeon: Laurence Spates, MD;  Location: [facility] ENDOSCOPY;  Service: Endoscopy;  Laterality: N/A;  . Savory dilation N/A 11/01/2014    Procedure: SAVORY DILATION;  Surgeon: Laurence Spates, MD;  Location: [facility] ENDOSCOPY;  Service: Endoscopy;  Laterality: N/A;  . Esophagogastroduodenoscopy (egd) with propofol N/A 11/30/2014    Procedure: ESOPHAGOGASTRODUODENOSCOPY (EGD) WITH PROPOFOL;  Surgeon: Wilford Corner, MD;  Location: [facility] ENDOSCOPY;  Service: Endoscopy;  Laterality: N/A;  . Savory dilation N/A 11/30/2014    Procedure: SAVORY DILATION;  Surgeon: Wilford Corner, MD;  Location: [facility] ENDOSCOPY;  Service: Endoscopy;  Laterality: N/A;  . Balloon dilation N/A 11/30/2014    Procedure: BALLOON DILATION;  Surgeon: Wilford Corner, MD;  Location: [facility] ENDOSCOPY;  Service: Endoscopy;  Laterality: N/A;   Family History  Problem Relation Age of Onset  . Heart attack Mother   . Dementia Father    History  Substance Use Topics  . Smoking status: Never Smoker   . Smokeless tobacco: Never Used  . Alcohol Use: No   OB History    No data available     Review of Systems  Constitutional: Negative for fever.  Respiratory: Positive for cough.   Gastrointestinal: Negative for vomiting and abdominal pain.  All other systems reviewed and are negative.     Allergies  Guaifenesin & derivatives; Cetirizine hcl; Plavix; and Xyzal  Home Medications   Prior  to Admission medications   Medication Sig Start Date End Date Taking? Authorizing Provider  albuterol (PROVENTIL) (2.5 MG/3ML) 0.083% nebulizer solution Take 2.5 mg by nebulization 2 (two) times daily.     Historical Provider, MD  alendronate (FOSAMAX) 70 MG tablet Take 70 mg by mouth every 7 (seven) days. Take with a full glass of water on an empty stomach.    Historical Provider, MD   amLODipine (NORVASC) 5 MG tablet Take 5 mg by mouth at bedtime.    Historical Provider, MD  aspirin EC 325 MG tablet Take 1 tablet (325 mg total) by mouth daily. 02/17/13   Liliane Shi, PA-C  atorvastatin (LIPITOR) 40 MG tablet TAKE 1 TABLET (40 MG TOTAL) BY MOUTH DAILY AT 6 PM. 01/10/15   Carlena Bjornstad, MD  budesonide-formoterol [facility]) 160-4.5 MCG/ACT inhaler Inhale 2 puffs into the lungs 2 (two) times daily.    Historical Provider, MD  carvedilol (COREG) 25 MG tablet TAKE 1 TABLET (25 MG TOTAL) BY MOUTH 2 (TWO) TIMES DAILY WITH A MEAL. 07/08/14   Carlena Bjornstad, MD  fluticasone (FLONASE) 50 MCG/ACT nasal spray Place 2 sprays into both nostrils as needed for allergies or rhinitis.    Historical Provider, MD  irbesartan (AVAPRO) 300 MG tablet TAKE 1 TABLET (300 MG TOTAL) BY MOUTH DAILY. 01/02/15   Carlena Bjornstad, MD  loratadine (CLARITIN) 10 MG tablet Take 10 mg by mouth daily.      Historical Provider, MD  montelukast (SINGULAIR) 10 MG tablet Take 10 mg by mouth at bedtime.    Historical Provider, MD  pantoprazole (PROTONIX) 40 MG tablet Take 40 mg by mouth daily.    Historical Provider, MD  predniSONE (DELTASONE) 10 MG tablet Take 5 tablets (50 mg total) by mouth daily. 01/15/15   Leo Grosser, MD   BP 140/83 mmHg  Pulse 80  Temp(Src) 97.9 F (36.6 C) (Oral)  Resp 20  Ht 5' 1.5" (1.562 m)  Wt 114 lb (51.71 kg)  BMI 21.19 kg/m2  SpO2 92% Physical Exam  Constitutional: She is oriented to person, place, and time. She appears well-developed and well-nourished. No distress.  HENT:  Head: Normocephalic.  Eyes: Conjunctivae are normal.  Neck: Neck supple. No tracheal deviation present.  Cardiovascular: Normal rate, regular rhythm and normal heart sounds.   Pulmonary/Chest: Effort normal. No respiratory distress. She has wheezes (bilateral expiratory). She has no rales. She exhibits no tenderness.  Abdominal: Soft. She exhibits no distension. There is no tenderness.  Neurological: She is  alert and oriented to person, place, and time.  Skin: Skin is warm and dry.  Psychiatric: She has a normal mood and affect.    ED Course  Procedures (including critical care time) Labs Review Labs Reviewed - No data to display  Imaging Review Dg Chest 2 View  01/14/2015   CLINICAL DATA:  Shortness of breath, cough for 1 day  EXAM: CHEST  2 VIEW  COMPARISON:  08/08/2013  FINDINGS: The lungs are hyperinflated likely secondary to COPD. There is no focal parenchymal opacity. There is no pleural effusion or pneumothorax. The heart and mediastinal contours are unremarkable.  The osseous structures are unremarkable.  IMPRESSION: No active cardiopulmonary disease.   Electronically Signed   By: Kathreen Devoid   On: 01/14/2015 20:28  I independently viewed and interpreted the above radiology studies and agree with radiologist report.    EKG Interpretation None      MDM   Final diagnoses:  Shortness of breath  Asthma exacerbation attacks, mild intermittent   69 year old female presents with symptoms typical of an asthma exacerbation that started 2 days ago. She is on Symbicort and albuterol at home and has had breakthrough wheezing. She is otherwise well-appearing, has no fevers, has had a prior MI and hypertension but is not hypertensive here. No evidence of pulmonary edema or infection on x-ray. She'll be started on a steroid burst and scheduled albuterol therapy with a spacer. Plan to follow up with PCP as needed and return precautions discussed for worsening or new concerning symptoms.   Leo Grosser, MD 55/37/48 [id number]  Delora Fuel, MD 86/75/44 [phone]

## 2015-01-14 NOTE — ED Notes (Signed)
Pt c/o asthma with productive cough x 3 days. Wheezing noted. Reports improvement after breathing treatment in triage. Denies pain

## 2015-01-14 NOTE — ED Notes (Signed)
Dr. Glick at bedside.  

## 2015-01-14 NOTE — Discharge Instructions (Signed)

## 2015-01-14 NOTE — ED Notes (Signed)
Pt reports chronic cough, onset 2 days ago wheezing, shortness of breath.  Last neb treatment 1 hour PTA.  Pt able to talk in complete sentences.  Today shortness of breath at rest.

## 2015-01-29 DIAGNOSIS — I119 Hypertensive heart disease without heart failure: Secondary | ICD-10-CM | POA: Diagnosis not present

## 2015-01-29 DIAGNOSIS — I6789 Other cerebrovascular disease: Secondary | ICD-10-CM | POA: Diagnosis not present

## 2015-01-29 DIAGNOSIS — R7309 Other abnormal glucose: Secondary | ICD-10-CM | POA: Diagnosis not present

## 2015-01-29 DIAGNOSIS — J441 Chronic obstructive pulmonary disease with (acute) exacerbation: Secondary | ICD-10-CM | POA: Diagnosis not present

## 2015-01-29 DIAGNOSIS — J45909 Unspecified asthma, uncomplicated: Secondary | ICD-10-CM | POA: Diagnosis not present

## 2015-01-29 DIAGNOSIS — E0800 Diabetes mellitus due to underlying condition with hyperosmolarity without nonketotic hyperglycemic-hyperosmolar coma (NKHHC): Secondary | ICD-10-CM | POA: Diagnosis not present

## 2015-01-30 DIAGNOSIS — H34831 Tributary (branch) retinal vein occlusion, right eye: Secondary | ICD-10-CM | POA: Diagnosis not present

## 2015-01-30 DIAGNOSIS — H35371 Puckering of macula, right eye: Secondary | ICD-10-CM | POA: Diagnosis not present

## 2015-02-07 ENCOUNTER — Ambulatory Visit (INDEPENDENT_AMBULATORY_CARE_PROVIDER_SITE_OTHER): Payer: Medicare Other | Admitting: Cardiology

## 2015-02-07 ENCOUNTER — Encounter: Payer: Self-pay | Admitting: Cardiology

## 2015-02-07 ENCOUNTER — Other Ambulatory Visit: Payer: Self-pay | Admitting: Cardiology

## 2015-02-07 VITALS — BP 128/82 | HR 68 | Ht 61.0 in | Wt 116.4 lb

## 2015-02-07 DIAGNOSIS — I5181 Takotsubo syndrome: Secondary | ICD-10-CM

## 2015-02-07 DIAGNOSIS — I10 Essential (primary) hypertension: Secondary | ICD-10-CM

## 2015-02-07 DIAGNOSIS — I639 Cerebral infarction, unspecified: Secondary | ICD-10-CM | POA: Diagnosis not present

## 2015-02-07 MED ORDER — ASPIRIN 81 MG PO TBEC: 81.0000 mg | DELAYED_RELEASE_TABLET | Freq: Every day | ORAL | Status: DC

## 2015-02-07 NOTE — Progress Notes (Signed)
Cardiology Office Note   Date:  02/07/2015   ID:  Deanna Brown, DOB 23-Jun-1946, MRN [id number]  PCP:  Elyn Peers, MD  Cardiologist:  Dola Argyle, MD   Chief Complaint  Patient presents with  . Appointment    Follow-up history of Takotsubo event in the past.      History of Present Illness: Deanna Brown is a 69 y.o. female who presents today to follow-up a history in the past of a Takotsubo event. This occurred in the past and she had complete recovery of left ventricular function. She has been stable since then. She actually had a neurologic event that occurred within a few weeks of her cardiac event in the past. She has been on aspirin for this. She is fully functional. She is not having any significant problems.  The patient was seen in the emergency room in the last month for an asthma episode. She responded well and went home.    Past Medical History  Diagnosis Date  . Hypertension   . Asthma   . Seasonal allergies   . High cholesterol   . GERD (gastroesophageal reflux disease)   . Uterine cancer   . Takotsubo cardiomyopathy     a. NSTEMI 2/2 takotsubo 01/2013, no significant CAD by cath, EF 25%;  b. 01/2013 Echo: EF 25-30%, mod LVH, Sev HK in all segments except base. Mild/mod RV dysfxn, PASP 19mmHg.;  c.  Echo 01/17/13: Moderate LVH, EF 45-50%, diffuse HK, PASP 37  . Pulmonary HTN     a. Mod pulm HTN by cath 01/2013 (PASP 52mmHg).10-30-14 tolerates walking 30 minutes contiuously x 3-4 times weekly, denies SOB or issues.  Marland Kitchen PVC's (premature ventricular contractions)     a. Noted during 01/2013 admission.  . Ejection fraction   . Myocardial infarction     01-10-13- Deanna Brown,cardiology -yearly visits next 5'2016, denies any further problems  . Stroke     "Slurred speech, unsteady gait "01-15-2013- no residual effects.  . Environmental allergies     Past Surgical History  Procedure Laterality Date  . Abdominal hysterectomy  1999  . Left and right heart  catheterization with coronary angiogram N/A 01/11/2013    Procedure: LEFT AND RIGHT HEART CATHETERIZATION WITH CORONARY ANGIOGRAM;  Surgeon: Minus Breeding, MD;  Location: [facility] CATH LAB;  Service: Cardiovascular;  Laterality: N/A;  . Esophagogastroduodenoscopy (egd) with propofol N/A 11/01/2014    Procedure: ESOPHAGOGASTRODUODENOSCOPY (EGD) WITH PROPOFOL;  Surgeon: Laurence Spates, MD;  Location: [facility] ENDOSCOPY;  Service: Endoscopy;  Laterality: N/A;  . Savory dilation N/A 11/01/2014    Procedure: SAVORY DILATION;  Surgeon: Laurence Spates, MD;  Location: [facility] ENDOSCOPY;  Service: Endoscopy;  Laterality: N/A;  . Esophagogastroduodenoscopy (egd) with propofol N/A 11/30/2014    Procedure: ESOPHAGOGASTRODUODENOSCOPY (EGD) WITH PROPOFOL;  Surgeon: Wilford Corner, MD;  Location: [facility] ENDOSCOPY;  Service: Endoscopy;  Laterality: N/A;  . Savory dilation N/A 11/30/2014    Procedure: SAVORY DILATION;  Surgeon: Wilford Corner, MD;  Location: [facility] ENDOSCOPY;  Service: Endoscopy;  Laterality: N/A;  . Balloon dilation N/A 11/30/2014    Procedure: BALLOON DILATION;  Surgeon: Wilford Corner, MD;  Location: [facility] ENDOSCOPY;  Service: Endoscopy;  Laterality: N/A;    Patient Active Problem List   Diagnosis Date Noted  . Stricture and stenosis of esophagus 11/30/2014  . Difficulty in swallowing 11/30/2014  . Ejection fraction   . Aortic valve sclerosis 07/12/2013  . CVA (cerebral vascular accident) 06/13/2013  . Pulmonary HTN   . PVC's (premature ventricular contractions)   .  Uterine cancer   . High cholesterol   . Hypertension   . Takotsubo cardiomyopathy   . GERD (gastroesophageal reflux disease) 01/15/2013      Current Outpatient Prescriptions  Medication Sig Dispense Refill  . albuterol (PROVENTIL) (2.5 MG/3ML) 0.083% nebulizer solution Take 2.5 mg by nebulization 2 (two) times daily.     Marland Kitchen alendronate (FOSAMAX) 70 MG tablet Take 70 mg by mouth every 7 (seven) days. Take with a full glass of water on an empty  stomach.    Marland Kitchen amLODipine (NORVASC) 5 MG tablet Take 5 mg by mouth at bedtime.    Marland Kitchen aspirin EC 325 MG tablet Take 1 tablet (325 mg total) by mouth daily. 30 tablet 0  . atorvastatin (LIPITOR) 40 MG tablet TAKE 1 TABLET (40 MG TOTAL) BY MOUTH DAILY AT 6 PM. 30 tablet 0  . budesonide-formoterol (SYMBICORT) 160-4.5 MCG/ACT inhaler Inhale 2 puffs into the lungs 2 (two) times daily.    . carvedilol (COREG) 25 MG tablet TAKE 1 TABLET (25 MG TOTAL) BY MOUTH 2 (TWO) TIMES DAILY WITH A MEAL. 60 tablet 6  . fluticasone (FLONASE) 50 MCG/ACT nasal spray Place 2 sprays into both nostrils as needed for allergies or rhinitis.    Marland Kitchen irbesartan (AVAPRO) 300 MG tablet TAKE 1 TABLET (300 MG TOTAL) BY MOUTH DAILY. 30 tablet 1  . loratadine (CLARITIN) 10 MG tablet Take 10 mg by mouth daily.      . montelukast (SINGULAIR) 10 MG tablet Take 10 mg by mouth at bedtime.    . pantoprazole (PROTONIX) 40 MG tablet Take 40 mg by mouth daily.    Marland Kitchen SPIRIVA HANDIHALER 18 MCG inhalation capsule Place 1 puff into inhaler and inhale every morning.  3   No current facility-administered medications for this visit.    Allergies:   Guaifenesin & derivatives; Cetirizine hcl; Plavix; and Xyzal    Social History:  The patient  reports that she has never smoked. She has never used smokeless tobacco. She reports that she does not drink alcohol or use illicit drugs.   Family History:  The patient's family history includes Dementia in her father; Healthy in her brother, brother, brother, sister, sister, and sister; Heart attack in her mother; Hypertension in her mother.    ROS:  Please see the history of present illness.    Patient denies fever, chills, headache, sweats, rash, change in vision, change in hearing, chest pain, cough, nausea or vomiting, urinary symptoms. All other systems are reviewed and are negative. She says that she has trace edema in her left ankle intermittently.    PHYSICAL EXAM: VS:  BP 128/82 mmHg  Pulse 68  Ht  5\' 1"  (1.549 m)  Wt 116 lb 6.4 oz (52.799 kg)  BMI 22.01 kg/m2 , Patient is oriented to person time and place. Affect is normal. Head is atraumatic. Sclera and conjunctiva are normal. There is no jugulovenous distention. Lungs are clear. Respiratory effort is nonlabored. Cardiac exam reveals S1 and S2. Abdomen is soft. There is no peripheral edema. There are no musculoskeletal deformities. There are no skin rashes.  EKG:   EKG is done today and reviewed by me. The EKG is normal.   Recent Labs: No results found for requested labs within last 365 days.    Lipid Panel    Component Value Date/Time   CHOL 71 03/15/2013 1025   TRIG 64.0 03/15/2013 1025   HDL 17.40* 03/15/2013 1025   CHOLHDL 4 03/15/2013 1025   VLDL 12.8 03/15/2013  [place] 03/15/2013 1025      Wt Readings from Last 3 Encounters:  02/07/15 116 lb 6.4 oz (52.799 kg)  01/14/15 114 lb (51.71 kg)  11/01/14 114 lb (51.71 kg)      Current medicines are reviewed  The patient understands her medications.     ASSESSMENT AND PLAN:

## 2015-02-07 NOTE — Assessment & Plan Note (Signed)
Blood pressure stable. No change in therapy.

## 2015-02-07 NOTE — Assessment & Plan Note (Signed)
This occurred in 2014. Initially her EF was 25-30%. Her echo in December, 2014 reveals an EF of 60%. Pulmonary pressure had normalized. There were no focal wall motion abnormalities. She does not need a follow-up echo at this time.

## 2015-02-07 NOTE — Patient Instructions (Signed)
**Note De-Identified Apoorva Bugay Obfuscation** Medication Instructions:  Decrease Aspirin to 81 mg daily.  Labwork: None  Testing/Procedures: None  Follow-Up: Your physician wants you to follow-up in: 1 year. You will receive a reminder letter in the mail two months in advance. If you don't receive a letter, please call our office to schedule the follow-up appointment.

## 2015-02-07 NOTE — Assessment & Plan Note (Signed)
The patient had a neurologic event shortly after her cardiac event in the past. She had some question of cardioembolic abnormalities in the left occipital and right thalamic areas. The decision by neurology at that time was to use Plavix. The patient had itching and therefore she was switched to aspirin. Her dose can now be lowered to 81 mg daily.

## 2015-02-08 ENCOUNTER — Other Ambulatory Visit: Payer: Self-pay | Admitting: Cardiology

## 2015-02-13 DIAGNOSIS — K219 Gastro-esophageal reflux disease without esophagitis: Secondary | ICD-10-CM | POA: Diagnosis not present

## 2015-02-13 DIAGNOSIS — K222 Esophageal obstruction: Secondary | ICD-10-CM | POA: Diagnosis not present

## 2015-02-14 ENCOUNTER — Other Ambulatory Visit: Payer: Self-pay | Admitting: *Deleted

## 2015-02-14 DIAGNOSIS — E119 Type 2 diabetes mellitus without complications: Secondary | ICD-10-CM | POA: Diagnosis not present

## 2015-02-14 DIAGNOSIS — J9801 Acute bronchospasm: Secondary | ICD-10-CM | POA: Diagnosis not present

## 2015-02-14 MED ORDER — IRBESARTAN 300 MG PO TABS: ORAL_TABLET | ORAL | Status: DC

## 2015-02-21 DIAGNOSIS — E119 Type 2 diabetes mellitus without complications: Secondary | ICD-10-CM | POA: Diagnosis not present

## 2015-02-21 DIAGNOSIS — J45909 Unspecified asthma, uncomplicated: Secondary | ICD-10-CM | POA: Diagnosis not present

## 2015-03-19 DIAGNOSIS — J441 Chronic obstructive pulmonary disease with (acute) exacerbation: Secondary | ICD-10-CM | POA: Diagnosis not present

## 2015-03-19 DIAGNOSIS — E0800 Diabetes mellitus due to underlying condition with hyperosmolarity without nonketotic hyperglycemic-hyperosmolar coma (NKHHC): Secondary | ICD-10-CM | POA: Diagnosis not present

## 2015-03-19 DIAGNOSIS — I119 Hypertensive heart disease without heart failure: Secondary | ICD-10-CM | POA: Diagnosis not present

## 2015-03-22 ENCOUNTER — Ambulatory Visit: Payer: Self-pay | Admitting: Neurology

## 2015-03-22 DIAGNOSIS — H2513 Age-related nuclear cataract, bilateral: Secondary | ICD-10-CM | POA: Diagnosis not present

## 2015-03-23 ENCOUNTER — Ambulatory Visit (INDEPENDENT_AMBULATORY_CARE_PROVIDER_SITE_OTHER): Payer: Medicare Other | Admitting: Neurology

## 2015-03-23 ENCOUNTER — Encounter: Payer: Self-pay | Admitting: Neurology

## 2015-03-23 VITALS — BP 131/85 | HR 78 | Ht 61.0 in | Wt 119.6 lb

## 2015-03-23 DIAGNOSIS — M542 Cervicalgia: Secondary | ICD-10-CM | POA: Diagnosis not present

## 2015-03-23 NOTE — Progress Notes (Signed)
[facility] [address] [place]. [place] [address] [phone]       OFFICE FOLLOW-UP NOTE  Ms. [place] Date of Birth:  09-09-1945 Medical Record Number:  [id number]   HPI: Ms Drohan is a 27 year Asian lady seen for the first office followup visit today following hospitalization on 01/15/39 and 4 stroke. She had recently been discharged from the hospital following an admission for myocardial infarction. She was noticed to have sudden onset of dysarthria and right facial droop and difficulty walking and was brought by family to the hospital. Her symptoms resolved quickly after presentation and she was not considered to be a TPA candidate. CT scan of the head was unremarkable but MRI scans subsequently showed small left occipital and right thalamic infarcts. Lipid profile and hemoglobin A1c were normal. MRA of the brain was motion degraded but showed no large vessel occlusion. Transthoracic echocardiogram showed decreased ejection fraction of 25-30% with global hypokinesis which was felt to be consistent with a "takutsubo-cardiomyopathy. Carotid Dopplers were unremarkable. She did very well and had no deficits at the time of discharge. She was placed on Plavix but states she was unable to tolerated as she developed itching and this was hence changed back to aspirin. She is tolerating that well without any bleeding, bruising or other side effects. She does have mild dysphagia and because of her GERD and takes Protonix for it. She has trouble swallowing capsules and take pills and in fact has been cutting her aspirin to 2 and taking it. She had repeat echocardiogram done by her cardiologist Dr. Ron Parker which showed improvement. She has no complaints today. She states her blood pressure is under good control. Update 12/29/2013 : She returns for followup last visit 6 months ago. She is doing well without recurrence of her GI symptoms. She states her blood pressure is quite well  controlled at home though it is slightly elevated at 146/86 office today. She has not had  interval medical problems. She continues to take aspirin and Lipitor and is tolerating them without significant bruising, bleeding or myalgias. She has no new complaints today. Update 03/23/2015 : She returns for follow-up after last visit more than a year ago. She continues to do well without recurrent stroke or TIA symptoms. She has reduced the dose of aspirin to 81 mg after discussion with her primary physician as she is doing well. She states her blood pressure is well controlled and it is 131/85 today in office. She states her last lipid profile profile checked a few months ago was fine. She is tolerating Lipitor without any side effects. She has a new complaint of left posterior neck pain for the last couple of weeks. She states she felt creek in the neck and she denies lifting heavy weights. She denies radicular pain and tingling numbness or weakness in her arms. ROS:   14 system review of systems is positive for left-sided neck pain and all other systems negative  PMH:  Past Medical History  Diagnosis Date  . Hypertension   . Asthma   . Seasonal allergies   . High cholesterol   . GERD (gastroesophageal reflux disease)   . Uterine cancer   . Takotsubo cardiomyopathy     a. NSTEMI 2/2 takotsubo 01/2013, no significant CAD by cath, EF 25%;  b. 01/2013 Echo: EF 25-30%, mod LVH, Sev HK in all segments except base. Mild/mod RV dysfxn, PASP 97mmHg.;  c.  Echo 01/17/13: Moderate LVH, EF 45-50%, diffuse HK,  PASP 37  . Pulmonary HTN     a. Mod pulm HTN by cath 01/2013 (PASP 18mmHg).10-30-14 tolerates walking 30 minutes contiuously x 3-4 times weekly, denies SOB or issues.  Marland Kitchen PVC's (premature ventricular contractions)     a. Noted during 01/2013 admission.  . Ejection fraction   . Myocardial infarction     01-10-13- Katz,cardiology -yearly visits next 5'2016, denies any further problems  . Stroke     "Slurred  speech, unsteady gait "01-15-2013- no residual effects.  . Environmental allergies     Social History:  Social History   Social History  . Marital Status: Widowed    Spouse Name: N/A  . Number of Children: 3  . Years of Education: 12th   Occupational History  . clean building    Social History Main Topics  . Smoking status: Never Smoker   . Smokeless tobacco: Never Used  . Alcohol Use: No  . Drug Use: No  . Sexual Activity: No   Other Topics Concern  . Not on file   Social History Narrative   Lives in [place] with her son.  Her dtr also lives nearby.    Medications:   Current Outpatient Prescriptions on File Prior to Visit  Medication Sig Dispense Refill  . albuterol (PROVENTIL) (2.5 MG/3ML) 0.083% nebulizer solution Take 2.5 mg by nebulization 2 (two) times daily.     Marland Kitchen alendronate (FOSAMAX) 70 MG tablet Take 70 mg by mouth every 7 (seven) days. Take with a full glass of water on an empty stomach.    Marland Kitchen amLODipine (NORVASC) 5 MG tablet Take 5 mg by mouth at bedtime.    Marland Kitchen aspirin EC 81 MG EC tablet Take 1 tablet (81 mg total) by mouth daily.    Marland Kitchen atorvastatin (LIPITOR) 40 MG tablet TAKE 1 TABLET (40 MG TOTAL) BY MOUTH DAILY AT 6 PM. 30 tablet 11  . budesonide-formoterol (SYMBICORT) 160-4.5 MCG/ACT inhaler Inhale 2 puffs into the lungs 2 (two) times daily.    . carvedilol (COREG) 25 MG tablet TAKE 1 TABLET (25 MG TOTAL) BY MOUTH 2 (TWO) TIMES DAILY WITH A MEAL. 60 tablet 11  . fluticasone (FLONASE) 50 MCG/ACT nasal spray Place 2 sprays into both nostrils as needed for allergies or rhinitis.    Marland Kitchen irbesartan (AVAPRO) 300 MG tablet TAKE 1 TABLET (300 MG TOTAL) BY MOUTH DAILY. 30 tablet 11  . loratadine (CLARITIN) 10 MG tablet Take 10 mg by mouth daily.      . montelukast (SINGULAIR) 10 MG tablet Take 10 mg by mouth at bedtime.    . pantoprazole (PROTONIX) 40 MG tablet Take 40 mg by mouth daily.    Marland Kitchen SPIRIVA HANDIHALER 18 MCG inhalation capsule Place 1 puff into inhaler and inhale  every morning.  3   No current facility-administered medications on file prior to visit.    Allergies:   Allergies  Allergen Reactions  . Guaifenesin & Derivatives Swelling and Other (See Comments)    Arm swelling  . Cetirizine Hcl Itching  . Plavix [Clopidogrel Bisulfate] Itching  . Xyzal [Cetirizine Hcl] Itching    Physical Exam General: well developed, well nourished, seated, in no evident distress Head: head normocephalic and atraumatic.   Neck: supple with no carotid or supraclavicular bruits Cardiovascular: regular rate and rhythm, no murmurs Musculoskeletal: no deformity. Mild tenderness left posterior neck muscles but no spasm or restriction of movements. Skin:  no rash/petichiae Vascular:  Normal pulses all extremities Filed Vitals:   03/23/15 1037  BP: 131/85  Pulse: 78    Neurologic Exam Mental Status: Awake and fully alert. Oriented to place and time. Recent and remote memory intact. Attention span, concentration and fund of knowledge appropriate. Mood and affect appropriate.  Cranial Nerves: Fundoscopic exam not done Pupils equal, briskly reactive to light. Extraocular movements full without nystagmus. Visual fields full to confrontation. Hearing intact. Facial sensation intact. Face, tongue, palate moves normally and symmetrically.  Motor: Normal bulk and tone. Normal strength in all tested extremity muscles. Sensory.: intact to touch and pinprick and vibratory.  Coordination: Rapid alternating movements normal in all extremities. Finger-to-nose and heel-to-shin performed accurately bilaterally. Gait and Station: Arises from chair without difficulty. Stance is normal. Gait demonstrates normal stride length and balance . Able to heel, toe and tandem walk without difficulty.  Reflexes: 1+ and symmetric. Toes downgoing.     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 Vascular risk factors of hypertension, hyperlipidemia, myocardial infarction and cardiomyopathy. New posterior left sided neck pain likely muscular in nature.    PLAN: I had a long discussion with the patient regarding her remote stroke more than 2 years ago and she seems to bring quite well from neurovascular standpoint. She was advised to continue aspirin 81 mg daily for stroke prevention as well as maintain strict control of lipids with LDL cholesterol goal below 70 mg percent and hypertension with blood pressure goal below 130/90. She was advised to do local heat application as well as muscle relaxant cream application for her muscular left-sided neck pain and also to do regular neck stretching exercises. If the neck pain does not improve over the next few weeks she was advised to see a primary physician for the same. Greater than 50% of time during this 25 minute visit was spent on counseling and coordination of care. No routine follow-up appointment is necessary with me and she can be referred back in the future as needed.  Antony Contras, MD

## 2015-03-23 NOTE — Patient Instructions (Signed)
I had a long discussion with the patient regarding her remote stroke more than 2 years ago and she seems to bring quite well from neurovascular standpoint. She was advised to continue aspirin 81 mg daily for stroke prevention as well as maintain strict control of lipids with LDL cholesterol goal below 70 mg percent and hypertension with blood pressure goal below 130/90. She was advised to do local heat application as well as muscle relaxant cream application for her muscular left-sided neck pain and also to do regular neck stretching exercises. If the neck pain does not improve over the next few weeks she was advised to see a primary physician for the same. Greater than 50% of time during this 25 minute visit was spent on counseling and coordination of care. No routine follow-up appointment is necessary with me and she can be referred back in the future as needed.

## 2015-03-28 DIAGNOSIS — J441 Chronic obstructive pulmonary disease with (acute) exacerbation: Secondary | ICD-10-CM | POA: Diagnosis not present

## 2015-03-28 DIAGNOSIS — E0800 Diabetes mellitus due to underlying condition with hyperosmolarity without nonketotic hyperglycemic-hyperosmolar coma (NKHHC): Secondary | ICD-10-CM | POA: Diagnosis not present

## 2015-04-27 DIAGNOSIS — E0800 Diabetes mellitus due to underlying condition with hyperosmolarity without nonketotic hyperglycemic-hyperosmolar coma (NKHHC): Secondary | ICD-10-CM | POA: Diagnosis not present

## 2015-04-27 DIAGNOSIS — J441 Chronic obstructive pulmonary disease with (acute) exacerbation: Secondary | ICD-10-CM | POA: Diagnosis not present

## 2015-04-27 DIAGNOSIS — E119 Type 2 diabetes mellitus without complications: Secondary | ICD-10-CM | POA: Diagnosis not present

## 2015-05-01 ENCOUNTER — Other Ambulatory Visit: Payer: Self-pay | Admitting: *Deleted

## 2015-05-01 MED ORDER — SPIRIVA HANDIHALER 18 MCG IN CAPS: 1.0000 | ORAL_CAPSULE | Freq: Every morning | RESPIRATORY_TRACT | Status: DC

## 2015-05-30 ENCOUNTER — Other Ambulatory Visit: Payer: Self-pay | Admitting: Neurology

## 2015-05-30 MED ORDER — SPIRIVA HANDIHALER 18 MCG IN CAPS: 1.0000 | ORAL_CAPSULE | Freq: Every morning | RESPIRATORY_TRACT | Status: DC

## 2015-05-31 DIAGNOSIS — J45909 Unspecified asthma, uncomplicated: Secondary | ICD-10-CM | POA: Diagnosis not present

## 2015-05-31 DIAGNOSIS — J441 Chronic obstructive pulmonary disease with (acute) exacerbation: Secondary | ICD-10-CM | POA: Diagnosis not present

## 2015-05-31 DIAGNOSIS — I119 Hypertensive heart disease without heart failure: Secondary | ICD-10-CM | POA: Diagnosis not present

## 2015-06-11 ENCOUNTER — Other Ambulatory Visit: Payer: Self-pay | Admitting: Pediatrics

## 2015-06-27 DIAGNOSIS — I119 Hypertensive heart disease without heart failure: Secondary | ICD-10-CM | POA: Diagnosis not present

## 2015-06-27 DIAGNOSIS — J45909 Unspecified asthma, uncomplicated: Secondary | ICD-10-CM | POA: Diagnosis not present

## 2015-06-27 DIAGNOSIS — J441 Chronic obstructive pulmonary disease with (acute) exacerbation: Secondary | ICD-10-CM | POA: Diagnosis not present

## 2015-06-27 DIAGNOSIS — E089 Diabetes mellitus due to underlying condition without complications: Secondary | ICD-10-CM | POA: Diagnosis not present

## 2015-07-04 ENCOUNTER — Other Ambulatory Visit: Payer: Self-pay | Admitting: Neurology

## 2015-07-04 MED ORDER — SPIRIVA HANDIHALER 18 MCG IN CAPS: 1.0000 | ORAL_CAPSULE | Freq: Every morning | RESPIRATORY_TRACT | Status: DC

## 2015-07-31 ENCOUNTER — Other Ambulatory Visit: Payer: Self-pay | Admitting: Pediatrics

## 2015-08-10 ENCOUNTER — Other Ambulatory Visit: Payer: Self-pay | Admitting: Neurology

## 2015-08-10 ENCOUNTER — Encounter: Payer: Medicare Other | Admitting: Neurology

## 2015-08-10 MED ORDER — SPIRIVA HANDIHALER 18 MCG IN CAPS: 1.0000 | ORAL_CAPSULE | Freq: Every morning | RESPIRATORY_TRACT | Status: DC

## 2015-08-10 NOTE — Progress Notes (Signed)
This encounter was created in error - please disregard.

## 2015-09-12 ENCOUNTER — Other Ambulatory Visit: Payer: Self-pay | Admitting: Pediatrics

## 2015-09-26 DIAGNOSIS — Z0000 Encounter for general adult medical examination without abnormal findings: Secondary | ICD-10-CM | POA: Diagnosis not present

## 2015-09-26 DIAGNOSIS — J441 Chronic obstructive pulmonary disease with (acute) exacerbation: Secondary | ICD-10-CM | POA: Diagnosis not present

## 2015-09-26 DIAGNOSIS — I639 Cerebral infarction, unspecified: Secondary | ICD-10-CM | POA: Diagnosis not present

## 2015-09-26 DIAGNOSIS — E119 Type 2 diabetes mellitus without complications: Secondary | ICD-10-CM | POA: Diagnosis not present

## 2015-10-10 DIAGNOSIS — Z1231 Encounter for screening mammogram for malignant neoplasm of breast: Secondary | ICD-10-CM | POA: Diagnosis not present

## 2015-10-30 ENCOUNTER — Encounter (HOSPITAL_COMMUNITY): Payer: Self-pay | Admitting: Family Medicine

## 2015-10-30 ENCOUNTER — Observation Stay (HOSPITAL_COMMUNITY)
Admission: EM | Admit: 2015-10-30 | Discharge: 2015-11-01 | Disposition: A | Discharge status: Home / Self Care | Payer: Medicare Other | Attending: Internal Medicine | Admitting: Internal Medicine

## 2015-10-30 ENCOUNTER — Emergency Department (HOSPITAL_COMMUNITY): Payer: Medicare Other

## 2015-10-30 DIAGNOSIS — J45901 Unspecified asthma with (acute) exacerbation: Principal | ICD-10-CM | POA: Insufficient documentation

## 2015-10-30 DIAGNOSIS — K219 Gastro-esophageal reflux disease without esophagitis: Secondary | ICD-10-CM | POA: Diagnosis not present

## 2015-10-30 DIAGNOSIS — Z8542 Personal history of malignant neoplasm of other parts of uterus: Secondary | ICD-10-CM | POA: Diagnosis not present

## 2015-10-30 DIAGNOSIS — R0602 Shortness of breath: Secondary | ICD-10-CM | POA: Diagnosis not present

## 2015-10-30 DIAGNOSIS — I10 Essential (primary) hypertension: Secondary | ICD-10-CM | POA: Diagnosis not present

## 2015-10-30 DIAGNOSIS — I252 Old myocardial infarction: Secondary | ICD-10-CM | POA: Insufficient documentation

## 2015-10-30 DIAGNOSIS — Z7982 Long term (current) use of aspirin: Secondary | ICD-10-CM | POA: Insufficient documentation

## 2015-10-30 DIAGNOSIS — E7800 Pure hypercholesterolemia, unspecified: Secondary | ICD-10-CM

## 2015-10-30 DIAGNOSIS — E785 Hyperlipidemia, unspecified: Secondary | ICD-10-CM | POA: Diagnosis not present

## 2015-10-30 DIAGNOSIS — Z8673 Personal history of transient ischemic attack (TIA), and cerebral infarction without residual deficits: Secondary | ICD-10-CM

## 2015-10-30 DIAGNOSIS — I272 Other secondary pulmonary hypertension: Secondary | ICD-10-CM | POA: Diagnosis not present

## 2015-10-30 DIAGNOSIS — I5181 Takotsubo syndrome: Secondary | ICD-10-CM | POA: Diagnosis not present

## 2015-10-30 DIAGNOSIS — Z7952 Long term (current) use of systemic steroids: Secondary | ICD-10-CM | POA: Diagnosis not present

## 2015-10-30 DIAGNOSIS — J9601 Acute respiratory failure with hypoxia: Secondary | ICD-10-CM | POA: Diagnosis present

## 2015-10-30 LAB — BRAIN NATRIURETIC PEPTIDE: B Natriuretic Peptide: 50.2 pg/mL (ref 0.0–100.0)

## 2015-10-30 LAB — BASIC METABOLIC PANEL
Anion gap: 9 (ref 5–15)
BUN: 10 mg/dL (ref 6–20)
CO2: 26 mmol/L (ref 22–32)
Calcium: 9.2 mg/dL (ref 8.9–10.3)
Chloride: 103 mmol/L (ref 101–111)
Creatinine, Ser: 0.92 mg/dL (ref 0.44–1.00)
GFR calc Af Amer: >60 mL/min (ref >60)
GFR calc non Af Amer: >60 mL/min (ref >60)
Glucose, Bld: 110 mg/dL — ABNORMAL HIGH (ref 65–99)
Potassium: 3.7 mmol/L (ref 3.5–5.1)
Sodium: 138 mmol/L (ref 135–145)

## 2015-10-30 LAB — CBC
HCT: 38.1 % (ref 36.0–46.0)
Hemoglobin: 12.2 g/dL (ref 12.0–15.0)
MCH: 28 pg (ref 26.0–34.0)
MCHC: 32 g/dL (ref 30.0–36.0)
MCV: 87.4 fL (ref 78.0–100.0)
Platelets: 235 10*3/uL (ref 150–400)
RBC: 4.36 MIL/uL (ref 3.87–5.11)
RDW: 13.2 % (ref 11.5–15.5)
WBC: 8 10*3/uL (ref 4.0–10.5)

## 2015-10-30 LAB — I-STAT TROPONIN, ED: Troponin i, poc: 0.02 ng/mL (ref 0.00–0.08)

## 2015-10-30 MED ORDER — IPRATROPIUM-ALBUTEROL 0.5-2.5 (3) MG/3ML IN SOLN
3.0000 mL | RESPIRATORY_TRACT | Status: DC | PRN
  Filled 2015-10-30: qty 3

## 2015-10-30 MED ORDER — MOMETASONE FURO-FORMOTEROL FUM 200-5 MCG/ACT IN AERO
2.0000 | INHALATION_SPRAY | Freq: Two times a day (BID) | RESPIRATORY_TRACT | Status: DC
  Administered 2015-10-30 – 2015-11-01 (×4): 2 via RESPIRATORY_TRACT
  Filled 2015-10-30: qty 8.8

## 2015-10-30 MED ORDER — ACETAMINOPHEN 650 MG RE SUPP: 650.0000 mg | Freq: Four times a day (QID) | RECTAL | Status: DC | PRN

## 2015-10-30 MED ORDER — LORATADINE 10 MG PO TABS
10.0000 mg | ORAL_TABLET | Freq: Every day | ORAL | Status: DC
  Administered 2015-10-31 – 2015-11-01 (×2): 10 mg via ORAL
  Filled 2015-10-30 (×2): qty 1

## 2015-10-30 MED ORDER — MONTELUKAST SODIUM 10 MG PO TABS
10.0000 mg | ORAL_TABLET | Freq: Every day | ORAL | Status: DC
  Administered 2015-10-30 – 2015-10-31 (×2): 10 mg via ORAL
  Filled 2015-10-30 (×2): qty 1

## 2015-10-30 MED ORDER — ENOXAPARIN SODIUM 40 MG/0.4ML ~~LOC~~ SOLN
40.0000 mg | SUBCUTANEOUS | Status: DC
  Administered 2015-10-30 – 2015-10-31 (×2): 40 mg via SUBCUTANEOUS
  Filled 2015-10-30 (×2): qty 0.4

## 2015-10-30 MED ORDER — ACETAMINOPHEN 325 MG PO TABS: 650.0000 mg | ORAL_TABLET | Freq: Four times a day (QID) | ORAL | Status: DC | PRN

## 2015-10-30 MED ORDER — IPRATROPIUM-ALBUTEROL 0.5-2.5 (3) MG/3ML IN SOLN
3.0000 mL | RESPIRATORY_TRACT | Status: DC
  Administered 2015-10-30 – 2015-11-01 (×7): 3 mL via RESPIRATORY_TRACT
  Filled 2015-10-30 (×8): qty 3

## 2015-10-30 MED ORDER — IRBESARTAN 300 MG PO TABS
300.0000 mg | ORAL_TABLET | Freq: Every day | ORAL | Status: DC
  Administered 2015-10-31 – 2015-11-01 (×2): 300 mg via ORAL
  Filled 2015-10-30 (×2): qty 1

## 2015-10-30 MED ORDER — ALBUTEROL (5 MG/ML) CONTINUOUS INHALATION SOLN
10.0000 mg/h | INHALATION_SOLUTION | RESPIRATORY_TRACT | Status: DC
Start: 2015-10-30 — End: 2015-11-01
  Administered 2015-10-30: 10 mg/h via RESPIRATORY_TRACT
  Filled 2015-10-30: qty 20

## 2015-10-30 MED ORDER — SODIUM CHLORIDE 0.9 % IV BOLUS (SEPSIS)
1000.0000 mL | Freq: Once | INTRAVENOUS | Status: AC
  Administered 2015-10-30: 1000 mL via INTRAVENOUS

## 2015-10-30 MED ORDER — ASPIRIN EC 81 MG PO TBEC
81.0000 mg | DELAYED_RELEASE_TABLET | Freq: Every day | ORAL | Status: DC
  Administered 2015-10-31 – 2015-11-01 (×2): 81 mg via ORAL
  Filled 2015-10-30 (×2): qty 1

## 2015-10-30 MED ORDER — IPRATROPIUM BROMIDE 0.02 % IN SOLN
0.5000 mg | Freq: Once | RESPIRATORY_TRACT | Status: AC
  Administered 2015-10-30: 0.5 mg via RESPIRATORY_TRACT
  Filled 2015-10-30: qty 2.5

## 2015-10-30 MED ORDER — METHYLPREDNISOLONE SODIUM SUCC 125 MG IJ SOLR
125.0000 mg | Freq: Once | INTRAMUSCULAR | Status: AC
Start: 2015-10-30 — End: 2015-10-30
  Administered 2015-10-30: 125 mg via INTRAVENOUS
  Filled 2015-10-30: qty 2

## 2015-10-30 MED ORDER — AMLODIPINE BESYLATE 5 MG PO TABS
5.0000 mg | ORAL_TABLET | Freq: Every day | ORAL | Status: DC
  Administered 2015-10-30 – 2015-10-31 (×2): 5 mg via ORAL
  Filled 2015-10-30 (×2): qty 1

## 2015-10-30 MED ORDER — IPRATROPIUM-ALBUTEROL 0.5-2.5 (3) MG/3ML IN SOLN
RESPIRATORY_TRACT | Status: AC
  Filled 2015-10-30: qty 3

## 2015-10-30 MED ORDER — ATORVASTATIN CALCIUM 40 MG PO TABS
40.0000 mg | ORAL_TABLET | Freq: Every day | ORAL | Status: DC
  Administered 2015-10-30 – 2015-10-31 (×2): 40 mg via ORAL
  Filled 2015-10-30 (×2): qty 1

## 2015-10-30 MED ORDER — CARVEDILOL 25 MG PO TABS
25.0000 mg | ORAL_TABLET | Freq: Two times a day (BID) | ORAL | Status: DC
  Administered 2015-10-30 – 2015-11-01 (×4): 25 mg via ORAL
  Filled 2015-10-30 (×4): qty 1

## 2015-10-30 MED ORDER — METHYLPREDNISOLONE SODIUM SUCC 125 MG IJ SOLR
60.0000 mg | Freq: Four times a day (QID) | INTRAMUSCULAR | Status: DC
  Administered 2015-10-30 – 2015-10-31 (×3): 60 mg via INTRAVENOUS
  Filled 2015-10-30 (×3): qty 2

## 2015-10-30 MED ORDER — LORATADINE 10 MG PO TABS: 10.0000 mg | ORAL_TABLET | Freq: Every day | ORAL | Status: DC

## 2015-10-30 MED ORDER — MAGNESIUM SULFATE 2 GM/50ML IV SOLN
2.0000 g | Freq: Once | INTRAVENOUS | Status: AC
  Administered 2015-10-30: 2 g via INTRAVENOUS
  Filled 2015-10-30: qty 50

## 2015-10-30 MED ORDER — FLUTICASONE PROPIONATE 50 MCG/ACT NA SUSP
2.0000 | NASAL | Status: DC | PRN
  Filled 2015-10-30: qty 16

## 2015-10-30 MED ORDER — IPRATROPIUM-ALBUTEROL 0.5-2.5 (3) MG/3ML IN SOLN
3.0000 mL | Freq: Four times a day (QID) | RESPIRATORY_TRACT | Status: DC
  Administered 2015-10-30: 3 mL via RESPIRATORY_TRACT

## 2015-10-30 MED ORDER — TIOTROPIUM BROMIDE MONOHYDRATE 18 MCG IN CAPS: 1.0000 | ORAL_CAPSULE | Freq: Every morning | RESPIRATORY_TRACT | Status: DC

## 2015-10-30 MED ORDER — IPRATROPIUM-ALBUTEROL 0.5-2.5 (3) MG/3ML IN SOLN
3.0000 mL | Freq: Once | RESPIRATORY_TRACT | Status: AC
  Administered 2015-10-30: 3 mL via RESPIRATORY_TRACT
  Filled 2015-10-30: qty 3

## 2015-10-30 NOTE — ED Notes (Signed)
Pt here for asthma, wheezing and SOB since yesterday. sts she took a breathing treatment this am with some relief,. Audible wheezing present,.

## 2015-10-30 NOTE — ED Notes (Addendum)
Attempted to call report to [phone] 

## 2015-10-30 NOTE — ED Notes (Signed)
Gave pt Coke Cola, per Santiago Glad - RN.

## 2015-10-30 NOTE — ED Notes (Signed)
2L [place] O2 placed, sats were 88% on RA.

## 2015-10-30 NOTE — H&P (Signed)
[facility] History and Physical                                                                                    Deanna Brown, is a 70 y.o. female  MRN: [id number]   DOB - Dec 18, 1945  Admit Date - 10/30/2015  Outpatient Primary MD for the patient is Elyn Peers, MD  Referring MD: [place] / ER  PMH: Past Medical History  Diagnosis Date  . Hypertension   . Asthma   . Seasonal allergies   . High cholesterol   . GERD (gastroesophageal reflux disease)   . Uterine cancer ([place])   . Takotsubo cardiomyopathy     a. NSTEMI 2/2 takotsubo 01/2013, no significant CAD by cath, EF 25%;  b. 01/2013 Echo: EF 25-30%, mod LVH, Sev HK in all segments except base. Mild/mod RV dysfxn, PASP 60mmHg.;  c.  Echo 01/17/13: Moderate LVH, EF 45-50%, diffuse HK, PASP 37  . Pulmonary HTN ([place])     a. Mod pulm HTN by cath 01/2013 (PASP 70mmHg).10-30-14 tolerates walking 30 minutes contiuously x 3-4 times weekly, denies SOB or issues.  Marland Kitchen PVC's (premature ventricular contractions)     a. Noted during 01/2013 admission.  . Ejection fraction   . Myocardial infarction [facility])     01-10-13- Katz,cardiology -yearly visits next 5'2016, denies any further problems  . Stroke [facility])     "Slurred speech, unsteady gait "01-15-2013- no residual effects.  . Environmental allergies       PSH: Past Surgical History  Procedure Laterality Date  . Abdominal hysterectomy  1999  . Left and right heart catheterization with coronary angiogram N/A 01/11/2013    Procedure: LEFT AND RIGHT HEART CATHETERIZATION WITH CORONARY ANGIOGRAM;  Surgeon: Minus Breeding, MD;  Location: [facility] CATH LAB;  Service: Cardiovascular;  Laterality: N/A;  . Esophagogastroduodenoscopy (egd) with propofol N/A 11/01/2014    Procedure: ESOPHAGOGASTRODUODENOSCOPY (EGD) WITH PROPOFOL;  Surgeon: Laurence Spates, MD;  Location: [facility] ENDOSCOPY;  Service: Endoscopy;  Laterality: N/A;  . Savory dilation N/A 11/01/2014    Procedure: SAVORY DILATION;  Surgeon: Laurence Spates, MD;  Location: [facility] ENDOSCOPY;  Service: Endoscopy;  Laterality: N/A;  . Esophagogastroduodenoscopy (egd) with propofol N/A 11/30/2014    Procedure: ESOPHAGOGASTRODUODENOSCOPY (EGD) WITH PROPOFOL;  Surgeon: Wilford Corner, MD;  Location: [facility] ENDOSCOPY;  Service: Endoscopy;  Laterality: N/A;  . Savory dilation N/A 11/30/2014    Procedure: SAVORY DILATION;  Surgeon: Wilford Corner, MD;  Location: [facility] ENDOSCOPY;  Service: Endoscopy;  Laterality: N/A;  . Balloon dilation N/A 11/30/2014    Procedure: BALLOON DILATION;  Surgeon: Wilford Corner, MD;  Location: [facility] ENDOSCOPY;  Service: Endoscopy;  Laterality: N/A;     CC:  Chief Complaint  Patient presents with  . Shortness of Breath  . Asthma     HPI: 70 year old female patient with known significant asthma on multiple medications including chronic prednisone, history of Takotsubo cardiomyopathy in 2014, hypertension, dyslipidemia, coronary hypertension, dyslipidemia and GERD. She also has a history of CVA. Patient reports that several days ago she began having mild shortness of breath and in the past 24 hours significant increased wheezing and difficulty breathing. She used her baseline inhalers  and nebulizers without improvement in her tenderness. She has not had any fevers or chills but she has been coughing up clear sputum. She has not started any new medications. She has not had any rashes or other symptoms of allergic reaction. Does not check her peak flows at home.  ER Evaluation and treatment: Afebrile-BP 122/75-pulse 81 respirations 20-initial room air saturations between 88 and 90%; initially placed on nasal cannula oxygen but because of increased work of breathing was placed on BiPAP for total of just over 2 hours and after treatment had significant improvement in symptoms and was able to be weaned back to room air. EKG: Sinus rhythm with ventricular rate 80 bpm, QTC 440 ms, no ischemic changes, elevated J point in inferior lateral  leads. 2 View CXR: Mild hyperinflation without any evidence of focal infiltrate Lab data: Na 138, K 3.7, BUN 10, Cr 0.92, glucose 110, BNP 50.2, troponin 0.02, WBC 8000, hemoglobin 12, platelets 235,000 DuoNeb 1 treatment Continuous albuterol nebulizer 10 mg Solu-Medrol 125 mg IV 1 Atrovent nebulizer 1 Magnesium sulfate 2 g IV 1 Normal saline bolus 1 L  Review of Systems   In addition to the HPI above,  No Fever-chills, myalgias or other constitutional symptoms No Headache, changes with Vision or hearing, new weakness, tingling, numbness in any extremity, No problems swallowing food or Liquids, indigestion/reflux No Chest pain, palpitations, orthopnea  No Abdominal pain, N/V; no melena or hematochezia, no dark tarry stools No dysuria, hematuria or flank pain No new skin rashes, lesions, masses or bruises, No new joints pains-aches No recent weight gain or loss No polyuria, polydypsia or polyphagia,  *A full 10 point Review of Systems was done, except as stated above, all other Review of Systems were negative.  Social History Social History  Substance Use Topics  . Smoking status: Never Smoker   . Smokeless tobacco: Never Used  . Alcohol Use: No    Resides at: Private residence  Lives with: Alone  Ambulatory status: Without assistive devices   Family History Family History  Problem Relation Age of Onset  . Heart attack Mother   . Hypertension Mother   . Dementia Father   . Healthy Sister   . Healthy Brother   . Healthy Sister   . Healthy Sister   . Healthy Brother   . Healthy Brother      Prior to Admission medications   Medication Sig Start Date End Date Taking? Authorizing Provider  albuterol (PROVENTIL HFA;VENTOLIN HFA) 108 (90 Base) MCG/ACT inhaler Inhale 2 puffs into the lungs every 6 (six) hours as needed for wheezing or shortness of breath.   Yes Historical Provider, MD  albuterol (PROVENTIL) (2.5 MG/3ML) 0.083% nebulizer solution Take 2.5 mg by  nebulization 2 (two) times daily.    Yes Historical Provider, MD  alendronate (FOSAMAX) 70 MG tablet Take 70 mg by mouth every 7 (seven) days. Take with a full glass of water on an empty stomach.   Yes Historical Provider, MD  amLODipine (NORVASC) 5 MG tablet Take 5 mg by mouth at bedtime.   Yes Historical Provider, MD  aspirin EC 81 MG EC tablet Take 1 tablet (81 mg total) by mouth daily. 02/07/15  Yes Carlena Bjornstad, MD  atorvastatin (LIPITOR) 40 MG tablet TAKE 1 TABLET (40 MG TOTAL) BY MOUTH DAILY AT 6 PM. 02/08/15  Yes Carlena Bjornstad, MD  carvedilol (COREG) 25 MG tablet TAKE 1 TABLET (25 MG TOTAL) BY MOUTH 2 (TWO) TIMES DAILY WITH A MEAL.  02/08/15  Yes Carlena Bjornstad, MD  fluticasone [facility]) 50 MCG/ACT nasal spray Place 2 sprays into both nostrils as needed for allergies or rhinitis.   Yes Historical Provider, MD  irbesartan (AVAPRO) 300 MG tablet TAKE 1 TABLET (300 MG TOTAL) BY MOUTH DAILY. 02/14/15  Yes Carlena Bjornstad, MD  loratadine (CLARITIN) 10 MG tablet Take 10 mg by mouth daily.     Yes Historical Provider, MD  montelukast (SINGULAIR) 10 MG tablet Take 10 mg by mouth at bedtime.   Yes Historical Provider, MD  pantoprazole (PROTONIX) 40 MG tablet Take 40 mg by mouth daily.   Yes Historical Provider, MD  SPIRIVA HANDIHALER 18 MCG inhalation capsule Place 1 capsule (18 mcg total) into inhaler and inhale every morning. 08/10/15  Yes Jiles Prows, MD  SYMBICORT 160-4.5 MCG/ACT inhaler INHALE 2 PUFFS TWICE DAILY TO PREVENT COUGH OR WHEEZE. RINSE, GARGLE, AND SPIT AFTER USE. 07/31/15  Yes Charlies Silvers, MD    Allergies  Allergen Reactions  . Guaifenesin & Derivatives Swelling and Other (See Comments)    Arm swelling  . Cetirizine Hcl Itching  . Plavix [Clopidogrel Bisulfate] Itching  . Xyzal [Cetirizine Hcl] Itching    Physical Exam  Vitals  Blood pressure 115/69, pulse 83, temperature 98 F (36.7 C), temperature source Oral, resp. rate 15, SpO2 93 %.   General:  In no acute distress,  appears healthy and well nourished  Psych:  Normal affect, Denies Suicidal or Homicidal ideations, Awake Alert, Oriented X 3. Speech and thought patterns are clear and appropriate, no apparent short term memory deficits  Neuro:   No focal neurological deficits, CN II through XII intact, Strength 5/5 all 4 extremities, Sensation intact all 4 extremities.  ENT:  Ears and Eyes appear Normal, Conjunctivae clear, PER. Moist oral mucosa without erythema or exudates.  Neck:  Supple, No lymphadenopathy appreciated  Respiratory:  Symmetrical chest wall movement, Good air movement bilaterally, scattered coarse expiratory wheezing primarily in the bases. Room Air  Cardiac:  RRR, No Murmurs, no LE edema noted, no JVD, No carotid bruits, peripheral pulses palpable at 2+  Abdomen:  Positive bowel sounds, Soft, Non tender, Non distended,  No masses appreciated, no obvious hepatosplenomegaly  Skin:  No Cyanosis, Normal Skin Turgor, No Skin Rash or Bruise.  Extremities: Symmetrical without obvious trauma or injury,  no effusions.  Data Review  CBC  Recent Labs Lab 10/30/15 0934  WBC 8.0  HGB 12.2  HCT 38.1  PLT 235  MCV 87.4  MCH 28.0  MCHC 32.0  RDW 13.2    Chemistries   Recent Labs Lab 10/30/15 0934  NA 138  K 3.7  CL 103  CO2 26  GLUCOSE 110*  BUN 10  CREATININE 0.92  CALCIUM 9.2    CrCl cannot be calculated (Unknown ideal weight.).  No results for input(s): TSH, T4TOTAL, T3FREE, THYROIDAB in the last 72 hours.  Invalid input(s): FREET3  Coagulation profile No results for input(s): INR, PROTIME in the last 168 hours.  No results for input(s): DDIMER in the last 72 hours.  Cardiac Enzymes No results for input(s): CKMB, TROPONINI, MYOGLOBIN in the last 168 hours.  Invalid input(s): CK  Invalid input(s): POCBNP  Urinalysis    Component Value Date/Time   COLORURINE YELLOW 01/15/2013 1627   APPEARANCEUR CLEAR 01/15/2013 1627   LABSPEC 1.009 01/15/2013 1627    PHURINE 7.0 01/15/2013 1627   GLUCOSEU NEGATIVE 01/15/2013 1627   HGBUR NEGATIVE 01/15/2013 1627   BILIRUBINUR NEGATIVE  01/15/2013 1627   KETONESUR NEGATIVE 01/15/2013 1627   PROTEINUR NEGATIVE 01/15/2013 1627   UROBILINOGEN 1.0 01/15/2013 1627   NITRITE NEGATIVE 01/15/2013 1627   LEUKOCYTESUR NEGATIVE 01/15/2013 1627    Imaging results:   Dg Chest 2 View  10/30/2015  CLINICAL DATA:  Onset of wheezing and shortness of breath yesterday, history of asthma, some improvement with breathing treatment, abnormal wheezing on chest exam ; history of previous MI, pulmonary hypertension, uterine malignancy. EXAM: CHEST  2 VIEW COMPARISON:  PA and lateral chest x-ray of January 14, 2015 FINDINGS: The lungs are mildly hyperinflated with hemidiaphragm flattening. There is no focal infiltrate. There is no pleural effusion. The heart is normal in size. The pulmonary vascularity is not engorged. The perihilar lung markings are coarse though stable. The bony thorax exhibits no acute abnormality. There is gentle dextrocurvature centered in the mid thoracic spine. IMPRESSION: Mild hyperinflation consistent with known reactive airway disease. There is no evidence of pneumonia nor other acute cardiopulmonary abnormality. Electronically Signed   By: David  Martinique M.D.   On: 10/30/2015 10:03     EKG: (Independently reviewed)  Sinus rhythm with ventricular rate 80 bpm, QTC 440 ms, no ischemic changes, elevated J point in inferior lateral leads.   Assessment & Plan  Principal Problem:    Acute respiratory failure with hypoxia/ Asthma exacerbation/ Pulmonary HTN  -Patient has markedly improved with improving air movement and decrease in wheezing since arrival and no longer is requiring oxygen support; due to severity of presentation monitor patient in observational status for the next 18-24 hours -Admit to floor/Obs -Continue supportive care with oxygen as needed -Scheduled DuoNeb with prn every 2 hours -IV  Solu-Medrol 60 mg every 6 hours with rapid taper; holding home prednisone noting dosage decreased from 10 mg to 5 mg one month ago -Have requested respiratory therapy teach patient regarding measuring peak flow to monitor for asthma exacerbations in the future -Continue preadmission Claritin, Singulair, Symbicort MDI, Flonase, and Spiriva  Active Problems:   Hypertension -Currently controlled -Continue Norvasc, carvedilol as well as Avapro    Takotsubo cardiomyopathy (history of 2014) -Last echocardiogram was in 2014 and revealed recovered LVEF of 60% and only mild LVH    GERD  -Patient reports Protonix recently discontinued by PCP    High cholesterol  -Continue Lipitor    History of CVA  -Has no apparent residual deficits    DVT Prophylaxis: Lovenox  Family Communication:   Son at bedside  Code Status:  Full code  Condition:  Stable  Discharge disposition: Anticipate discharge in 24-48 hours to previous home environment  Time spent in minutes : 60      ELLIS,ALLISON L. ANP on 10/30/2015 at 2:37 PM  You may contact me by going to [email or web address] - password [email or web address]  I am available from 7a-7p but please confirm I am on the schedule by going to Amion as above.   After 7p please contact night coverage person covering me after hours  [facility]

## 2015-10-30 NOTE — Progress Notes (Signed)
New Admission Note:  Arrival Method: Ed stretcher Mental Orientation: alert and oriented Telemetry: 5w20 Assessment: Completed Skin: intact IV: right forearm Pain: none Tubes: none Safety Measures: Safety Fall Prevention Plan was given, discussed and signed. Admission: Completed [address] Orientation: Patient has been orientated to the room, unit and the staff. Family: son present and educated  Orders have been reviewed and implemented. Will continue to monitor the patient. Call light has been placed within reach and bed alarm has been activated.   Fabian Sharp, RN  Phone Number: [phone]

## 2015-10-30 NOTE — ED Notes (Signed)
Assisted to [facility] -- slight wheeze audible-- son at bedside, pt eating yogurt.

## 2015-10-30 NOTE — ED Provider Notes (Signed)
CSN: [id number]     Arrival date & time 10/30/15  0913 History   First MD Initiated Contact with Patient 10/30/15 1008     Chief Complaint  Patient presents with  . Shortness of Breath  . Asthma     (Consider location/radiation/quality/duration/timing/severity/associated sxs/prior Treatment) Patient is a 70 y.o. female presenting with shortness of breath. The history is provided by a relative and the patient.  Shortness of Breath Severity:  Mild Onset quality:  Gradual Duration:  1 day Timing:  Constant Progression:  Worsening Chronicity:  New Context: activity   Context: not fumes and not URI   Relieved by:  None tried Worsened by:  Nothing tried Ineffective treatments:  None tried Associated symptoms: cough and wheezing   Associated symptoms: no abdominal pain, no chest pain and no fever     Past Medical History  Diagnosis Date  . Hypertension   . Asthma   . Seasonal allergies   . High cholesterol   . GERD (gastroesophageal reflux disease)   . Uterine cancer ([place])   . Takotsubo cardiomyopathy     a. NSTEMI 2/2 takotsubo 01/2013, no significant CAD by cath, EF 25%;  b. 01/2013 Echo: EF 25-30%, mod LVH, Sev HK in all segments except base. Mild/mod RV dysfxn, PASP 9mmHg.;  c.  Echo 01/17/13: Moderate LVH, EF 45-50%, diffuse HK, PASP 37  . Pulmonary HTN ([place])     a. Mod pulm HTN by cath 01/2013 (PASP 88mmHg).10-30-14 tolerates walking 30 minutes contiuously x 3-4 times weekly, denies SOB or issues.  Marland Kitchen PVC's (premature ventricular contractions)     a. Noted during 01/2013 admission.  . Ejection fraction   . Myocardial infarction [facility])     01-10-13- Katz,cardiology -yearly visits next 5'2016, denies any further problems  . Stroke [facility])     "Slurred speech, unsteady gait "01-15-2013- no residual effects.  . Environmental allergies    Past Surgical History  Procedure Laterality Date  . Abdominal hysterectomy  1999  . Left and right heart catheterization with coronary angiogram  N/A 01/11/2013    Procedure: LEFT AND RIGHT HEART CATHETERIZATION WITH CORONARY ANGIOGRAM;  Surgeon: Minus Breeding, MD;  Location: [facility] CATH LAB;  Service: Cardiovascular;  Laterality: N/A;  . Esophagogastroduodenoscopy (egd) with propofol N/A 11/01/2014    Procedure: ESOPHAGOGASTRODUODENOSCOPY (EGD) WITH PROPOFOL;  Surgeon: Laurence Spates, MD;  Location: [facility] ENDOSCOPY;  Service: Endoscopy;  Laterality: N/A;  . Savory dilation N/A 11/01/2014    Procedure: SAVORY DILATION;  Surgeon: Laurence Spates, MD;  Location: [facility] ENDOSCOPY;  Service: Endoscopy;  Laterality: N/A;  . Esophagogastroduodenoscopy (egd) with propofol N/A 11/30/2014    Procedure: ESOPHAGOGASTRODUODENOSCOPY (EGD) WITH PROPOFOL;  Surgeon: Wilford Corner, MD;  Location: [facility] ENDOSCOPY;  Service: Endoscopy;  Laterality: N/A;  . Savory dilation N/A 11/30/2014    Procedure: SAVORY DILATION;  Surgeon: Wilford Corner, MD;  Location: [facility] ENDOSCOPY;  Service: Endoscopy;  Laterality: N/A;  . Balloon dilation N/A 11/30/2014    Procedure: BALLOON DILATION;  Surgeon: Wilford Corner, MD;  Location: [facility] ENDOSCOPY;  Service: Endoscopy;  Laterality: N/A;   Family History  Problem Relation Age of Onset  . Heart attack Mother   . Hypertension Mother   . Dementia Father   . Healthy Sister   . Healthy Brother   . Healthy Sister   . Healthy Sister   . Healthy Brother   . Healthy Brother    Social History  Substance Use Topics  . Smoking status: Never Smoker   . Smokeless tobacco: Never  Used  . Alcohol Use: No   OB History    No data available     Review of Systems  Constitutional: Negative for fever.  Respiratory: Positive for cough, shortness of breath and wheezing.   Cardiovascular: Negative for chest pain, palpitations and leg swelling.  Gastrointestinal: Negative for abdominal pain.  Genitourinary: Negative for pelvic pain.  All other systems reviewed and are negative.     Allergies  Guaifenesin & derivatives; Cetirizine hcl; Plavix;  and Xyzal  Home Medications   Prior to Admission medications   Medication Sig Start Date End Date Taking? Authorizing Provider  albuterol (PROVENTIL) (2.5 MG/3ML) 0.083% nebulizer solution Take 2.5 mg by nebulization 2 (two) times daily.     Historical Provider, MD  alendronate (FOSAMAX) 70 MG tablet Take 70 mg by mouth every 7 (seven) days. Take with a full glass of water on an empty stomach.    Historical Provider, MD  amLODipine (NORVASC) 5 MG tablet Take 5 mg by mouth at bedtime.    Historical Provider, MD  aspirin EC 81 MG EC tablet Take 1 tablet (81 mg total) by mouth daily. 02/07/15   Carlena Bjornstad, MD  atorvastatin (LIPITOR) 40 MG tablet TAKE 1 TABLET (40 MG TOTAL) BY MOUTH DAILY AT 6 PM. 02/08/15   Carlena Bjornstad, MD  carvedilol (COREG) 25 MG tablet TAKE 1 TABLET (25 MG TOTAL) BY MOUTH 2 (TWO) TIMES DAILY WITH A MEAL. 02/08/15   Carlena Bjornstad, MD  fluticasone [facility]) 50 MCG/ACT nasal spray Place 2 sprays into both nostrils as needed for allergies or rhinitis.    Historical Provider, MD  irbesartan (AVAPRO) 300 MG tablet TAKE 1 TABLET (300 MG TOTAL) BY MOUTH DAILY. 02/14/15   Carlena Bjornstad, MD  loratadine (CLARITIN) 10 MG tablet Take 10 mg by mouth daily.      Historical Provider, MD  montelukast (SINGULAIR) 10 MG tablet Take 10 mg by mouth at bedtime.    Historical Provider, MD  pantoprazole (PROTONIX) 40 MG tablet Take 40 mg by mouth daily.    Historical Provider, MD  SPIRIVA HANDIHALER 18 MCG inhalation capsule Place 1 capsule (18 mcg total) into inhaler and inhale every morning. 08/10/15   Jiles Prows, MD  SYMBICORT 160-4.5 MCG/ACT inhaler INHALE 2 PUFFS TWICE DAILY TO PREVENT COUGH OR WHEEZE. RINSE, GARGLE, AND SPIT AFTER USE. 07/31/15   Charlies Silvers, MD   BP 133/103 mmHg  Pulse 76  Temp(Src) 98 F (36.7 C) (Oral)  Resp 27  SpO2 87% Physical Exam  Constitutional: She is oriented to person, place, and time. She appears well-developed and well-nourished.  HENT:  Head:  Normocephalic and atraumatic.  Neck: Normal range of motion.  Cardiovascular: Normal rate and regular rhythm.   Pulmonary/Chest: No stridor. Tachypnea noted. She is in respiratory distress. She has wheezes. She has no rales.  Abdominal: Soft. She exhibits no distension.  Musculoskeletal: Normal range of motion. She exhibits no edema.  Neurological: She is alert and oriented to person, place, and time.  Skin: Skin is warm and dry.  Nursing note and vitals reviewed.   ED Course  Procedures (including critical care time)  CRITICAL CARE Performed by: Merrily Pew  Total critical care time: 35 minutes Critical care time was exclusive of separately billable procedures and treating other patients. Critical care was necessary to treat or prevent imminent or life-threatening deterioration. Critical care was time spent personally by me on the following activities: development of treatment plan with patient  and/or surrogate as well as nursing, discussions with consultants, evaluation of patient's response to treatment, examination of patient, obtaining history from patient or surrogate, ordering and performing treatments and interventions, ordering and review of laboratory studies, ordering and review of radiographic studies, pulse oximetry and re-evaluation of patient's condition.   Labs Review Labs Reviewed  BASIC METABOLIC PANEL - Abnormal; Notable for the following:    Glucose, Bld 110 (*)    All other components within normal limits  BASIC METABOLIC PANEL - Abnormal; Notable for the following:    CO2 21 (*)    Glucose, Bld 126 (*)    All other components within normal limits  CBC  BRAIN NATRIURETIC PEPTIDE  I-STAT TROPOININ, ED    Imaging Review Dg Chest 2 View  10/30/2015  CLINICAL DATA:  Onset of wheezing and shortness of breath yesterday, history of asthma, some improvement with breathing treatment, abnormal wheezing on chest exam ; history of previous MI, pulmonary hypertension,  uterine malignancy. EXAM: CHEST  2 VIEW COMPARISON:  PA and lateral chest x-ray of January 14, 2015 FINDINGS: The lungs are mildly hyperinflated with hemidiaphragm flattening. There is no focal infiltrate. There is no pleural effusion. The heart is normal in size. The pulmonary vascularity is not engorged. The perihilar lung markings are coarse though stable. The bony thorax exhibits no acute abnormality. There is gentle dextrocurvature centered in the mid thoracic spine. IMPRESSION: Mild hyperinflation consistent with known reactive airway disease. There is no evidence of pneumonia nor other acute cardiopulmonary abnormality. Electronically Signed   By: David  Martinique M.D.   On: 10/30/2015 10:03   I have personally reviewed and evaluated these images and lab results as part of my medical decision-making.   EKG Interpretation   Date/Time:  Tuesday October 30 2015 09:28:58 EDT Ventricular Rate:  80 PR Interval:  158 QRS Duration: 90 QT Interval:  382 QTC Calculation: 440 R Axis:   78 Text Interpretation:  Normal sinus rhythm Normal ECG improved ST changes  from June 2014 Confirmed by [facility] MD, Corene Cornea [phone]) on 10/30/2015 11:24:25  AM      MDM   Diagnosis: Acute Hypoxic Respiratory Failure  Asthma exacerbation  Likely asthma exacerbation with moderate respiratory distress. Less likely ACS with normal troponin and ecg. cxr without pneumonia.  Bipap for a couple hours with albuterol, magnesium, steroids and patient back to using room air with normal oxygenation. Much improved WOB, still with some decreased BS and wheezing however so will admit to medicine for further duo nebs/treatment.     Merrily Pew, MD 10/31/15 1003

## 2015-10-30 NOTE — Progress Notes (Signed)
Report recieved 

## 2015-10-31 DIAGNOSIS — K219 Gastro-esophageal reflux disease without esophagitis: Secondary | ICD-10-CM

## 2015-10-31 DIAGNOSIS — J4541 Moderate persistent asthma with (acute) exacerbation: Secondary | ICD-10-CM

## 2015-10-31 DIAGNOSIS — J45901 Unspecified asthma with (acute) exacerbation: Secondary | ICD-10-CM | POA: Diagnosis not present

## 2015-10-31 DIAGNOSIS — J9601 Acute respiratory failure with hypoxia: Secondary | ICD-10-CM | POA: Diagnosis not present

## 2015-10-31 LAB — BASIC METABOLIC PANEL
Anion gap: 9 (ref 5–15)
BUN: 13 mg/dL (ref 6–20)
CO2: 21 mmol/L — ABNORMAL LOW (ref 22–32)
Calcium: 9.1 mg/dL (ref 8.9–10.3)
Chloride: 107 mmol/L (ref 101–111)
Creatinine, Ser: 0.76 mg/dL (ref 0.44–1.00)
GFR calc Af Amer: >60 mL/min (ref >60)
GFR calc non Af Amer: >60 mL/min (ref >60)
Glucose, Bld: 126 mg/dL — ABNORMAL HIGH (ref 65–99)
Potassium: 4.2 mmol/L (ref 3.5–5.1)
Sodium: 137 mmol/L (ref 135–145)

## 2015-10-31 MED ORDER — METHYLPREDNISOLONE SODIUM SUCC 125 MG IJ SOLR
60.0000 mg | Freq: Two times a day (BID) | INTRAMUSCULAR | Status: DC
  Administered 2015-10-31 – 2015-11-01 (×2): 60 mg via INTRAVENOUS
  Filled 2015-10-31 (×2): qty 2

## 2015-10-31 NOTE — Care Management Note (Signed)
Case Management Note  Patient Details  Name: Deanna Brown MRN: [id number] Date of Birth: 1946/03/21  Subjective/Objective:                 Admitted in obs for asthma exacerbation.   Action/Plan:  DC to home today self care. Expected Discharge Date:                  Expected Discharge Plan:  Home/Self Care  In-House Referral:     Discharge planning Services  CM Consult  Post Acute Care Choice:  NA Choice offered to:     DME Arranged:    DME Agency:     HH Arranged:    [place] Agency:     Status of Service:  Completed, signed off  Medicare Important Message Given:    Date Medicare IM Given:    Medicare IM give by:    Date Additional Medicare IM Given:    Additional Medicare Important Message give by:     If discussed at [place] of Stay Meetings, dates discussed:    Additional Comments:  Carles Collet, RN 10/31/2015, 1:57 PM

## 2015-10-31 NOTE — Progress Notes (Addendum)
Patient ID: Deanna Brown, female   DOB: 10-Jun-1946, 70 y.o.   MRN: [id number]  [facility] PROGRESS NOTE  Deanna Brown [id number] DOB: 25-May-1946 DOA: 10/30/2015 PCP: Elyn Peers, MD   Brief narrative:    70 year old female patient with known significant asthma on multiple medications including chronic prednisone, history of Takotsubo cardiomyopathy in 2014, hypertension, dyslipidemia, coronary hypertension, presented to [facility] ED with main concern of several days duration of progressively worsening dyspnea that initially started with exertion and has progressed to dyspnea at rest 24 hours prior to this admission, associated with wheezing and with no specific alleviating factors including use of rescue inhalers and prednisone. She was admitted by [facility] for further evaluation of acute asthma exacerbation.   Assessment/Plan:      Acute respiratory failure with hypoxia/ Asthma exacerbation/ Pulmonary HTN  - continues to improve clinically - continue solumedrol with planned tapering, continue BD's scheduled and as needed  - Continue preadmission Claritin, Singulair, Symbicort MDI, Flonase, and Spiriva  Active Problems:  Hypertension, essential  - reasonable inpatient control  -Continue Norvasc, carvedilol as well as Avapro   Takotsubo cardiomyopathy (history of 2014) - Last echocardiogram was in 2014 and revealed recovered LVEF of 60% and only mild LVH   GERD  - Patient reports Protonix recently discontinued by PCP   High cholesterol  - Continue Lipitor   History of CVA  - Has no apparent residual deficits  DVT prophylaxis - Lovenox SQ  Code Status: Full.  Family Communication:  plan of care discussed with the patient Disposition Plan: Home by 3/31  IV access:  Peripheral IV  Procedures and diagnostic studies:    Dg Chest 2 View 10/30/2015  Mild hyperinflation consistent with known reactive airway disease. There is no evidence of pneumonia nor other acute  cardiopulmonary abnormality  Medical Consultants:  None  Other Consultants:  None  IAnti-Infectives:   None  Faye Ramsay, MD  [facility] Pager [phone]  If 7PM-7AM, please contact night-coverage [email or web address] Password [facility] 10/31/2015, 6:38 AM  HPI/Subjective: No events overnight.   Objective: Filed Vitals:   10/30/15 1656 10/30/15 1700 10/30/15 2307 10/31/15 0439  BP: 122/63  121/59 156/84  Pulse: 83  83 90  Temp: 98.2 F (36.8 C)  97.9 F (36.6 C) 99.4 F (37.4 C)  TempSrc: Oral  Oral Oral  Resp: 19  18 18   Height:  5\' 3"  (1.6 m)    Weight:  54 kg (119 lb 0.8 oz)    SpO2: 96%  100% 98%   No intake or output data in the 24 hours ending 10/31/15 [id number]  Exam:   General:  Pt is alert, follows commands appropriately, not in acute distress  Cardiovascular: Regular rate and rhythm, S1/S2, no murmurs, no rubs, no gallops  Respiratory: no wheezing, diminished breath sounds at bases   Abdomen: Soft, non tender, non distended, bowel sounds present, no guarding  Extremities: No edema, pulses DP and PT palpable bilaterally  Neuro: Grossly nonfocal  Data Reviewed: Basic Metabolic Panel:  Recent Labs Lab 10/30/15 0934 10/31/15 0525  NA 138 137  K 3.7 4.2  CL 103 107  CO2 26 21*  GLUCOSE 110* 126*  BUN 10 13  CREATININE 0.92 0.76  CALCIUM 9.2 9.1   CBC:  Recent Labs Lab 10/30/15 0934  WBC 8.0  HGB 12.2  HCT 38.1  MCV 87.4  PLT 235    Scheduled Meds: . amLODipine  5 mg Oral QHS  . aspirin EC  81 mg Oral  Daily  . atorvastatin  40 mg Oral q1800  . carvedilol  25 mg Oral BID WC  . enoxaparin (LOVENOX) injection  40 mg Subcutaneous Q24H  . ipratropium-albuterol  3 mL Nebulization Q4H  . irbesartan  300 mg Oral Daily  . loratadine  10 mg Oral Daily  . methylPREDNISolone (SOLU-MEDROL) injection  60 mg Intravenous Q6H  . mometasone-formoterol  2 puff Inhalation BID  . montelukast  10 mg Oral QHS   Continuous Infusions: . albuterol 10 mg/hr  (10/30/15 1043)

## 2015-10-31 NOTE — Care Management Obs Status (Signed)
[place] NOTIFICATION   Patient Details  Name: Deanna Brown MRN: [id number] Date of Birth: 1945/08/26   Medicare Observation Status Notification Given:  Yes    Carles Collet, RN 10/31/2015, 8:50 AM

## 2015-11-01 DIAGNOSIS — J9601 Acute respiratory failure with hypoxia: Secondary | ICD-10-CM | POA: Diagnosis not present

## 2015-11-01 DIAGNOSIS — J45901 Unspecified asthma with (acute) exacerbation: Secondary | ICD-10-CM | POA: Diagnosis not present

## 2015-11-01 DIAGNOSIS — J4541 Moderate persistent asthma with (acute) exacerbation: Secondary | ICD-10-CM | POA: Diagnosis not present

## 2015-11-01 DIAGNOSIS — K219 Gastro-esophageal reflux disease without esophagitis: Secondary | ICD-10-CM | POA: Diagnosis not present

## 2015-11-01 LAB — BASIC METABOLIC PANEL
Anion gap: 8 (ref 5–15)
BUN: 19 mg/dL (ref 6–20)
CO2: 23 mmol/L (ref 22–32)
Calcium: 9.1 mg/dL (ref 8.9–10.3)
Chloride: 107 mmol/L (ref 101–111)
Creatinine, Ser: 0.81 mg/dL (ref 0.44–1.00)
GFR calc Af Amer: >60 mL/min (ref >60)
GFR calc non Af Amer: >60 mL/min (ref >60)
Glucose, Bld: 135 mg/dL — ABNORMAL HIGH (ref 65–99)
Potassium: 4.2 mmol/L (ref 3.5–5.1)
Sodium: 138 mmol/L (ref 135–145)

## 2015-11-01 LAB — CBC
HCT: 35.3 % — ABNORMAL LOW (ref 36.0–46.0)
Hemoglobin: 11.4 g/dL — ABNORMAL LOW (ref 12.0–15.0)
MCH: 27.9 pg (ref 26.0–34.0)
MCHC: 32.3 g/dL (ref 30.0–36.0)
MCV: 86.3 fL (ref 78.0–100.0)
Platelets: 266 10*3/uL (ref 150–400)
RBC: 4.09 MIL/uL (ref 3.87–5.11)
RDW: 13.2 % (ref 11.5–15.5)
WBC: 20.6 10*3/uL — ABNORMAL HIGH (ref 4.0–10.5)

## 2015-11-01 MED ORDER — IPRATROPIUM-ALBUTEROL 0.5-2.5 (3) MG/3ML IN SOLN
3.0000 mL | Freq: Four times a day (QID) | RESPIRATORY_TRACT | Status: DC
  Administered 2015-11-01: 3 mL via RESPIRATORY_TRACT
  Filled 2015-11-01 (×2): qty 3

## 2015-11-01 MED ORDER — IPRATROPIUM-ALBUTEROL 0.5-2.5 (3) MG/3ML IN SOLN: 3.0000 mL | RESPIRATORY_TRACT | Status: DC | PRN

## 2015-11-01 MED ORDER — PREDNISONE 10 MG PO TABS: ORAL_TABLET | ORAL | Status: DC

## 2015-11-01 NOTE — Discharge Instructions (Signed)
Asthma, Adult Asthma is a recurring condition in which the airways tighten and narrow. Asthma can make it difficult to breathe. It can cause coughing, wheezing, and shortness of breath. Asthma episodes, also called asthma attacks, range from minor to life-threatening. Asthma cannot be cured, but medicines and lifestyle changes can help control it. CAUSES Asthma is believed to be caused by inherited (genetic) and environmental factors, but its exact cause is unknown. Asthma may be triggered by allergens, lung infections, or irritants in the air. Asthma triggers are different for each person. Common triggers include:   Animal dander.  Dust mites.  Cockroaches.  Pollen from trees or grass.  Mold.  Smoke.  Air pollutants such as dust, household cleaners, hair sprays, aerosol sprays, paint fumes, strong chemicals, or strong odors.  Cold air, weather changes, and winds (which increase molds and pollens in the air).  Strong emotional expressions such as crying or laughing hard.  Stress.  Certain medicines (such as aspirin) or types of drugs (such as beta-blockers).  Sulfites in foods and drinks. Foods and drinks that may contain sulfites include dried fruit, potato chips, and sparkling grape juice.  Infections or inflammatory conditions such as the flu, a cold, or an inflammation of the nasal membranes (rhinitis).  Gastroesophageal reflux disease (GERD).  Exercise or strenuous activity. SYMPTOMS Symptoms may occur immediately after asthma is triggered or many hours later. Symptoms include:  Wheezing.  Excessive nighttime or early morning coughing.  Frequent or severe coughing with a common cold.  Chest tightness.  Shortness of breath. DIAGNOSIS  The diagnosis of asthma is made by a review of your medical history and a physical exam. Tests may also be performed. These may include:  Lung function studies. These tests show how much air you breathe in and out.  Allergy  tests.  Imaging tests such as X-rays. TREATMENT  Asthma cannot be cured, but it can usually be controlled. Treatment involves identifying and avoiding your asthma triggers. It also involves medicines. There are 2 classes of medicine used for asthma treatment:   Controller medicines. These prevent asthma symptoms from occurring. They are usually taken every day.  Reliever or rescue medicines. These quickly relieve asthma symptoms. They are used as needed and provide short-term relief. Your health care provider will help you create an asthma action plan. An asthma action plan is a written plan for managing and treating your asthma attacks. It includes a list of your asthma triggers and how they may be avoided. It also includes information on when medicines should be taken and when their dosage should be changed. An action plan may also involve the use of a device called a peak flow meter. A peak flow meter measures how well the lungs are working. It helps you monitor your condition. HOME CARE INSTRUCTIONS   Take medicines only as directed by your health care provider. Speak with your health care provider if you have questions about how or when to take the medicines.  Use a peak flow meter as directed by your health care provider. Record and keep track of readings.  Understand and use the action plan to help minimize or stop an asthma attack without needing to seek medical care.  Control your home environment in the following ways to help prevent asthma attacks:  Do not smoke. Avoid being exposed to secondhand smoke.  Change your heating and air conditioning filter regularly.  Limit your use of fireplaces and wood stoves.  Get rid of pests (such as roaches   and mice) and their droppings.  Throw away plants if you see mold on them.  Clean your floors and dust regularly. Use unscented cleaning products.  Try to have someone else vacuum for you regularly. Stay out of rooms while they are  being vacuumed and for a short while afterward. If you vacuum, use a dust mask from a hardware store, a double-layered or microfilter vacuum cleaner bag, or a vacuum cleaner with a HEPA filter.  Replace carpet with wood, tile, or vinyl flooring. Carpet can trap dander and dust.  Use allergy-proof pillows, mattress covers, and box spring covers.  Wash bed sheets and blankets every week in hot water and dry them in a dryer.  Use blankets that are made of polyester or cotton.  Clean bathrooms and kitchens with bleach. If possible, have someone repaint the walls in these rooms with mold-resistant paint. Keep out of the rooms that are being cleaned and painted.  Wash hands frequently. SEEK MEDICAL CARE IF:   You have wheezing, shortness of breath, or a cough even if taking medicine to prevent attacks.  The colored mucus you cough up (sputum) is thicker than usual.  Your sputum changes from clear or white to yellow, green, gray, or bloody.  You have any problems that may be related to the medicines you are taking (such as a rash, itching, swelling, or trouble breathing).  You are using a reliever medicine more than 2-3 times per week.  Your peak flow is still at 50-79% of your personal best after following your action plan for 1 hour.  You have a fever. SEEK IMMEDIATE MEDICAL CARE IF:   You seem to be getting worse and are unresponsive to treatment during an asthma attack.  You are short of breath even at rest.  You get short of breath when doing very little physical activity.  You have difficulty eating, drinking, or talking due to asthma symptoms.  You develop chest pain.  You develop a fast heartbeat.  You have a bluish color to your lips or fingernails.  You are light-headed, dizzy, or faint.  Your peak flow is less than 50% of your personal best.   This information is not intended to replace advice given to you by your health care provider. Make sure you discuss any  questions you have with your health care provider.   Document Released: 07/21/2005 Document Revised: 04/11/2015 Document Reviewed: 02/17/2013 Elsevier Interactive Patient Education 2016 Elsevier Inc.  

## 2015-11-01 NOTE — Discharge Summary (Signed)
Physician Discharge Summary  Deanna Brown [id number] DOB: 02-18-1946 DOA: 10/30/2015  PCP: Elyn Peers, MD  Admit date: 10/30/2015 Discharge date: 11/01/2015  Recommendations for Outpatient Follow-up:  1. Pt will need to follow up with PCP in 2-3 weeks post discharge 2. Please obtain BMP to evaluate electrolytes and kidney function 3. Please also check CBC to evaluate Hg and Hct levels 4. Complete Prednisone tapering as outlined below   Discharge Diagnoses:  Principal Problem:   Asthma exacerbation Active Problems:   GERD (gastroesophageal reflux disease)   Pulmonary HTN (HCC)   High cholesterol   Hypertension   Takotsubo cardiomyopathy (history of 2014)   History of CVA (cerebrovascular accident)   Acute respiratory failure with hypoxia ([place])   Acute asthma exacerbation  Discharge Condition: Stable  Diet recommendation: Heart healthy diet discussed in details    Brief narrative:    70 year old female patient with known significant asthma on multiple medications including chronic prednisone, history of Takotsubo cardiomyopathy in 2014, hypertension, dyslipidemia, coronary hypertension, presented to [facility] ED with main concern of several days duration of progressively worsening dyspnea that initially started with exertion and has progressed to dyspnea at rest 24 hours prior to this admission, associated with wheezing and with no specific alleviating factors including use of rescue inhalers and prednisone. She was admitted by [facility] for further evaluation of acute asthma exacerbation.   Assessment/Plan:     Acute respiratory failure with hypoxia/ Asthma exacerbation/ Pulmonary HTN  - continues to improve clinically - continue prednisone taper upon discharge, continue BD's as needed  - Continue Claritin, Singulair, Symbicort MDI, Flonase, and Spiriva  Active Problems:  Hypertension, essential  - reasonable inpatient control  - Continue Norvasc, carvedilol as well as  Avapro   Takotsubo cardiomyopathy (history of 2014) - Last echocardiogram was in 2014 and revealed recovered LVEF of 60% and only mild LVH   GERD  - Patient reports Protonix recently discontinued by PCP   High cholesterol  - Continue statin   History of CVA  - Has no apparent residual deficits  Code Status: Full.  Family Communication: plan of care discussed with the patient Disposition Plan: Home   IV access:  Peripheral IV  Procedures and diagnostic studies:   Dg Chest 2 View 10/30/2015 Mild hyperinflation consistent with known reactive airway disease. There is no evidence of pneumonia nor other acute cardiopulmonary abnormality  Medical Consultants:  None  Other Consultants:  None  IAnti-Infectives:   None     Discharge Exam: Filed Vitals:   11/01/15 0559 11/01/15 0721  BP: 175/78 146/72  Pulse: 88 90  Temp: 98.3 F (36.8 C) 98.3 F (36.8 C)  Resp: 18 16   Filed Vitals:   11/01/15 0559 11/01/15 0721 11/01/15 0815 11/01/15 0829  BP: 175/78 146/72    Pulse: 88 90    Temp: 98.3 F (36.8 C) 98.3 F (36.8 C)    TempSrc: Oral Oral    Resp: 18 16    Height:      Weight:      SpO2: 92% 95% 96% 96%    General: Pt is alert, follows commands appropriately, not in acute distress Cardiovascular: Regular rate and rhythm, no rubs, no gallops Respiratory: Clear to auscultation bilaterally, diminished breath sounds at bases  Abdominal: Soft, non tender, non distended, bowel sounds +, no guarding  Discharge Instructions    Medication List    TAKE these medications        albuterol 108 (90 Base) MCG/ACT inhaler  Commonly known as:  PROVENTIL HFA;VENTOLIN HFA  Inhale 2 puffs into the lungs every 6 (six) hours as needed for wheezing or shortness of breath.     alendronate 70 MG tablet  Commonly known as:  FOSAMAX  Take 70 mg by mouth every 7 (seven) days. Take with a full glass of water on an empty stomach.     amLODipine 5 MG tablet   Commonly known as:  NORVASC  Take 5 mg by mouth at bedtime.     aspirin 81 MG EC tablet  Take 1 tablet (81 mg total) by mouth daily.     atorvastatin 40 MG tablet  Commonly known as:  LIPITOR  TAKE 1 TABLET (40 MG TOTAL) BY MOUTH DAILY AT 6 PM.     carvedilol 25 MG tablet  Commonly known as:  COREG  TAKE 1 TABLET (25 MG TOTAL) BY MOUTH 2 (TWO) TIMES DAILY WITH A MEAL.     fluticasone 50 MCG/ACT nasal spray  Commonly known as:  FLONASE  Place 2 sprays into both nostrils as needed for allergies or rhinitis.     ipratropium-albuterol 0.5-2.5 (3) MG/3ML Soln  Commonly known as:  DUONEB  Take 3 mLs by nebulization every 4 (four) hours as needed.     irbesartan 300 MG tablet  Commonly known as:  AVAPRO  TAKE 1 TABLET (300 MG TOTAL) BY MOUTH DAILY.     loratadine 10 MG tablet  Commonly known as:  CLARITIN  Take 10 mg by mouth daily.     montelukast 10 MG tablet  Commonly known as:  SINGULAIR  Take 10 mg by mouth at bedtime.     pantoprazole 40 MG tablet  Commonly known as:  PROTONIX  Take 40 mg by mouth daily.     predniSONE 10 MG tablet  Commonly known as:  DELTASONE  Take 60 mg tablet today 3/30 and taper down by 10 mg daily until completed     SPIRIVA HANDIHALER 18 MCG inhalation capsule  Generic drug:  tiotropium  Place 1 capsule (18 mcg total) into inhaler and inhale every morning.     SYMBICORT 160-4.5 MCG/ACT inhaler  Generic drug:  budesonide-formoterol  INHALE 2 PUFFS TWICE DAILY TO PREVENT COUGH OR WHEEZE. RINSE, GARGLE, AND SPIT AFTER USE.          Medication List    TAKE these medications        albuterol 108 (90 Base) MCG/ACT inhaler  Commonly known as:  PROVENTIL HFA;VENTOLIN HFA  Inhale 2 puffs into the lungs every 6 (six) hours as needed for wheezing or shortness of breath.     alendronate 70 MG tablet  Commonly known as:  FOSAMAX  Take 70 mg by mouth every 7 (seven) days. Take with a full glass of water on an empty stomach.     amLODipine  5 MG tablet  Commonly known as:  NORVASC  Take 5 mg by mouth at bedtime.     aspirin 81 MG EC tablet  Take 1 tablet (81 mg total) by mouth daily.     atorvastatin 40 MG tablet  Commonly known as:  LIPITOR  TAKE 1 TABLET (40 MG TOTAL) BY MOUTH DAILY AT 6 PM.     carvedilol 25 MG tablet  Commonly known as:  COREG  TAKE 1 TABLET (25 MG TOTAL) BY MOUTH 2 (TWO) TIMES DAILY WITH A MEAL.     fluticasone 50 MCG/ACT nasal spray  Commonly known as:  FLONASE  Place 2  sprays into both nostrils as needed for allergies or rhinitis.     ipratropium-albuterol 0.5-2.5 (3) MG/3ML Soln  Commonly known as:  DUONEB  Take 3 mLs by nebulization every 4 (four) hours as needed.     irbesartan 300 MG tablet  Commonly known as:  AVAPRO  TAKE 1 TABLET (300 MG TOTAL) BY MOUTH DAILY.     loratadine 10 MG tablet  Commonly known as:  CLARITIN  Take 10 mg by mouth daily.     montelukast 10 MG tablet  Commonly known as:  SINGULAIR  Take 10 mg by mouth at bedtime.     pantoprazole 40 MG tablet  Commonly known as:  PROTONIX  Take 40 mg by mouth daily.     predniSONE 10 MG tablet  Commonly known as:  DELTASONE  Take 60 mg tablet today 3/30 and taper down by 10 mg daily until completed     SPIRIVA HANDIHALER 18 MCG inhalation capsule  Generic drug:  tiotropium  Place 1 capsule (18 mcg total) into inhaler and inhale every morning.     SYMBICORT 160-4.5 MCG/ACT inhaler  Generic drug:  budesonide-formoterol  INHALE 2 PUFFS TWICE DAILY TO PREVENT COUGH OR WHEEZE. RINSE, GARGLE, AND SPIT AFTER USE.           Follow-up Information    Follow up with Elyn Peers, MD.   Specialty:  Family Medicine   Contact information:   [address] [place] [address] [phone]        The results of significant diagnostics from this hospitalization (including imaging, microbiology, ancillary and laboratory) are listed below for reference.     Microbiology: No results found for this or any  previous visit (from the past 240 hour(s)).   Labs: Basic Metabolic Panel:  Recent Labs Lab 10/30/15 0934 10/31/15 0525 11/01/15 0606  NA 138 137 138  K 3.7 4.2 4.2  CL 103 107 107  CO2 26 21* 23  GLUCOSE 110* 126* 135*  BUN 10 13 19   CREATININE 0.92 0.76 0.81  CALCIUM 9.2 9.1 9.1   CBC:  Recent Labs Lab 10/30/15 0934 11/01/15 0606  WBC 8.0 20.6*  HGB 12.2 11.4*  HCT 38.1 35.3*  MCV 87.4 86.3  PLT 235 266   BNP (last 3 results)  Recent Labs  10/30/15 0934  BNP 50.2   SIGNED: Time coordinating discharge: 30 minutes  Faye Ramsay, MD  [facility] 11/01/2015, 10:02 AM Pager [phone]  If 7PM-7AM, please contact night-coverage [email or web address] Password [email or web address]

## 2015-11-19 ENCOUNTER — Other Ambulatory Visit: Payer: Self-pay | Admitting: *Deleted

## 2015-11-19 MED ORDER — SPIRIVA HANDIHALER 18 MCG IN CAPS: 1.0000 | ORAL_CAPSULE | Freq: Every morning | RESPIRATORY_TRACT | Status: DC

## 2015-11-29 DIAGNOSIS — I639 Cerebral infarction, unspecified: Secondary | ICD-10-CM | POA: Diagnosis not present

## 2015-11-29 DIAGNOSIS — I119 Hypertensive heart disease without heart failure: Secondary | ICD-10-CM | POA: Diagnosis not present

## 2015-11-29 DIAGNOSIS — E119 Type 2 diabetes mellitus without complications: Secondary | ICD-10-CM | POA: Diagnosis not present

## 2015-11-29 DIAGNOSIS — J45909 Unspecified asthma, uncomplicated: Secondary | ICD-10-CM | POA: Diagnosis not present

## 2015-12-14 ENCOUNTER — Other Ambulatory Visit: Payer: Self-pay

## 2015-12-14 MED ORDER — SPIRIVA HANDIHALER 18 MCG IN CAPS: 1.0000 | ORAL_CAPSULE | Freq: Every morning | RESPIRATORY_TRACT | Status: DC

## 2016-01-10 ENCOUNTER — Other Ambulatory Visit: Payer: Self-pay

## 2016-02-12 ENCOUNTER — Telehealth: Payer: Self-pay

## 2016-02-12 ENCOUNTER — Other Ambulatory Visit: Payer: Self-pay | Admitting: Cardiology

## 2016-02-12 MED ORDER — CARVEDILOL 25 MG PO TABS: 25.0000 mg | ORAL_TABLET | Freq: Two times a day (BID) | ORAL | Status: DC

## 2016-02-12 MED ORDER — ATORVASTATIN CALCIUM 40 MG PO TABS: 40.0000 mg | ORAL_TABLET | Freq: Every day | ORAL | Status: DC

## 2016-02-12 NOTE — Telephone Encounter (Signed)
Message     ----- Message from Ron Agee, [place] sent at 02/12/2016 9:19 AM EDT -----    Ok to refill atorvastatin (LIPITOR) 40 MG tabletTAKE 1 TABLET (40 MG TOTAL) BY MOUTH DAILY AT 6 PM and carvedilol (COREG) 25 MG tabletTAKE 1 TABLET (25 MG TOTAL) BY MOUTH 2 (TWO) TIMES DAILY WITH A MEAL for patient? She is former Dr Ron Parker patient with upcoming appointment with Dr. Radford Pax.    Prescriptions filled to [facility] with Dr. Radford Pax.

## 2016-02-13 ENCOUNTER — Ambulatory Visit: Payer: Medicare Other | Admitting: Cardiology

## 2016-02-26 ENCOUNTER — Other Ambulatory Visit: Payer: Self-pay | Admitting: Cardiology

## 2016-02-27 ENCOUNTER — Telehealth: Payer: Self-pay | Admitting: Cardiology

## 2016-02-27 ENCOUNTER — Other Ambulatory Visit: Payer: Self-pay | Admitting: Allergy and Immunology

## 2016-02-27 ENCOUNTER — Other Ambulatory Visit: Payer: Self-pay | Admitting: Cardiology

## 2016-02-27 DIAGNOSIS — J45909 Unspecified asthma, uncomplicated: Secondary | ICD-10-CM | POA: Diagnosis not present

## 2016-02-27 DIAGNOSIS — I5181 Takotsubo syndrome: Secondary | ICD-10-CM | POA: Diagnosis not present

## 2016-02-27 DIAGNOSIS — I119 Hypertensive heart disease without heart failure: Secondary | ICD-10-CM | POA: Diagnosis not present

## 2016-02-27 DIAGNOSIS — E0800 Diabetes mellitus due to underlying condition with hyperosmolarity without nonketotic hyperglycemic-hyperosmolar coma (NKHHC): Secondary | ICD-10-CM | POA: Diagnosis not present

## 2016-02-27 NOTE — Telephone Encounter (Signed)
Can request a year on the Irbesartan when he comes to his appt with Dr Radford Pax.

## 2016-02-27 NOTE — Telephone Encounter (Signed)
°*  STAT* If patient is at the pharmacy, call can be transferred to refill team.   1. Which medications need to be refilled? (please list name of each medication and dose if known) IRBESARTAN  2. Which pharmacy/location (including street and city if local pharmacy) is medication to be sent to?  [facility]  3. Do they need a 30 day or 90 day supply? 51yr

## 2016-03-03 ENCOUNTER — Other Ambulatory Visit: Payer: Self-pay | Admitting: Allergy and Immunology

## 2016-03-16 ENCOUNTER — Encounter (HOSPITAL_COMMUNITY): Payer: Self-pay

## 2016-03-16 ENCOUNTER — Emergency Department (HOSPITAL_COMMUNITY): Payer: Medicare Other

## 2016-03-16 ENCOUNTER — Emergency Department (HOSPITAL_COMMUNITY)
Admission: EM | Admit: 2016-03-16 | Discharge: 2016-03-16 | Disposition: A | Discharge status: Home / Self Care | Payer: Medicare Other | Attending: Emergency Medicine | Admitting: Emergency Medicine

## 2016-03-16 DIAGNOSIS — Z8542 Personal history of malignant neoplasm of other parts of uterus: Secondary | ICD-10-CM | POA: Diagnosis not present

## 2016-03-16 DIAGNOSIS — Z7982 Long term (current) use of aspirin: Secondary | ICD-10-CM | POA: Diagnosis not present

## 2016-03-16 DIAGNOSIS — Z79899 Other long term (current) drug therapy: Secondary | ICD-10-CM | POA: Diagnosis not present

## 2016-03-16 DIAGNOSIS — R05 Cough: Secondary | ICD-10-CM | POA: Diagnosis not present

## 2016-03-16 DIAGNOSIS — J441 Chronic obstructive pulmonary disease with (acute) exacerbation: Secondary | ICD-10-CM | POA: Insufficient documentation

## 2016-03-16 DIAGNOSIS — I252 Old myocardial infarction: Secondary | ICD-10-CM | POA: Diagnosis not present

## 2016-03-16 DIAGNOSIS — I10 Essential (primary) hypertension: Secondary | ICD-10-CM | POA: Insufficient documentation

## 2016-03-16 DIAGNOSIS — R0602 Shortness of breath: Secondary | ICD-10-CM | POA: Diagnosis not present

## 2016-03-16 DIAGNOSIS — Z8673 Personal history of transient ischemic attack (TIA), and cerebral infarction without residual deficits: Secondary | ICD-10-CM | POA: Diagnosis not present

## 2016-03-16 LAB — CBC WITH DIFFERENTIAL/PLATELET
Basophils Absolute: 0.1 10*3/uL (ref 0.0–0.1)
Basophils Relative: 1 %
Eosinophils Absolute: 1.5 10*3/uL — ABNORMAL HIGH (ref 0.0–0.7)
Eosinophils Relative: 19 %
HCT: 38.7 % (ref 36.0–46.0)
Hemoglobin: 12.5 g/dL (ref 12.0–15.0)
Lymphocytes Relative: 23 %
Lymphs Abs: 1.8 10*3/uL (ref 0.7–4.0)
MCH: 27.4 pg (ref 26.0–34.0)
MCHC: 32.3 g/dL (ref 30.0–36.0)
MCV: 84.9 fL (ref 78.0–100.0)
Monocytes Absolute: 0.8 10*3/uL (ref 0.1–1.0)
Monocytes Relative: 10 %
Neutro Abs: 3.5 10*3/uL (ref 1.7–7.7)
Neutrophils Relative %: 47 %
Platelets: 245 10*3/uL (ref 150–400)
RBC: 4.56 MIL/uL (ref 3.87–5.11)
RDW: 13.8 % (ref 11.5–15.5)
WBC: 7.5 10*3/uL (ref 4.0–10.5)

## 2016-03-16 LAB — BASIC METABOLIC PANEL
Anion gap: 5 (ref 5–15)
BUN: 7 mg/dL (ref 6–20)
CO2: 25 mmol/L (ref 22–32)
Calcium: 9.1 mg/dL (ref 8.9–10.3)
Chloride: 107 mmol/L (ref 101–111)
Creatinine, Ser: 0.8 mg/dL (ref 0.44–1.00)
GFR calc Af Amer: >60 mL/min (ref >60)
GFR calc non Af Amer: >60 mL/min (ref >60)
Glucose, Bld: 108 mg/dL — ABNORMAL HIGH (ref 65–99)
Potassium: 3.3 mmol/L — ABNORMAL LOW (ref 3.5–5.1)
Sodium: 137 mmol/L (ref 135–145)

## 2016-03-16 LAB — I-STAT TROPONIN, ED: Troponin i, poc: 0.01 ng/mL (ref 0.00–0.08)

## 2016-03-16 LAB — BRAIN NATRIURETIC PEPTIDE: B Natriuretic Peptide: 39.4 pg/mL (ref 0.0–100.0)

## 2016-03-16 MED ORDER — PREDNISONE 20 MG PO TABS: ORAL_TABLET | ORAL | 0 refills | Status: DC

## 2016-03-16 MED ORDER — IPRATROPIUM BROMIDE 0.02 % IN SOLN
0.5000 mg | Freq: Once | RESPIRATORY_TRACT | Status: AC
  Administered 2016-03-16: 0.5 mg via RESPIRATORY_TRACT
  Filled 2016-03-16: qty 2.5

## 2016-03-16 MED ORDER — ALBUTEROL SULFATE (2.5 MG/3ML) 0.083% IN NEBU
INHALATION_SOLUTION | RESPIRATORY_TRACT | Status: AC
  Administered 2016-03-16: 5 mg via RESPIRATORY_TRACT
  Filled 2016-03-16: qty 6

## 2016-03-16 MED ORDER — ALBUTEROL SULFATE (2.5 MG/3ML) 0.083% IN NEBU
5.0000 mg | INHALATION_SOLUTION | Freq: Once | RESPIRATORY_TRACT | Status: AC
  Administered 2016-03-16: 5 mg via RESPIRATORY_TRACT
  Filled 2016-03-16: qty 6

## 2016-03-16 MED ORDER — ALBUTEROL SULFATE (2.5 MG/3ML) 0.083% IN NEBU
5.0000 mg | INHALATION_SOLUTION | Freq: Once | RESPIRATORY_TRACT | Status: AC
  Administered 2016-03-16: 5 mg via RESPIRATORY_TRACT

## 2016-03-16 MED ORDER — PREDNISONE 20 MG PO TABS
60.0000 mg | ORAL_TABLET | Freq: Once | ORAL | Status: AC
  Administered 2016-03-16: 60 mg via ORAL
  Filled 2016-03-16: qty 3

## 2016-03-16 NOTE — ED Provider Notes (Signed)
Medical screening examination/treatment/procedure(s) were performed by non-physician practitioner and as supervising physician I was immediately available for consultation/collaboration.   EKG Interpretation  Date/Time:  Sunday March 16 2016 [id number] EDT Ventricular Rate:  75 PR Interval:    QRS Duration: 101 QT Interval:  397 QTC Calculation: 444 R Axis:   70 Text Interpretation:  Sinus rhythm Probable left ventricular hypertrophy No significant change since last tracing Confirmed by Zenia Resides  MD, Sahil Milner ([id number]) on 03/16/2016 3:53:16 PM        Lacretia Leigh, MD 03/16/16 1553

## 2016-03-16 NOTE — ED Provider Notes (Signed)
[place] DEPT Provider Note   CSN: [id number] Arrival date & time: 03/16/16  [id number]  First Provider Contact:  First MD Initiated Contact with Patient 03/16/16 1024        History   Chief Complaint Chief Complaint  Patient presents with  . Shortness of Breath    HPI Deanna Brown is a 70 y.o. female.  Deanna Brown is a 70 y.o. Female who presents to the emergency department complaining of an asthma exacerbation. Patient reports she feels her asthma has been worsened over the past week. She reports over the past day or 2 she's had increasing wheezing and shortness of breath. She denies any chest pain or chest tightness. She reports she takes her albuterol inhaler 4 times a day. She does have a history of MI. Patient also has a history of asthma exacerbation and has previously required intubation and ICU stay for her asthma previously. Patient denies fevers, coughing, chest pain, hemoptysis, abdominal pain, nausea, vomiting or rashes.   The history is provided by the patient. No language interpreter was used.    Past Medical History:  Diagnosis Date  . Asthma   . Ejection fraction   . Environmental allergies   . GERD (gastroesophageal reflux disease)   . High cholesterol   . Hypertension   . Myocardial infarction [facility])    01-10-13- [facility],cardiology -yearly visits next 5'2016, denies any further problems  . Pulmonary HTN ([place])    a. Mod pulm HTN by cath 01/2013 (PASP 70mmHg).10-30-14 tolerates walking 30 minutes contiuously x 3-4 times weekly, denies SOB or issues.  Marland Kitchen PVC's (premature ventricular contractions)    a. Noted during 01/2013 admission.  . Seasonal allergies   . Stroke [facility])    "Slurred speech, unsteady gait "01-15-2013- no residual effects.  . Takotsubo cardiomyopathy    a. NSTEMI 2/2 takotsubo 01/2013, no significant CAD by cath, EF 25%;  b. 01/2013 Echo: EF 25-30%, mod LVH, Sev HK in all segments except base. Mild/mod RV dysfxn, PASP 69mmHg.;  c.  Echo  01/17/13: Moderate LVH, EF 45-50%, diffuse HK, PASP 37  . Uterine cancer [facility])     Patient Active Problem List   Diagnosis Date Noted  . Asthma exacerbation 10/30/2015  . Acute respiratory failure with hypoxia ([place]) 10/30/2015  . Acute asthma exacerbation 10/30/2015  . Neck pain on left side 03/23/2015  . Stricture and stenosis of esophagus 11/30/2014  . Difficulty in swallowing 11/30/2014  . Ejection fraction   . Aortic valve sclerosis 07/12/2013  . History of CVA (cerebrovascular accident) 06/13/2013  . Pulmonary HTN ([place])   . PVC's (premature ventricular contractions)   . Uterine cancer ([place])   . High cholesterol   . Hypertension   . Takotsubo cardiomyopathy (history of 2014)   . GERD (gastroesophageal reflux disease) 01/15/2013    Past Surgical History:  Procedure Laterality Date  . ABDOMINAL HYSTERECTOMY  1999  . BALLOON DILATION N/A 11/30/2014   Procedure: BALLOON DILATION;  Surgeon: Wilford Corner, MD;  Location[facility] ENDOSCOPY;  Service: Endoscopy;  Laterality: N/A;  . ESOPHAGOGASTRODUODENOSCOPY (EGD) WITH PROPOFOL N/A 11/01/2014   Procedure: ESOPHAGOGASTRODUODENOSCOPY (EGD) WITH PROPOFOL;  Surgeon: Laurence Spates, MD;  Location: [facility] ENDOSCOPY;  Service: Endoscopy;  Laterality: N/A;  . ESOPHAGOGASTRODUODENOSCOPY (EGD) WITH PROPOFOL N/A 11/30/2014   Procedure: ESOPHAGOGASTRODUODENOSCOPY (EGD) WITH PROPOFOL;  Surgeon: Wilford Corner, MD;  Location: [facility] ENDOSCOPY;  Service: Endoscopy;  Laterality: N/A;  . LEFT AND RIGHT HEART CATHETERIZATION WITH CORONARY ANGIOGRAM N/A 01/11/2013   Procedure: LEFT AND  RIGHT HEART CATHETERIZATION WITH CORONARY ANGIOGRAM;  Surgeon: Minus Breeding, MD;  Location: [facility] CATH LAB;  Service: Cardiovascular;  Laterality: N/A;  . SAVORY DILATION N/A 11/01/2014   Procedure: SAVORY DILATION;  Surgeon: Laurence Spates, MD;  Location: [facility] ENDOSCOPY;  Service: Endoscopy;  Laterality: N/A;  . SAVORY DILATION N/A 11/30/2014   Procedure: SAVORY DILATION;  Surgeon:  Wilford Corner, MD;  Location: [facility] ENDOSCOPY;  Service: Endoscopy;  Laterality: N/A;    OB History    No data available       Home Medications    Prior to Admission medications   Medication Sig Start Date End Date Taking? Authorizing Provider  albuterol (PROVENTIL HFA;VENTOLIN HFA) 108 (90 Base) MCG/ACT inhaler Inhale 2 puffs into the lungs every 6 (six) hours as needed for wheezing or shortness of breath.    Historical Provider, MD  alendronate (FOSAMAX) 70 MG tablet Take 70 mg by mouth every 7 (seven) days. Take with a full glass of water on an empty stomach.    Historical Provider, MD  amLODipine (NORVASC) 5 MG tablet Take 5 mg by mouth at bedtime.    Historical Provider, MD  aspirin EC 81 MG EC tablet Take 1 tablet (81 mg total) by mouth daily. 02/07/15   Carlena Bjornstad, MD  atorvastatin (LIPITOR) 40 MG tablet Take 1 tablet (40 mg total) by mouth daily at 6 PM. 02/12/16   Sueanne Margarita, MD  carvedilol (COREG) 25 MG tablet Take 1 tablet (25 mg total) by mouth 2 (two) times daily with a meal. 02/12/16   Sueanne Margarita, MD  fluticasone (FLONASE) 50 MCG/ACT nasal spray Place 2 sprays into both nostrils as needed for allergies or rhinitis.    Historical Provider, MD  ipratropium-albuterol (DUONEB) 0.5-2.5 (3) MG/3ML SOLN Take 3 mLs by nebulization every 4 (four) hours as needed. 11/01/15   Theodis Blaze, MD  irbesartan (AVAPRO) 300 MG tablet TAKE 1 TABLET BY MOUTH DAILY. 02/26/16   Sueanne Margarita, MD  irbesartan (AVAPRO) 300 MG tablet TAKE 1 TABLET BY MOUTH DAILY. 02/27/16   Sueanne Margarita, MD  loratadine (CLARITIN) 10 MG tablet Take 10 mg by mouth daily.      Historical Provider, MD  montelukast (SINGULAIR) 10 MG tablet Take 10 mg by mouth at bedtime.    Historical Provider, MD  pantoprazole (PROTONIX) 40 MG tablet Take 40 mg by mouth daily.    Historical Provider, MD  predniSONE (DELTASONE) 10 MG tablet Take 60 mg tablet today 3/30 and taper down by 10 mg daily until completed 11/01/15   Theodis Blaze, MD  predniSONE (DELTASONE) 20 MG tablet Take 2 tablets (40 mg total) daily for 3 days, then 1 tablet daily (20 mg total) for 3 days, then 1/2 tablet daily (5 mg total) for 4 days. 03/16/16   Waynetta Pean, PA-C  SPIRIVA HANDIHALER 18 MCG inhalation capsule Place 1 capsule (18 mcg total) into inhaler and inhale every morning. 12/14/15   Jiles Prows, MD  SYMBICORT 160-4.5 MCG/ACT inhaler INHALE 2 PUFFS TWICE DAILY TO PREVENT COUGH OR WHEEZE. RINSE, GARGLE, AND SPIT AFTER USE. 07/31/15   Charlies Silvers, MD    Family History Family History  Problem Relation Age of Onset  . Heart attack Mother   . Hypertension Mother   . Dementia Father   . Healthy Sister   . Healthy Brother   . Healthy Sister   . Healthy Sister   . Healthy Brother   . Healthy  Brother     Social History Social History  Substance Use Topics  . Smoking status: Never Smoker  . Smokeless tobacco: Never Used  . Alcohol use No     Allergies   Guaifenesin & derivatives; Cetirizine hcl; Plavix [clopidogrel bisulfate]; and Xyzal [cetirizine hcl]   Review of Systems Review of Systems  Constitutional: Negative for chills and fever.  HENT: Negative for congestion and sore throat.   Eyes: Negative for visual disturbance.  Respiratory: Positive for shortness of breath and wheezing. Negative for cough and chest tightness.   Cardiovascular: Negative for chest pain, palpitations and leg swelling.  Gastrointestinal: Negative for abdominal pain, nausea and vomiting.  Genitourinary: Negative for dysuria.  Musculoskeletal: Negative for back pain and neck pain.  Skin: Negative for rash.  Neurological: Negative for weakness, light-headedness and headaches.     Physical Exam Updated Vital Signs BP 137/83   Pulse 71   Temp 98.3 F (36.8 C) (Oral)   Resp 18   SpO2 95%   Physical Exam  Constitutional: She appears well-developed and well-nourished. No distress.  Nontoxic appearing.  HENT:  Head: Normocephalic and  atraumatic.  Mouth/Throat: Oropharynx is clear and moist.  Eyes: Conjunctivae are normal. Pupils are equal, round, and reactive to light. Right eye exhibits no discharge. Left eye exhibits no discharge.  Neck: Normal range of motion. Neck supple. No JVD present. No tracheal deviation present.  Cardiovascular: Normal rate, regular rhythm, normal heart sounds and intact distal pulses.  Exam reveals no gallop and no friction rub.   No murmur heard. Pulmonary/Chest: Effort normal. No stridor. She has wheezes. She has no rales.  Some increased work of breathing. Patient's peaking in complete sentences. Expiratory wheezes noted. No rales or rhonchi.  Abdominal: Soft. There is no tenderness.  Musculoskeletal: She exhibits no edema or tenderness.  No lower extremity edema or tenderness.  Lymphadenopathy:    She has no cervical adenopathy.  Neurological: She is alert. Coordination normal.  Skin: Skin is warm and dry. Capillary refill takes less than 2 seconds. No rash noted. She is not diaphoretic. No erythema. No pallor.  Psychiatric: She has a normal mood and affect. Her behavior is normal.  Nursing note and vitals reviewed.    ED Treatments / Results  Labs (all labs ordered are listed, but only abnormal results are displayed) Labs Reviewed  BASIC METABOLIC PANEL - Abnormal; Notable for the following:       Result Value   Potassium 3.3 (*)    Glucose, Bld 108 (*)    All other components within normal limits  CBC WITH DIFFERENTIAL/PLATELET - Abnormal; Notable for the following:    Eosinophils Absolute 1.5 (*)    All other components within normal limits  BRAIN NATRIURETIC PEPTIDE  I-STAT TROPOININ, ED    EKG  EKG Interpretation  Date/Time:  Sunday March 16 2016 10:28:24 EDT Ventricular Rate:  75 PR Interval:    QRS Duration: 101 QT Interval:  397 QTC Calculation: 444 R Axis:   70 Text Interpretation:  Sinus rhythm Probable left ventricular hypertrophy No significant change  since last tracing Confirmed by ALLEN  MD, ANTHONY ([id number]) on 03/16/2016 3:53:16 PM       Radiology Dg Chest 2 View  Result Date: 03/16/2016 CLINICAL DATA:  Shortness of breath, cough and wheezing for 1 week worse since yesterday, history asthma, no relief with inhaler, history hypertension, Takotsubo cardiomyopathy, prior MI and stroke EXAM: CHEST  2 VIEW COMPARISON:  10/30/2015 FINDINGS: Enlargement of cardiac silhouette.  Mediastinal contours and pulmonary vascularity normal. Atherosclerotic calcification aorta. Lungs emphysematous but clear. EKG leads project over the upper lobes bilaterally. No pleural effusion or pneumothorax. Bones demineralized. IMPRESSION: Enlargement of cardiac silhouette. Suspect COPD. No acute abnormalities. Electronically Signed   By: Lavonia Dana M.D.   On: 03/16/2016 11:15    Procedures Procedures (including critical care time)  Medications Ordered in ED Medications  albuterol (PROVENTIL) (2.5 MG/3ML) 0.083% nebulizer solution 5 mg (5 mg Nebulization Given 03/16/16 0954)  albuterol (PROVENTIL) (2.5 MG/3ML) 0.083% nebulizer solution 5 mg (5 mg Nebulization Given 03/16/16 1115)  ipratropium (ATROVENT) nebulizer solution 0.5 mg (0.5 mg Nebulization Given 03/16/16 1115)  predniSONE (DELTASONE) tablet 60 mg (60 mg Oral Given 03/16/16 1211)     Initial Impression / Assessment and Plan / ED Course  I have reviewed the triage vital signs and the nursing notes.  Pertinent labs & imaging results that were available during my care of the patient were reviewed by me and considered in my medical decision making (see chart for details).  Clinical Course   This is a 70 y.o. Female who presents to the emergency department complaining of an asthma exacerbation. Patient reports she feels her asthma has been worsened over the past week. She reports over the past day or 2 she's had increasing wheezing and shortness of breath. She denies any chest pain or chest tightness. She  reports she takes her albuterol inhaler 4 times a day. She does have a history of MI. Patient also has a history of asthma exacerbation and has previously required intubation and ICU stay for her asthma previously.  On exam the patient is afebrile nontoxic-appearing. She has some slight increased work of breathing with expiratory wheezes noted bilaterally. Some coughing noted. Oxygen saturation is 99% on room air. Troponin is not elevated. BMP and CBC are unremarkable. BNP is within normal limits. Chest x-ray shows enlargement of cardiac silhouette and COPD changes. Patient has had no chest pain or palpitations. EKG shows no STEMI. At reevaluation after patient had a total of 2 breathing treatments patient reports she is feeling back to normal. She feels ready for discharge. No wheezes, rhonchi or rales on repeat lung exam. She is breathing normally. Oxygen saturation 100% on room air. Will provide with prednisone prior to discharge and discharge with a short prednisone taper for COPD exacerbation. I discussed return precautions. I advised the patient to follow-up with their primary care provider this week. I advised the patient to return to the emergency department with new or worsening symptoms or new concerns. The patient verbalized understanding and agreement with plan.    This patient was discussed with Dr. Zenia Resides who agrees with assessment and plan.    Final Clinical Impressions(s) / ED Diagnoses   Final diagnoses:  COPD exacerbation [facility])    New Prescriptions Discharge Medication List as of 03/16/2016 12:03 PM    START taking these medications   Details  !! predniSONE (DELTASONE) 20 MG tablet Take 2 tablets (40 mg total) daily for 3 days, then 1 tablet daily (20 mg total) for 3 days, then 1/2 tablet daily (5 mg total) for 4 days., Print     !! - Potential duplicate medications found. Please discuss with provider.       Waynetta Pean, PA-C 03/16/16 1553    Lacretia Leigh, MD 03/17/16  [phone]

## 2016-03-19 DIAGNOSIS — E0800 Diabetes mellitus due to underlying condition with hyperosmolarity without nonketotic hyperglycemic-hyperosmolar coma (NKHHC): Secondary | ICD-10-CM | POA: Diagnosis not present

## 2016-03-19 DIAGNOSIS — Z6821 Body mass index (BMI) 21.0-21.9, adult: Secondary | ICD-10-CM | POA: Diagnosis not present

## 2016-03-19 DIAGNOSIS — J441 Chronic obstructive pulmonary disease with (acute) exacerbation: Secondary | ICD-10-CM | POA: Diagnosis not present

## 2016-03-19 DIAGNOSIS — I119 Hypertensive heart disease without heart failure: Secondary | ICD-10-CM | POA: Diagnosis not present

## 2016-04-08 ENCOUNTER — Encounter: Payer: Self-pay | Admitting: Cardiology

## 2016-04-08 ENCOUNTER — Ambulatory Visit (INDEPENDENT_AMBULATORY_CARE_PROVIDER_SITE_OTHER): Payer: Medicare Other | Admitting: Cardiology

## 2016-04-08 VITALS — BP 108/54 | HR 72 | Ht 63.0 in | Wt 126.6 lb

## 2016-04-08 DIAGNOSIS — I272 Other secondary pulmonary hypertension: Secondary | ICD-10-CM

## 2016-04-08 DIAGNOSIS — I10 Essential (primary) hypertension: Secondary | ICD-10-CM

## 2016-04-08 DIAGNOSIS — I493 Ventricular premature depolarization: Secondary | ICD-10-CM | POA: Diagnosis not present

## 2016-04-08 DIAGNOSIS — I5181 Takotsubo syndrome: Secondary | ICD-10-CM | POA: Diagnosis not present

## 2016-04-08 LAB — BASIC METABOLIC PANEL
BUN: 9 mg/dL (ref 7–25)
CO2: 28 mmol/L (ref 20–31)
Calcium: 9.4 mg/dL (ref 8.6–10.4)
Chloride: 107 mmol/L (ref 98–110)
Creat: 0.9 mg/dL (ref 0.50–0.99)
Glucose, Bld: 70 mg/dL (ref 65–99)
Potassium: 3.9 mmol/L (ref 3.5–5.3)
Sodium: 142 mmol/L (ref 135–146)

## 2016-04-08 NOTE — Progress Notes (Signed)
Cardiology Office Note    Date:  04/08/2016   ID:  [name], DOB Dec 23, 1945, MRN [id number]  PCP:  Elyn Peers, MD  Cardiologist:  Fransico Him, MD   Chief Complaint  Patient presents with  . Cardiomyopathy  . Hypertension    History of Present Illness:  Deanna Brown is a 70 y.o. female with a history of asthma, GERD, PVCs, HTN, moderate pulmonary HTN, PVCs, Takotsubo CM with NSTEMI 2014 with initial EF 25-30% but repeat EF 6 months later by echo was 60% and pulmonary HTN had resolved.  She presents today for followup.  She had been followed by Dr. Ron Parker until he retired.  She is doing well today.  She denies any chest pain,  LE edema, dizziness, palpitations or syncope.  She has chronic SOB due to asthma.      Past Medical History:  Diagnosis Date  . Asthma   . Ejection fraction   . Environmental allergies   . GERD (gastroesophageal reflux disease)   . High cholesterol   . Hypertension   . Myocardial infarction [facility])    01-10-13- [facility],cardiology -yearly visits next 5'2016, denies any further problems  . Pulmonary HTN ([place])    a. Mod pulm HTN by cath 01/2013 (PASP 21mmHg).10-30-14 tolerates walking 30 minutes contiuously x 3-4 times weekly, denies SOB or issues.  Marland Kitchen PVC's (premature ventricular contractions)    a. Noted during 01/2013 admission.  . Seasonal allergies   . Stroke [facility])    "Slurred speech, unsteady gait "01-15-2013- no residual effects.  . Takotsubo cardiomyopathy    a. NSTEMI 2/2 takotsubo 01/2013, no significant CAD by cath, EF 25%;  b. 01/2013 Echo: EF 25-30%, mod LVH, Sev HK in all segments except base. Mild/mod RV dysfxn, PASP 55mmHg.;  c.  Echo 07/2013: normal LVF with EF 60% and normal PAP  . Uterine cancer [facility])     Past Surgical History:  Procedure Laterality Date  . ABDOMINAL HYSTERECTOMY  1999  . BALLOON DILATION N/A 11/30/2014   Procedure: BALLOON DILATION;  Surgeon: Wilford Corner, MD;  Location: [facility] ENDOSCOPY;  Service: Endoscopy;   Laterality: N/A;  . ESOPHAGOGASTRODUODENOSCOPY (EGD) WITH PROPOFOL N/A 11/01/2014   Procedure: ESOPHAGOGASTRODUODENOSCOPY (EGD) WITH PROPOFOL;  Surgeon: Laurence Spates, MD;  Location: [facility] ENDOSCOPY;  Service: Endoscopy;  Laterality: N/A;  . ESOPHAGOGASTRODUODENOSCOPY (EGD) WITH PROPOFOL N/A 11/30/2014   Procedure: ESOPHAGOGASTRODUODENOSCOPY (EGD) WITH PROPOFOL;  Surgeon: Wilford Corner, MD;  Location: [facility] ENDOSCOPY;  Service: Endoscopy;  Laterality: N/A;  . LEFT AND RIGHT HEART CATHETERIZATION WITH CORONARY ANGIOGRAM N/A 01/11/2013   Procedure: LEFT AND RIGHT HEART CATHETERIZATION WITH CORONARY ANGIOGRAM;  Surgeon: Minus Breeding, MD;  Location: [facility] CATH LAB;  Service: Cardiovascular;  Laterality: N/A;  . SAVORY DILATION N/A 11/01/2014   Procedure: SAVORY DILATION;  Surgeon: Laurence Spates, MD;  Location: [facility] ENDOSCOPY;  Service: Endoscopy;  Laterality: N/A;  . SAVORY DILATION N/A 11/30/2014   Procedure: SAVORY DILATION;  Surgeon: Wilford Corner, MD;  Location: [facility] ENDOSCOPY;  Service: Endoscopy;  Laterality: N/A;    Current Medications: Outpatient Medications Prior to Visit  Medication Sig Dispense Refill  . albuterol (PROVENTIL HFA;VENTOLIN HFA) 108 (90 Base) MCG/ACT inhaler Inhale 2 puffs into the lungs every 6 (six) hours as needed for wheezing or shortness of breath.    Marland Kitchen alendronate (FOSAMAX) 70 MG tablet Take 70 mg by mouth every 7 (seven) days. Take with a full glass of water on an empty stomach.    Marland Kitchen amLODipine (NORVASC) 5 MG tablet  Take 5 mg by mouth at bedtime.    Marland Kitchen aspirin EC 81 MG EC tablet Take 1 tablet (81 mg total) by mouth daily.    Marland Kitchen atorvastatin (LIPITOR) 40 MG tablet Take 1 tablet (40 mg total) by mouth daily at 6 PM. 30 tablet 1  . carvedilol (COREG) 25 MG tablet Take 1 tablet (25 mg total) by mouth 2 (two) times daily with a meal. 60 tablet 1  . fluticasone (FLONASE) 50 MCG/ACT nasal spray Place 2 sprays into both nostrils as needed for allergies or rhinitis.    Marland Kitchen irbesartan  (AVAPRO) 300 MG tablet TAKE 1 TABLET BY MOUTH DAILY. 60 tablet 0  . irbesartan (AVAPRO) 300 MG tablet TAKE 1 TABLET BY MOUTH DAILY. 60 tablet 0  . loratadine (CLARITIN) 10 MG tablet Take 10 mg by mouth daily.      . montelukast (SINGULAIR) 10 MG tablet Take 10 mg by mouth at bedtime.    . SYMBICORT 160-4.5 MCG/ACT inhaler INHALE 2 PUFFS TWICE DAILY TO PREVENT COUGH OR WHEEZE. RINSE, GARGLE, AND SPIT AFTER USE. 1 Inhaler 0  . ipratropium-albuterol (DUONEB) 0.5-2.5 (3) MG/3ML SOLN Take 3 mLs by nebulization every 4 (four) hours as needed. (Patient not taking: Reported on 04/08/2016) 500 mL 1  . pantoprazole (PROTONIX) 40 MG tablet Take 40 mg by mouth daily.    . predniSONE (DELTASONE) 10 MG tablet Take 60 mg tablet today 3/30 and taper down by 10 mg daily until completed (Patient not taking: Reported on 04/08/2016) 21 tablet 0  . predniSONE (DELTASONE) 20 MG tablet Take 2 tablets (40 mg total) daily for 3 days, then 1 tablet daily (20 mg total) for 3 days, then 1/2 tablet daily (5 mg total) for 4 days. (Patient not taking: Reported on 04/08/2016) 11 tablet 0  . SPIRIVA HANDIHALER 18 MCG inhalation capsule Place 1 capsule (18 mcg total) into inhaler and inhale every morning. (Patient not taking: Reported on 04/08/2016) 30 capsule 0   No facility-administered medications prior to visit.      Allergies:   Guaifenesin & derivatives; Cetirizine hcl; Plavix [clopidogrel bisulfate]; and Xyzal [cetirizine hcl]   Social History   Social History  . Marital status: Widowed    Spouse name: N/A  . Number of children: 3  . Years of education: 12th   Occupational History  . clean building [employer]   Social History Main Topics  . Smoking status: Never Smoker  . Smokeless tobacco: Never Used  . Alcohol use No  . Drug use: No  . Sexual activity: No   Other Topics Concern  . None   Social History Narrative   Lives in [place] with her son.  Her dtr also lives nearby.     Family History:  The patient's  family history includes Dementia in her father; Healthy in her brother, brother, brother, sister, sister, and sister; Heart attack in her mother; Hypertension in her mother.   ROS:   Please see the history of present illness.    ROS All other systems reviewed and are negative.   PHYSICAL EXAM:   VS:  BP (!) 108/54   Pulse 72   Ht 5\' 3"  (1.6 m)   Wt 126 lb 9.6 oz (57.4 kg)   SpO2 96%   BMI 22.43 kg/m    GEN: Well nourished, well developed, in no acute distress  HEENT: normal  Neck: no JVD, carotid bruits, or masses Cardiac: RRR; no murmurs, rubs, or gallops,no edema.  Intact distal pulses  bilaterally.  Respiratory:  clear to auscultation bilaterally, normal work of breathing GI: soft, nontender, nondistended, + BS MS: no deformity or atrophy  Skin: warm and dry, no rash Neuro:  Alert and Oriented x 3, Strength and sensation are intact Psych: euthymic mood, full affect  Wt Readings from Last 3 Encounters:  04/08/16 126 lb 9.6 oz (57.4 kg)  10/30/15 119 lb 0.8 oz (54 kg)  03/23/15 119 lb 9.6 oz (54.3 kg)      Studies/Labs Reviewed:   EKG:  EKG is not ordered today.    Recent Labs: 03/16/2016: B Natriuretic Peptide 39.4; BUN 7; Creatinine, Ser 0.80; Hemoglobin 12.5; Platelets 245; Potassium 3.3; Sodium 137   Lipid Panel    Component Value Date/Time   CHOL 71 03/15/2013 1025   TRIG 64.0 03/15/2013 1025   HDL 17.40 (L) 03/15/2013 1025   CHOLHDL 4 03/15/2013 1025   VLDL 12.8 03/15/2013 1025   LDLCALC 41 03/15/2013 1025    Additional studies/ records that were reviewed today include:  none    ASSESSMENT:    1. Takotsubo cardiomyopathy (history of 2014)   2. Essential hypertension   3. Pulmonary HTN ([place])   4. PVC's (premature ventricular contractions)      PLAN:  In order of problems listed above:  1. Takotsubo DCM with cath showing no significant CAD.  EF has now normalized by echo 07/2013. 2. HTN - Bp controlled on current meds. Continue  amlodipine/BB/ARB. 3. Pulmonary HTN secondary to #1 and now resolved by echo 07/2013. 4. PVCs - asymptomatic. Continue BB.    Medication Adjustments/Labs and Tests Ordered: Current medicines are reviewed at length with the patient today.  Concerns regarding medicines are outlined above.  Medication changes, Labs and Tests ordered today are listed in the Patient Instructions below.  There are no Patient Instructions on file for this visit.   Signed, Fransico Him, MD  04/08/2016 11:56 AM    [address], [place], [place]  [address] Phone: [phone]; Fax: [phone]

## 2016-04-08 NOTE — Patient Instructions (Signed)
Medication Instructions:  Your physician recommends that you continue on your current medications as directed. Please refer to the Current Medication list given to you today.    Labwork: Today: BMET   Testing/Procedures: NONE.  Follow-Up: Your physician wants you to follow-up in: 1 year with Dr. Radford Pax. You will receive a reminder letter in the mail two months in advance. If you don't receive a letter, please call our office to schedule the follow-up appointment.    Any Other Special Instructions Will Be Listed Below (If Applicable).     If you need a refill on your cardiac medications before your next appointment, please call your pharmacy.

## 2016-04-09 ENCOUNTER — Telehealth: Payer: Self-pay | Admitting: Cardiology

## 2016-04-09 NOTE — Telephone Encounter (Signed)
Informed patient of results and verbal understanding expressed.  

## 2016-04-09 NOTE — Telephone Encounter (Signed)
-----   Message from Sueanne Margarita, MD sent at 04/08/2016  4:16 PM EDT ----- Please let patient know that labs were normal.  Continue current medical therapy.

## 2016-04-09 NOTE — Telephone Encounter (Signed)
New message ° ° ° ° ° ° °Pt returning nurse call  °

## 2016-04-10 ENCOUNTER — Other Ambulatory Visit: Payer: Self-pay | Admitting: Cardiology

## 2016-04-18 ENCOUNTER — Other Ambulatory Visit: Payer: Self-pay | Admitting: Cardiology

## 2016-04-18 ENCOUNTER — Other Ambulatory Visit: Payer: Self-pay | Admitting: *Deleted

## 2016-04-18 MED ORDER — CARVEDILOL 25 MG PO TABS: 25.0000 mg | ORAL_TABLET | Freq: Two times a day (BID) | ORAL | 11 refills | Status: DC

## 2016-04-22 DIAGNOSIS — E119 Type 2 diabetes mellitus without complications: Secondary | ICD-10-CM | POA: Diagnosis not present

## 2016-04-22 DIAGNOSIS — I119 Hypertensive heart disease without heart failure: Secondary | ICD-10-CM | POA: Diagnosis not present

## 2016-04-22 DIAGNOSIS — I5181 Takotsubo syndrome: Secondary | ICD-10-CM | POA: Diagnosis not present

## 2016-05-20 DIAGNOSIS — Z6820 Body mass index (BMI) 20.0-20.9, adult: Secondary | ICD-10-CM | POA: Diagnosis not present

## 2016-05-20 DIAGNOSIS — I119 Hypertensive heart disease without heart failure: Secondary | ICD-10-CM | POA: Diagnosis not present

## 2016-05-20 DIAGNOSIS — R7309 Other abnormal glucose: Secondary | ICD-10-CM | POA: Diagnosis not present

## 2016-05-20 DIAGNOSIS — J45909 Unspecified asthma, uncomplicated: Secondary | ICD-10-CM | POA: Diagnosis not present

## 2016-05-29 DIAGNOSIS — J441 Chronic obstructive pulmonary disease with (acute) exacerbation: Secondary | ICD-10-CM | POA: Diagnosis not present

## 2016-06-16 ENCOUNTER — Other Ambulatory Visit: Payer: Self-pay | Admitting: Cardiology

## 2016-07-01 DIAGNOSIS — E119 Type 2 diabetes mellitus without complications: Secondary | ICD-10-CM | POA: Diagnosis not present

## 2016-07-01 DIAGNOSIS — I252 Old myocardial infarction: Secondary | ICD-10-CM | POA: Diagnosis not present

## 2016-07-01 DIAGNOSIS — I5181 Takotsubo syndrome: Secondary | ICD-10-CM | POA: Diagnosis not present

## 2016-07-01 DIAGNOSIS — I639 Cerebral infarction, unspecified: Secondary | ICD-10-CM | POA: Diagnosis not present

## 2016-07-01 DIAGNOSIS — Z23 Encounter for immunization: Secondary | ICD-10-CM | POA: Diagnosis not present

## 2016-07-02 ENCOUNTER — Other Ambulatory Visit: Payer: Self-pay | Admitting: Cardiology

## 2016-07-02 NOTE — Telephone Encounter (Signed)
Medication Detail    Disp Refills Start End   irbesartan (AVAPRO) 300 MG tablet 60 tablet 3 06/16/2016    Sig: TAKE 1 TABLET BY MOUTH DAILY.   E-Prescribing Status: Receipt confirmed by pharmacy (06/16/2016 4:34 PM EST)   Pharmacy   [id number] - [place], [place]

## 2016-07-07 ENCOUNTER — Other Ambulatory Visit: Payer: Self-pay | Admitting: Cardiology

## 2016-09-15 DIAGNOSIS — Z0000 Encounter for general adult medical examination without abnormal findings: Secondary | ICD-10-CM | POA: Diagnosis not present

## 2016-09-15 DIAGNOSIS — I119 Hypertensive heart disease without heart failure: Secondary | ICD-10-CM | POA: Diagnosis not present

## 2016-09-15 DIAGNOSIS — I6789 Other cerebrovascular disease: Secondary | ICD-10-CM | POA: Diagnosis not present

## 2016-09-15 DIAGNOSIS — E0800 Diabetes mellitus due to underlying condition with hyperosmolarity without nonketotic hyperglycemic-hyperosmolar coma (NKHHC): Secondary | ICD-10-CM | POA: Diagnosis not present

## 2016-10-13 DIAGNOSIS — Z1231 Encounter for screening mammogram for malignant neoplasm of breast: Secondary | ICD-10-CM | POA: Diagnosis not present

## 2016-10-13 DIAGNOSIS — Z803 Family history of malignant neoplasm of breast: Secondary | ICD-10-CM | POA: Diagnosis not present

## 2016-11-26 ENCOUNTER — Ambulatory Visit (HOSPITAL_COMMUNITY)
Admission: EM | Admit: 2016-11-26 | Discharge: 2016-11-26 | Disposition: A | Discharge status: Home / Self Care | Payer: Medicare Other | Attending: Family Medicine | Admitting: Family Medicine

## 2016-11-26 ENCOUNTER — Encounter (HOSPITAL_COMMUNITY): Payer: Self-pay | Admitting: Emergency Medicine

## 2016-11-26 DIAGNOSIS — J40 Bronchitis, not specified as acute or chronic: Secondary | ICD-10-CM | POA: Diagnosis not present

## 2016-11-26 MED ORDER — BENZONATATE 100 MG PO CAPS: 100.0000 mg | ORAL_CAPSULE | Freq: Three times a day (TID) | ORAL | 0 refills | Status: DC

## 2016-11-26 MED ORDER — FLUTICASONE PROPIONATE 50 MCG/ACT NA SUSP: 2.0000 | NASAL | 0 refills | Status: DC | PRN

## 2016-11-26 MED ORDER — AZITHROMYCIN 250 MG PO TABS: 250.0000 mg | ORAL_TABLET | Freq: Every day | ORAL | 0 refills | Status: DC

## 2016-11-26 NOTE — ED Triage Notes (Signed)
Cough, cold symptoms, wheezing -symptoms started monday

## 2016-11-26 NOTE — Discharge Instructions (Signed)
Continue to take your Flonase, Claritin, Singulair, and Symbicort as prescribed. Because of her chronic lung issues, I will give you azithromycin. Take 2 tablets today, then 1 tablet daily until finished. Given you Tessalon for cough, take one tablet every 8 hours as needed. If you have no relief, follow up with her primary care provider in 3-5 days.

## 2016-11-26 NOTE — ED Provider Notes (Signed)
CSN: [id number]     Arrival date & time 11/26/16  1057 History   First MD Initiated Contact with Patient 11/26/16 1207     Chief Complaint  Patient presents with  . Cough   (Consider location/radiation/quality/duration/timing/severity/associated sxs/prior Treatment) The history is provided by the patient.  Cough  Cough characteristics:  Productive, hacking and harsh Sputum characteristics:  Yellow Severity:  Moderate Onset quality:  Gradual Duration:  3 days Timing:  Constant Progression:  Worsening Chronicity:  New Smoker: no   Context: not upper respiratory infection   Context comment:  Seasonal allergies, pulmonary HTN, Asthma Relieved by:  Nothing Worsened by:  Activity Ineffective treatments:  Beta-agonist inhaler, cough suppressants, decongestant and rest (Taking singulair, flonase, prednisone, Symbcort, Claritin) Associated symptoms: rhinorrhea, shortness of breath and wheezing   Associated symptoms: no chest pain, no chills, no eye discharge, no fever, no headaches, no myalgias and no sinus congestion     Past Medical History:  Diagnosis Date  . Asthma   . Ejection fraction   . Environmental allergies   . GERD (gastroesophageal reflux disease)   . High cholesterol   . Hypertension   . Myocardial infarction [facility])    01-10-13- [facility],cardiology -yearly visits next 5'2016, denies any further problems  . Pulmonary HTN ([place])    a. Mod pulm HTN by cath 01/2013 (PASP 79mmHg).10-30-14 tolerates walking 30 minutes contiuously x 3-4 times weekly, denies SOB or issues.  Marland Kitchen PVC's (premature ventricular contractions)    a. Noted during 01/2013 admission.  . Seasonal allergies   . Stroke [facility])    "Slurred speech, unsteady gait "01-15-2013- no residual effects.  . Takotsubo cardiomyopathy    a. NSTEMI 2/2 takotsubo 01/2013, no significant CAD by cath, EF 25%;  b. 01/2013 Echo: EF 25-30%, mod LVH, Sev HK in all segments except base. Mild/mod RV dysfxn, PASP 11mmHg.;  c.  Echo 07/2013:  normal LVF with EF 60% and normal PAP  . Uterine cancer [facility])    Past Surgical History:  Procedure Laterality Date  . ABDOMINAL HYSTERECTOMY  1999  . BALLOON DILATION N/A 11/30/2014   Procedure: BALLOON DILATION;  Surgeon: Wilford Corner, MD;  Location: [facility] ENDOSCOPY;  Service: Endoscopy;  Laterality: N/A;  . ESOPHAGOGASTRODUODENOSCOPY (EGD) WITH PROPOFOL N/A 11/01/2014   Procedure: ESOPHAGOGASTRODUODENOSCOPY (EGD) WITH PROPOFOL;  Surgeon: Laurence Spates, MD;  Location: [facility] ENDOSCOPY;  Service: Endoscopy;  Laterality: N/A;  . ESOPHAGOGASTRODUODENOSCOPY (EGD) WITH PROPOFOL N/A 11/30/2014   Procedure: ESOPHAGOGASTRODUODENOSCOPY (EGD) WITH PROPOFOL;  Surgeon: Wilford Corner, MD;  Location: [facility] ENDOSCOPY;  Service: Endoscopy;  Laterality: N/A;  . LEFT AND RIGHT HEART CATHETERIZATION WITH CORONARY ANGIOGRAM N/A 01/11/2013   Procedure: LEFT AND RIGHT HEART CATHETERIZATION WITH CORONARY ANGIOGRAM;  Surgeon: Minus Breeding, MD;  Location: [facility] CATH LAB;  Service: Cardiovascular;  Laterality: N/A;  . SAVORY DILATION N/A 11/01/2014   Procedure: SAVORY DILATION;  Surgeon: Laurence Spates, MD;  Location: [facility] ENDOSCOPY;  Service: Endoscopy;  Laterality: N/A;  . SAVORY DILATION N/A 11/30/2014   Procedure: SAVORY DILATION;  Surgeon: Wilford Corner, MD;  Location: [facility] ENDOSCOPY;  Service: Endoscopy;  Laterality: N/A;   Family History  Problem Relation Age of Onset  . Heart attack Mother   . Hypertension Mother   . Dementia Father   . Healthy Sister   . Healthy Brother   . Healthy Sister   . Healthy Sister   . Healthy Brother   . Healthy Brother    Social History  Substance Use Topics  . Smoking status: Never Smoker  .  Smokeless tobacco: Never Used  . Alcohol use No   OB History    No data available     Review of Systems  Constitutional: Negative for chills and fever.  HENT: Positive for congestion and rhinorrhea. Negative for sinus pain, sinus pressure and sneezing.   Eyes: Negative for discharge  and itching.  Respiratory: Positive for cough, shortness of breath and wheezing.   Cardiovascular: Negative for chest pain and palpitations.  Gastrointestinal: Negative.   Genitourinary: Negative.   Musculoskeletal: Negative for myalgias, neck pain and neck stiffness.  Skin: Negative.   Neurological: Negative for dizziness and headaches.    Allergies  Guaifenesin & derivatives; Cetirizine hcl; Plavix [clopidogrel bisulfate]; and Xyzal [cetirizine hcl]  Home Medications   Prior to Admission medications   Medication Sig Start Date End Date Taking? Authorizing Provider  albuterol (PROVENTIL HFA;VENTOLIN HFA) 108 (90 Base) MCG/ACT inhaler Inhale 2 puffs into the lungs every 6 (six) hours as needed for wheezing or shortness of breath.    Historical Provider, MD  alendronate (FOSAMAX) 70 MG tablet Take 70 mg by mouth every 7 (seven) days. Take with a full glass of water on an empty stomach.    Historical Provider, MD  amLODipine (NORVASC) 5 MG tablet Take 5 mg by mouth at bedtime.    Historical Provider, MD  aspirin EC 81 MG EC tablet Take 1 tablet (81 mg total) by mouth daily. 02/07/15   Carlena Bjornstad, MD  atorvastatin (LIPITOR) 40 MG tablet TAKE 1 TABLET (40 MG TOTAL) BY MOUTH DAILY AT 6 PM. 04/10/16   Sueanne Margarita, MD  azithromycin (ZITHROMAX) 250 MG tablet Take 1 tablet (250 mg total) by mouth daily. Take first 2 tablets together, then 1 every day until finished. 11/26/16   Barnet Glasgow, NP  benzonatate (TESSALON) 100 MG capsule Take 1 capsule (100 mg total) by mouth every 8 (eight) hours. 11/26/16   Barnet Glasgow, NP  carvedilol (COREG) 25 MG tablet Take 1 tablet (25 mg total) by mouth 2 (two) times daily with a meal. 04/18/16   Sueanne Margarita, MD  fluticasone (FLONASE) 50 MCG/ACT nasal spray Place 2 sprays into both nostrils as needed for allergies or rhinitis. 11/26/16   Barnet Glasgow, NP  irbesartan (AVAPRO) 300 MG tablet TAKE 1 TABLET BY MOUTH DAILY. 07/07/16   Sueanne Margarita, MD   loratadine (CLARITIN) 10 MG tablet Take 10 mg by mouth daily.      Historical Provider, MD  montelukast (SINGULAIR) 10 MG tablet Take 10 mg by mouth at bedtime.    Historical Provider, MD  predniSONE (DELTASONE) 5 MG tablet Take 2.5 mg by mouth daily.    Historical Provider, MD  SYMBICORT 160-4.5 MCG/ACT inhaler INHALE 2 PUFFS TWICE DAILY TO PREVENT COUGH OR WHEEZE. RINSE, GARGLE, AND SPIT AFTER USE. 07/31/15   Charlies Silvers, MD   Meds Ordered and Administered this Visit  Medications - No data to display  BP 112/66 (BP Location: Right Arm)   Pulse 80   Temp 99 F (37.2 C) (Oral)   Resp (!) 30   SpO2 96%  No data found.   Physical Exam  Constitutional: She is oriented to person, place, and time. She appears well-developed and well-nourished. No distress.  HENT:  Head: Normocephalic and atraumatic.  Right Ear: Tympanic membrane and external ear normal.  Left Ear: Tympanic membrane and external ear normal.  Nose: Nose normal.  Mouth/Throat: Uvula is midline, oropharynx is clear and moist and mucous membranes are  normal.  Eyes: Right eye exhibits no discharge. Left eye exhibits no discharge.  Neck: Normal range of motion.  Cardiovascular: Normal rate and regular rhythm.   Pulmonary/Chest: Effort normal and breath sounds normal.  Neurological: She is alert and oriented to person, place, and time.  Skin: Skin is warm. Capillary refill takes less than 2 seconds. She is not diaphoretic.  Psychiatric: She has a normal mood and affect. Her behavior is normal.  Nursing note and vitals reviewed.   Urgent Care Course     Procedures (including critical care time)  Labs Review Labs Reviewed - No data to display  Imaging Review No results found.   MDM   1. Bronchitis     Continue taking pulmonary medications as prescribed by primary care provider, given Tessalon for cough, started on  azithromycin, follow-up with primary care in 3-4 days as needed    Barnet Glasgow,  NP 11/26/16 1242

## 2017-01-12 DIAGNOSIS — I639 Cerebral infarction, unspecified: Secondary | ICD-10-CM | POA: Diagnosis not present

## 2017-01-12 DIAGNOSIS — J441 Chronic obstructive pulmonary disease with (acute) exacerbation: Secondary | ICD-10-CM | POA: Diagnosis not present

## 2017-01-12 DIAGNOSIS — I119 Hypertensive heart disease without heart failure: Secondary | ICD-10-CM | POA: Diagnosis not present

## 2017-01-12 DIAGNOSIS — I252 Old myocardial infarction: Secondary | ICD-10-CM | POA: Diagnosis not present

## 2017-01-12 DIAGNOSIS — E119 Type 2 diabetes mellitus without complications: Secondary | ICD-10-CM | POA: Diagnosis not present

## 2017-02-06 ENCOUNTER — Other Ambulatory Visit: Payer: Self-pay | Admitting: Cardiology

## 2017-02-06 MED ORDER — IRBESARTAN 300 MG PO TABS: 300.0000 mg | ORAL_TABLET | Freq: Every day | ORAL | 0 refills | Status: DC

## 2017-04-15 ENCOUNTER — Other Ambulatory Visit: Payer: Self-pay | Admitting: Cardiology

## 2017-04-23 ENCOUNTER — Other Ambulatory Visit: Payer: Self-pay | Admitting: Cardiology

## 2017-04-23 NOTE — Telephone Encounter (Signed)
Pt aware needs to have PMD fill med as this office has no recent lipid or liver panel ./cy

## 2017-04-23 NOTE — Telephone Encounter (Signed)
New Message.. Pt c/o medication issue:  1. Name of Medication: lipitor  2. How are you currently taking this medication (dosage and times per day)? 40mg    3. Are you having a reaction (difficulty breathing--STAT)? no  4. What is your medication issue? Per pt would like to discuss this medication reactions. Please call back to discuss

## 2017-04-28 ENCOUNTER — Other Ambulatory Visit: Payer: Self-pay | Admitting: Cardiology

## 2017-04-30 ENCOUNTER — Other Ambulatory Visit: Payer: Self-pay | Admitting: Cardiology

## 2017-04-30 MED ORDER — ATORVASTATIN CALCIUM 40 MG PO TABS: ORAL_TABLET | ORAL | 0 refills | Status: DC

## 2017-05-13 DIAGNOSIS — J441 Chronic obstructive pulmonary disease with (acute) exacerbation: Secondary | ICD-10-CM | POA: Diagnosis not present

## 2017-05-13 DIAGNOSIS — Z23 Encounter for immunization: Secondary | ICD-10-CM | POA: Diagnosis not present

## 2017-05-13 DIAGNOSIS — E119 Type 2 diabetes mellitus without complications: Secondary | ICD-10-CM | POA: Diagnosis not present

## 2017-05-27 ENCOUNTER — Other Ambulatory Visit: Payer: Self-pay | Admitting: Cardiology

## 2017-06-01 ENCOUNTER — Other Ambulatory Visit: Payer: Self-pay | Admitting: Cardiology

## 2017-06-18 ENCOUNTER — Other Ambulatory Visit: Payer: Self-pay | Admitting: Cardiology

## 2017-06-22 ENCOUNTER — Other Ambulatory Visit: Payer: Self-pay | Admitting: Cardiology

## 2017-06-24 DIAGNOSIS — I639 Cerebral infarction, unspecified: Secondary | ICD-10-CM | POA: Diagnosis not present

## 2017-06-24 DIAGNOSIS — E119 Type 2 diabetes mellitus without complications: Secondary | ICD-10-CM | POA: Diagnosis not present

## 2017-06-24 DIAGNOSIS — I119 Hypertensive heart disease without heart failure: Secondary | ICD-10-CM | POA: Diagnosis not present

## 2017-06-24 DIAGNOSIS — J441 Chronic obstructive pulmonary disease with (acute) exacerbation: Secondary | ICD-10-CM | POA: Diagnosis not present

## 2017-07-25 ENCOUNTER — Other Ambulatory Visit: Payer: Self-pay | Admitting: Cardiology

## 2017-08-12 ENCOUNTER — Other Ambulatory Visit: Payer: Self-pay | Admitting: Cardiology

## 2017-08-14 ENCOUNTER — Other Ambulatory Visit: Payer: Self-pay | Admitting: Cardiology

## 2017-08-14 MED ORDER — CARVEDILOL 25 MG PO TABS: 25.0000 mg | ORAL_TABLET | Freq: Two times a day (BID) | ORAL | 0 refills | Status: DC

## 2017-08-19 NOTE — Progress Notes (Signed)
Cardiology Office Note:    Date:  08/20/2017   ID:  Deanna Brown, DOB 08/12/1945, MRN [id number]  PCP:  Lucianne Lei, MD  Cardiologist:  No primary care provider on file.    Referring MD: Lucianne Lei, MD   Chief Complaint  Patient presents with  . Follow-up    DCM, HTN, PVCs    History of Present Illness:    Deanna Brown is a 72 y.o. female with a hx of asthma, GERD, PVCs, HTN, moderate pulmonary HTN, PVCs, Takotsubo CM with NSTEMI 2014 with initial EF 25-30% but repeat EF 6 months later by echo was 60% and pulmonary HTN had resolved.  She is here today for followup and is doing well.  He denies any chest pain or pressure, SOB, DOE, PND, orthopnea, dizziness, palpitations or syncope. She occasionally has some mild LE edema.  She is compliant with his meds and is tolerating meds with no SE.    Past Medical History:  Diagnosis Date  . Asthma   . Ejection fraction   . Environmental allergies   . GERD (gastroesophageal reflux disease)   . High cholesterol   . Hypertension   . Myocardial infarction [facility])    01-10-13- [facility],cardiology -yearly visits next 5'2016, denies any further problems  . Pulmonary HTN ([place])    a. Mod pulm HTN by cath 01/2013 (PASP 7mmHg).10-30-14 tolerates walking 30 minutes contiuously x 3-4 times weekly, denies SOB or issues.  Marland Kitchen PVC's (premature ventricular contractions)    a. Noted during 01/2013 admission.  . Seasonal allergies   . Stroke [facility])    "Slurred speech, unsteady gait "01-15-2013- no residual effects.  . Takotsubo cardiomyopathy    a. NSTEMI 2/2 takotsubo 01/2013, no significant CAD by cath, EF 25%;  b. 01/2013 Echo: EF 25-30%, mod LVH, Sev HK in all segments except base. Mild/mod RV dysfxn, PASP 21mmHg.;  c.  Echo 07/2013: normal LVF with EF 60% and normal PAP  . Uterine cancer [facility])     Past Surgical History:  Procedure Laterality Date  . ABDOMINAL HYSTERECTOMY  1999  . BALLOON DILATION N/A 11/30/2014   Procedure: BALLOON DILATION;   Surgeon: Wilford Corner, MD;  Location: [facility] ENDOSCOPY;  Service: Endoscopy;  Laterality: N/A;  . ESOPHAGOGASTRODUODENOSCOPY (EGD) WITH PROPOFOL N/A 11/01/2014   Procedure: ESOPHAGOGASTRODUODENOSCOPY (EGD) WITH PROPOFOL;  Surgeon: Laurence Spates, MD;  Location: [facility] ENDOSCOPY;  Service: Endoscopy;  Laterality: N/A;  . ESOPHAGOGASTRODUODENOSCOPY (EGD) WITH PROPOFOL N/A 11/30/2014   Procedure: ESOPHAGOGASTRODUODENOSCOPY (EGD) WITH PROPOFOL;  Surgeon: Wilford Corner, MD;  Location: [facility] ENDOSCOPY;  Service: Endoscopy;  Laterality: N/A;  . LEFT AND RIGHT HEART CATHETERIZATION WITH CORONARY ANGIOGRAM N/A 01/11/2013   Procedure: LEFT AND RIGHT HEART CATHETERIZATION WITH CORONARY ANGIOGRAM;  Surgeon: Minus Breeding, MD;  Location: [facility] CATH LAB;  Service: Cardiovascular;  Laterality: N/A;  . SAVORY DILATION N/A 11/01/2014   Procedure: SAVORY DILATION;  Surgeon: Laurence Spates, MD;  Location: [facility] ENDOSCOPY;  Service: Endoscopy;  Laterality: N/A;  . SAVORY DILATION N/A 11/30/2014   Procedure: SAVORY DILATION;  Surgeon: Wilford Corner, MD;  Location: [facility] ENDOSCOPY;  Service: Endoscopy;  Laterality: N/A;    Current Medications: Current Meds  Medication Sig  . albuterol (PROVENTIL HFA;VENTOLIN HFA) 108 (90 Base) MCG/ACT inhaler Inhale 2 puffs into the lungs every 6 (six) hours as needed for wheezing or shortness of breath.  Marland Kitchen alendronate (FOSAMAX) 70 MG tablet Take 70 mg by mouth every 7 (seven) days. Take with a full glass of water on an empty  stomach.  Marland Kitchen amLODipine (NORVASC) 5 MG tablet Take 5 mg by mouth at bedtime.  Marland Kitchen aspirin EC 81 MG EC tablet Take 1 tablet (81 mg total) by mouth daily.  Marland Kitchen atorvastatin (LIPITOR) 40 MG tablet TAKE 1 TABLET BY MOUTH AT 6 PM  . carvedilol (COREG) 25 MG tablet Take 1 tablet (25 mg total) by mouth 2 (two) times daily with a meal. Please keep upcoming appt before anymore refills. Final Attempt  . fluticasone (FLONASE) 50 MCG/ACT nasal spray Place 2 sprays into both nostrils as needed  for allergies or rhinitis.  Marland Kitchen irbesartan (AVAPRO) 300 MG tablet Take 1 tablet (300 mg total) daily by mouth. Please keep upcoming appt for future refills. Thanks  . loratadine (CLARITIN) 10 MG tablet Take 10 mg by mouth daily.    . montelukast (SINGULAIR) 10 MG tablet Take 10 mg by mouth at bedtime.  . predniSONE (DELTASONE) 5 MG tablet Take 2.5 mg by mouth daily.  . SYMBICORT 160-4.5 MCG/ACT inhaler INHALE 2 PUFFS TWICE DAILY TO PREVENT COUGH OR WHEEZE. RINSE, GARGLE, AND SPIT AFTER USE.     Allergies:   Guaifenesin & derivatives; Cetirizine hcl; Plavix [clopidogrel bisulfate]; and Xyzal [cetirizine hcl]   Social History   Socioeconomic History  . Marital status: Widowed    Spouse name: None  . Number of children: 3  . Years of education: 12th  . Highest education level: None  Social Needs  . Financial resource strain: None  . Food insecurity - worry: None  . Food insecurity - inability: None  . Transportation needs - medical: None  . Transportation needs - non-medical: None  Occupational History  . Occupation: clean Librarian, academic: [employer]  Tobacco Use  . Smoking status: Never Smoker  . Smokeless tobacco: Never Used  Substance and Sexual Activity  . Alcohol use: No  . Drug use: No  . Sexual activity: No  Other Topics Concern  . None  Social History Narrative   Lives in [place] with her son.  Her dtr also lives nearby.     Family History: The patient's family history includes Dementia in her father; Healthy in her brother, brother, brother, sister, sister, and sister; Heart attack in her mother; Hypertension in her mother.  ROS:   Please see the history of present illness.    ROS  All other systems reviewed and negative.   EKGs/Labs/Other Studies Reviewed:    The following studies were reviewed today: none  EKG:  EKG is ordered today and showed NSR at 60bpm with no ST changes  Recent Labs: No results found for requested labs within last 8760 hours.    Recent Lipid Panel    Component Value Date/Time   CHOL 71 03/15/2013 1025   TRIG 64.0 03/15/2013 1025   HDL 17.40 (L) 03/15/2013 1025   CHOLHDL 4 03/15/2013 1025   VLDL 12.8 03/15/2013 1025   LDLCALC 41 03/15/2013 1025    Physical Exam:    VS:  BP 136/77   Pulse 60   Ht 5\' 3"  (1.6 m)   Wt 132 lb 9.6 oz (60.1 kg)   BMI 23.49 kg/m     Wt Readings from Last 3 Encounters:  08/20/17 132 lb 9.6 oz (60.1 kg)  04/08/16 126 lb 9.6 oz (57.4 kg)  10/30/15 119 lb 0.8 oz (54 kg)     GEN:  Well nourished, well developed in no acute distress HEENT: Normal NECK: No JVD; No carotid bruits LYMPHATICS: No lymphadenopathy CARDIAC:  RRR, no murmurs, rubs, gallops RESPIRATORY:  Clear to auscultation without rales, wheezing or rhonchi  ABDOMEN: Soft, non-tender, non-distended MUSCULOSKELETAL:  No edema; No deformity  SKIN: Warm and dry NEUROLOGIC:  Alert and oriented x 3 PSYCHIATRIC:  Normal affect   ASSESSMENT:    1. Takotsubo cardiomyopathy (history of 2014)   2. Essential hypertension   3. PVC's (premature ventricular contractions)   4. Pulmonary HTN ([place])   5. Edema extremities    PLAN:    In order of problems listed above:  1.  Takotsubo DCM - EF has normalized by echo 07/2013.  She will continue on Carvedilol 25mg  BID and ARB. I will check a BMET today.   2.  HTN - BP is well controlled on exam today. Continue Carvedilol 25mg  BID, Irbesartan 300mg  daily and amlodipine 5mg  daily.  3.  PVC's - suppressed on BB.  She denies any palpitations.    4.  Pulmonary HTN - very mild by echo 2014.  I will repeat a 2D echo since it has been 5 years ago.    5.  LE edema - this is intermittent.  She tries to follow a low sodium diet.  I will give her a Rx for HCTZ 12.5mg  daily PRN for swelling.     Medication Adjustments/Labs and Tests Ordered: Current medicines are reviewed at length with the patient today.  Concerns regarding medicines are outlined above.  Orders Placed This  Encounter  Procedures  . EKG 12-Lead   No orders of the defined types were placed in this encounter.   Signed, Fransico Him, MD  08/20/2017 11:58 AM    [place]

## 2017-08-20 ENCOUNTER — Ambulatory Visit (INDEPENDENT_AMBULATORY_CARE_PROVIDER_SITE_OTHER): Payer: Medicare Other | Admitting: Cardiology

## 2017-08-20 ENCOUNTER — Encounter: Payer: Self-pay | Admitting: Cardiology

## 2017-08-20 VITALS — BP 136/77 | HR 60 | Ht 63.0 in | Wt 132.6 lb

## 2017-08-20 DIAGNOSIS — I10 Essential (primary) hypertension: Secondary | ICD-10-CM | POA: Diagnosis not present

## 2017-08-20 DIAGNOSIS — I493 Ventricular premature depolarization: Secondary | ICD-10-CM | POA: Diagnosis not present

## 2017-08-20 DIAGNOSIS — R600 Localized edema: Secondary | ICD-10-CM | POA: Insufficient documentation

## 2017-08-20 DIAGNOSIS — I272 Pulmonary hypertension, unspecified: Secondary | ICD-10-CM | POA: Diagnosis not present

## 2017-08-20 DIAGNOSIS — I5181 Takotsubo syndrome: Secondary | ICD-10-CM | POA: Diagnosis not present

## 2017-08-20 LAB — BASIC METABOLIC PANEL
BUN/Creatinine Ratio: 12 (ref 12–28)
BUN: 12 mg/dL (ref 8–27)
CO2: 25 mmol/L (ref 20–29)
Calcium: 9.6 mg/dL (ref 8.7–10.3)
Chloride: 103 mmol/L (ref 96–106)
Creatinine, Ser: 0.99 mg/dL (ref 0.57–1.00)
GFR calc Af Amer: 66 mL/min/{1.73_m2} (ref >59)
GFR calc non Af Amer: 58 mL/min/{1.73_m2} — ABNORMAL LOW (ref >59)
Glucose: 90 mg/dL (ref 65–99)
Potassium: 4.2 mmol/L (ref 3.5–5.2)
Sodium: 141 mmol/L (ref 134–144)

## 2017-08-20 MED ORDER — HYDROCHLOROTHIAZIDE 12.5 MG PO CAPS: 12.5000 mg | ORAL_CAPSULE | Freq: Every day | ORAL | 3 refills | Status: DC | PRN

## 2017-08-20 NOTE — Patient Instructions (Addendum)
Medication Instructions:  Your physician has recommended you make the following change in your medication:   START: HCTZ 12.5 mg (1 tab) once a day as needed for swelling   Labwork: Today for kidney function test  Testing/Procedures: Your physician has requested that you have an echocardiogram. Echocardiography is a painless test that uses sound waves to create images of your heart. It provides your doctor with information about the size and shape of your heart and how well your heart's chambers and valves are working. This procedure takes approximately one hour. There are no restrictions for this procedure.   Follow-Up: Your physician wants you to follow-up in: 1 year with Dr. Radford Pax. You will receive a reminder letter in the mail two months in advance. If you don't receive a letter, please call our office to schedule the follow-up appointment.  Any Other Special Instructions Will Be Listed Below (If Applicable).     If you need a refill on your cardiac medications before your next appointment, please call your pharmacy.

## 2017-08-24 ENCOUNTER — Other Ambulatory Visit: Payer: Self-pay | Admitting: Cardiology

## 2017-08-24 MED ORDER — HYDROCHLOROTHIAZIDE 12.5 MG PO TABS: 12.5000 mg | ORAL_TABLET | Freq: Every day | ORAL | 3 refills | Status: DC

## 2017-08-24 NOTE — Telephone Encounter (Signed)
°*  STAT* If patient is at the pharmacy, call can be transferred to refill team.   1. Which medications need to be refilled? (please list name of each medication and dose if known) Hydrochlorothiiazide 12.5 mg ( Needs the Tablet ,unable to swallow the capsules)  2. Which pharmacy/location (including street and city if local pharmacy) is medication to [facility] sent to?[facility] on [address]   3. Do they need a 30 day or 90 day supply?30  Patient has picked them up , but they were capsules and she is needs the tablets . Unable to swallow capsules, Please call if you have any questions

## 2017-08-24 NOTE — Telephone Encounter (Signed)
Pt's medication of hydrochlorothiazide 12.5 mg tablet was resent in to pt's pharmacy instead of capsules. Confirmation received.

## 2017-08-26 ENCOUNTER — Telehealth: Payer: Self-pay | Admitting: Cardiology

## 2017-08-26 NOTE — Telephone Encounter (Signed)
Returned call to patient. Patient stated she saw a report on TV stating aspirin can cause stomach bleeding. I informed patient that she only taking 81 mg once a day and to monitor for any signs of bleeding but it safe to continue to take once a day. Patient verbalized understanding and thanked me for the call.

## 2017-08-26 NOTE — Telephone Encounter (Signed)
Mrs.Abrigo is calling to find out if it is still safe for her to take the 81 mg aspirin . Please call

## 2017-09-01 ENCOUNTER — Ambulatory Visit (HOSPITAL_COMMUNITY): Payer: Medicare Other | Attending: Cardiovascular Disease

## 2017-09-01 ENCOUNTER — Other Ambulatory Visit: Payer: Self-pay

## 2017-09-01 DIAGNOSIS — I272 Pulmonary hypertension, unspecified: Secondary | ICD-10-CM | POA: Insufficient documentation

## 2017-09-01 DIAGNOSIS — I119 Hypertensive heart disease without heart failure: Secondary | ICD-10-CM | POA: Diagnosis not present

## 2017-09-01 DIAGNOSIS — R600 Localized edema: Secondary | ICD-10-CM | POA: Insufficient documentation

## 2017-09-01 DIAGNOSIS — I071 Rheumatic tricuspid insufficiency: Secondary | ICD-10-CM | POA: Insufficient documentation

## 2017-09-01 DIAGNOSIS — I43 Cardiomyopathy in diseases classified elsewhere: Secondary | ICD-10-CM | POA: Insufficient documentation

## 2017-09-07 ENCOUNTER — Telehealth: Payer: Self-pay | Admitting: Cardiology

## 2017-09-07 NOTE — Telephone Encounter (Signed)
Returned call to patient. Informed patient of echo results and lab results. All questions and concerns answered. Patient verbalized understanding and thanked me for the call.

## 2017-09-07 NOTE — Telephone Encounter (Signed)
New message ° ° ° °Patient calling for echo results. Please call °

## 2017-09-09 ENCOUNTER — Other Ambulatory Visit: Payer: Self-pay | Admitting: Cardiology

## 2017-09-14 ENCOUNTER — Other Ambulatory Visit: Payer: Self-pay | Admitting: Cardiology

## 2017-09-23 DIAGNOSIS — J301 Allergic rhinitis due to pollen: Secondary | ICD-10-CM | POA: Diagnosis not present

## 2017-09-23 DIAGNOSIS — I119 Hypertensive heart disease without heart failure: Secondary | ICD-10-CM | POA: Diagnosis not present

## 2017-09-23 DIAGNOSIS — M545 Low back pain: Secondary | ICD-10-CM | POA: Diagnosis not present

## 2017-09-28 DIAGNOSIS — Z0000 Encounter for general adult medical examination without abnormal findings: Secondary | ICD-10-CM | POA: Diagnosis not present

## 2017-10-14 DIAGNOSIS — Z1231 Encounter for screening mammogram for malignant neoplasm of breast: Secondary | ICD-10-CM | POA: Diagnosis not present

## 2018-01-21 DIAGNOSIS — E119 Type 2 diabetes mellitus without complications: Secondary | ICD-10-CM | POA: Diagnosis not present

## 2018-01-21 DIAGNOSIS — J441 Chronic obstructive pulmonary disease with (acute) exacerbation: Secondary | ICD-10-CM | POA: Diagnosis not present

## 2018-01-21 DIAGNOSIS — I119 Hypertensive heart disease without heart failure: Secondary | ICD-10-CM | POA: Diagnosis not present

## 2018-01-21 DIAGNOSIS — I6789 Other cerebrovascular disease: Secondary | ICD-10-CM | POA: Diagnosis not present

## 2018-02-08 DIAGNOSIS — J441 Chronic obstructive pulmonary disease with (acute) exacerbation: Secondary | ICD-10-CM | POA: Diagnosis not present

## 2018-02-08 DIAGNOSIS — J398 Other specified diseases of upper respiratory tract: Secondary | ICD-10-CM | POA: Diagnosis not present

## 2018-02-08 DIAGNOSIS — Z6824 Body mass index (BMI) 24.0-24.9, adult: Secondary | ICD-10-CM | POA: Diagnosis not present

## 2018-02-08 DIAGNOSIS — I119 Hypertensive heart disease without heart failure: Secondary | ICD-10-CM | POA: Diagnosis not present

## 2018-02-19 DIAGNOSIS — R05 Cough: Secondary | ICD-10-CM | POA: Diagnosis not present

## 2018-02-19 DIAGNOSIS — Z6821 Body mass index (BMI) 21.0-21.9, adult: Secondary | ICD-10-CM | POA: Diagnosis not present

## 2018-03-10 ENCOUNTER — Other Ambulatory Visit: Payer: Self-pay | Admitting: Cardiology

## 2018-03-15 ENCOUNTER — Telehealth: Payer: Self-pay | Admitting: Cardiology

## 2018-03-15 MED ORDER — LOSARTAN POTASSIUM 100 MG PO TABS: 100.0000 mg | ORAL_TABLET | Freq: Every day | ORAL | 3 refills | Status: DC

## 2018-03-15 NOTE — Telephone Encounter (Signed)
Change to Losartan 100mg  daily and have her check BP daily for a week and call with results

## 2018-03-15 NOTE — Telephone Encounter (Signed)
Please check with our phamacist to make suggestion

## 2018-03-15 NOTE — Telephone Encounter (Signed)
Called pt to see if pt contacted her pharmacy and pt stated that she did talked with her pharmacy and they stated that they could not get irbesartan, because of back order and that pt needed to contact the doctor to get an alternate for irbesartan. Please address

## 2018-03-15 NOTE — Telephone Encounter (Signed)
New Message     *STAT* If patient is at the pharmacy, call can be transferred to refill team.   1. Which medications need to be refilled? (please list name of each medication and dose if known) irbesartan (AVAPRO) 300 MG tablet  2. Which pharmacy/location (including street and city if local pharmacy) is medication to be sent to? [id number] [place] [place] - [address]  3. Do they need a 30 day or 90 day supply? 90 day

## 2018-03-15 NOTE — Telephone Encounter (Signed)
Called and made patient aware of recommendations to switch to losartan 100 mg QD and monitor BP x 1 week after change and call back with readings. Patient verbalized understanding and thanked me for the call. Rx sent to preferred pharmacy.

## 2018-03-15 NOTE — Telephone Encounter (Signed)
Pt on irbesartan for HTN. She has previously been on valsartan which was discontinued in 2014. Would recommend change to losartan 100mg  daily. If able monitor blood pressure for 3-4 weeks after change and call with changes.

## 2018-03-23 ENCOUNTER — Telehealth: Payer: Self-pay | Admitting: Cardiology

## 2018-03-23 NOTE — Telephone Encounter (Signed)
Will forward  BP readings below to Dr. Radford Pax for review (recent med change from irbesartan to losartan, see telephone encounter 8/12).

## 2018-03-23 NOTE — Telephone Encounter (Signed)
New message  Per Dr. Radford Pax patient is calling back with 5 blood pressure readings: 03/17/2018 117/61bp hr 66  128/78 69 hr     03/18/2018 134/80 80 hr      03/19/2018 128/72 70 hr  116/66 69 hr    03/20/2018   145/82 72 hr         03/21/2018 121/70 66 hr   03/22/2018   116/70 67 hr  03/23/18 126/75 70 hr 123/72 69hr

## 2018-03-26 NOTE — Telephone Encounter (Signed)
Good BP readings - continue current meds 

## 2018-03-29 NOTE — Telephone Encounter (Signed)
Patient aware of Dr. Theodosia Blender response to her BP's. Per Dr. Radford Pax, good BP readings- continue current meds. Patient verbalized understanding.

## 2018-04-20 DIAGNOSIS — J398 Other specified diseases of upper respiratory tract: Secondary | ICD-10-CM | POA: Diagnosis not present

## 2018-04-20 DIAGNOSIS — E119 Type 2 diabetes mellitus without complications: Secondary | ICD-10-CM | POA: Diagnosis not present

## 2018-04-20 DIAGNOSIS — I119 Hypertensive heart disease without heart failure: Secondary | ICD-10-CM | POA: Diagnosis not present

## 2018-04-20 DIAGNOSIS — I6789 Other cerebrovascular disease: Secondary | ICD-10-CM | POA: Diagnosis not present

## 2018-04-20 DIAGNOSIS — J441 Chronic obstructive pulmonary disease with (acute) exacerbation: Secondary | ICD-10-CM | POA: Diagnosis not present

## 2018-04-21 DIAGNOSIS — D649 Anemia, unspecified: Secondary | ICD-10-CM | POA: Diagnosis not present

## 2018-04-21 DIAGNOSIS — I119 Hypertensive heart disease without heart failure: Secondary | ICD-10-CM | POA: Diagnosis not present

## 2018-04-21 DIAGNOSIS — Z6820 Body mass index (BMI) 20.0-20.9, adult: Secondary | ICD-10-CM | POA: Diagnosis not present

## 2018-04-21 DIAGNOSIS — E119 Type 2 diabetes mellitus without complications: Secondary | ICD-10-CM | POA: Diagnosis not present

## 2018-05-03 DIAGNOSIS — Z1212 Encounter for screening for malignant neoplasm of rectum: Secondary | ICD-10-CM | POA: Diagnosis not present

## 2018-05-03 DIAGNOSIS — Z1211 Encounter for screening for malignant neoplasm of colon: Secondary | ICD-10-CM | POA: Diagnosis not present

## 2018-05-24 DIAGNOSIS — D649 Anemia, unspecified: Secondary | ICD-10-CM | POA: Diagnosis not present

## 2018-05-24 DIAGNOSIS — D529 Folate deficiency anemia, unspecified: Secondary | ICD-10-CM | POA: Diagnosis not present

## 2018-08-21 ENCOUNTER — Other Ambulatory Visit: Payer: Self-pay | Admitting: Cardiology

## 2018-08-25 DIAGNOSIS — E119 Type 2 diabetes mellitus without complications: Secondary | ICD-10-CM | POA: Diagnosis not present

## 2018-08-25 DIAGNOSIS — I5181 Takotsubo syndrome: Secondary | ICD-10-CM | POA: Diagnosis not present

## 2018-08-25 DIAGNOSIS — I252 Old myocardial infarction: Secondary | ICD-10-CM | POA: Diagnosis not present

## 2018-08-25 DIAGNOSIS — Z0000 Encounter for general adult medical examination without abnormal findings: Secondary | ICD-10-CM | POA: Diagnosis not present

## 2018-09-15 ENCOUNTER — Encounter: Payer: Self-pay | Admitting: Cardiology

## 2018-09-26 ENCOUNTER — Other Ambulatory Visit: Payer: Self-pay | Admitting: Cardiology

## 2018-10-04 ENCOUNTER — Ambulatory Visit (INDEPENDENT_AMBULATORY_CARE_PROVIDER_SITE_OTHER): Payer: Medicare Other | Admitting: Cardiology

## 2018-10-04 VITALS — BP 110/66 | HR 62 | Ht 63.0 in | Wt 126.0 lb

## 2018-10-04 DIAGNOSIS — I5181 Takotsubo syndrome: Secondary | ICD-10-CM

## 2018-10-04 DIAGNOSIS — R600 Localized edema: Secondary | ICD-10-CM

## 2018-10-04 DIAGNOSIS — I10 Essential (primary) hypertension: Secondary | ICD-10-CM | POA: Diagnosis not present

## 2018-10-04 DIAGNOSIS — I493 Ventricular premature depolarization: Secondary | ICD-10-CM | POA: Diagnosis not present

## 2018-10-04 LAB — BASIC METABOLIC PANEL
BUN/Creatinine Ratio: 13 (ref 12–28)
BUN: 12 mg/dL (ref 8–27)
CO2: 24 mmol/L (ref 20–29)
Calcium: 9.1 mg/dL (ref 8.7–10.3)
Chloride: 103 mmol/L (ref 96–106)
Creatinine, Ser: 0.94 mg/dL (ref 0.57–1.00)
GFR calc Af Amer: 70 mL/min/{1.73_m2} (ref >59)
GFR calc non Af Amer: 61 mL/min/{1.73_m2} (ref >59)
Glucose: 88 mg/dL (ref 65–99)
Potassium: 3.5 mmol/L (ref 3.5–5.2)
Sodium: 141 mmol/L (ref 134–144)

## 2018-10-04 NOTE — Progress Notes (Signed)
Cardiology Office Note:    Date:  10/04/2018   ID:  Deanna Brown, DOB August 02, 1946, MRN [id number]  PCP:  Lucianne Lei, MD  Cardiologist:  No primary care provider on file.    Referring MD: Lucianne Lei, MD   Chief Complaint  Patient presents with  . Hypertension  . Cardiomyopathy    History of Present Illness:    Deanna Brown is a 73 y.o. female with a hx of  asthma, GERD, PVCs, HTN, moderate pulmonary HTN, PVCs, Takotsubo CM with NSTEMI 2014 with initial EF 25-30% but repeat EF 6 months later by echo was 60% and pulmonary HTN had resolved.  She is here today for followup and is doing well.  She denies any chest pain or pressure, SOB, DOE, PND, orthopnea, LE edema, dizziness, palpitations or syncope. She is compliant with her meds and is tolerating meds with no SE.    Past Medical History:  Diagnosis Date  . Asthma   . Ejection fraction   . Environmental allergies   . GERD (gastroesophageal reflux disease)   . High cholesterol   . Hypertension   . Myocardial infarction [facility])    01-10-13- [facility],cardiology -yearly visits next 5'2016, denies any further problems  . Pulmonary HTN ([place])    a. Mod pulm HTN by cath 01/2013 (PASP 74mmHg).10-30-14 tolerates walking 30 minutes contiuously x 3-4 times weekly, denies SOB or issues.  Marland Kitchen PVC's (premature ventricular contractions)    a. Noted during 01/2013 admission.  . Seasonal allergies   . Stroke [facility])    "Slurred speech, unsteady gait "01-15-2013- no residual effects.  . Takotsubo cardiomyopathy    a. NSTEMI 2/2 takotsubo 01/2013, no significant CAD by cath, EF 25%;  b. 01/2013 Echo: EF 25-30%, mod LVH, Sev HK in all segments except base. Mild/mod RV dysfxn, PASP 65mmHg.;  c.  Echo 07/2013: normal LVF with EF 60% and normal PAP  . Uterine cancer [facility])     Past Surgical History:  Procedure Laterality Date  . ABDOMINAL HYSTERECTOMY  1999  . BALLOON DILATION N/A 11/30/2014   Procedure: BALLOON DILATION;  Surgeon: Wilford Corner, MD;   Location: [facility] ENDOSCOPY;  Service: Endoscopy;  Laterality: N/A;  . ESOPHAGOGASTRODUODENOSCOPY (EGD) WITH PROPOFOL N/A 11/01/2014   Procedure: ESOPHAGOGASTRODUODENOSCOPY (EGD) WITH PROPOFOL;  Surgeon: Laurence Spates, MD;  Location: [facility] ENDOSCOPY;  Service: Endoscopy;  Laterality: N/A;  . ESOPHAGOGASTRODUODENOSCOPY (EGD) WITH PROPOFOL N/A 11/30/2014   Procedure: ESOPHAGOGASTRODUODENOSCOPY (EGD) WITH PROPOFOL;  Surgeon: Wilford Corner, MD;  Location: [facility] ENDOSCOPY;  Service: Endoscopy;  Laterality: N/A;  . LEFT AND RIGHT HEART CATHETERIZATION WITH CORONARY ANGIOGRAM N/A 01/11/2013   Procedure: LEFT AND RIGHT HEART CATHETERIZATION WITH CORONARY ANGIOGRAM;  Surgeon: Minus Breeding, MD;  Location: [facility] CATH LAB;  Service: Cardiovascular;  Laterality: N/A;  . SAVORY DILATION N/A 11/01/2014   Procedure: SAVORY DILATION;  Surgeon: Laurence Spates, MD;  Location: [facility] ENDOSCOPY;  Service: Endoscopy;  Laterality: N/A;  . SAVORY DILATION N/A 11/30/2014   Procedure: SAVORY DILATION;  Surgeon: Wilford Corner, MD;  Location: [facility] ENDOSCOPY;  Service: Endoscopy;  Laterality: N/A;    Current Medications: Current Meds  Medication Sig  . alendronate (FOSAMAX) 70 MG tablet Take 70 mg by mouth every 7 (seven) days. Take with a full glass of water on an empty stomach.  Marland Kitchen amLODipine (NORVASC) 5 MG tablet Take 5 mg by mouth at bedtime.  Marland Kitchen aspirin EC 81 MG EC tablet Take 1 tablet (81 mg total) by mouth daily.  Marland Kitchen atorvastatin (LIPITOR) 40  MG tablet TAKE 1 TABLET BY MOUTH AT 6 PM  . carvedilol (COREG) 25 MG tablet Take 1 tablet (25 mg total) by mouth 2 (two) times daily with a meal.  . fluticasone (FLONASE) 50 MCG/ACT nasal spray Place 2 sprays into both nostrils as needed for allergies or rhinitis.  . hydrochlorothiazide (HYDRODIURIL) 12.5 MG tablet Take 1 tablet (12.5 mg total) by mouth daily as needed (for swelling).  Marland Kitchen loratadine (CLARITIN) 10 MG tablet Take 10 mg by mouth daily.    . montelukast (SINGULAIR) 10 MG tablet Take  10 mg by mouth at bedtime.  . predniSONE (DELTASONE) 5 MG tablet Take 2.5 mg by mouth daily.  . SYMBICORT 160-4.5 MCG/ACT inhaler INHALE 2 PUFFS TWICE DAILY TO PREVENT COUGH OR WHEEZE. RINSE, GARGLE, AND SPIT AFTER USE.     Allergies:   Guaifenesin & derivatives; Cetirizine hcl; Plavix [clopidogrel bisulfate]; and Xyzal [cetirizine hcl]   Social History   Socioeconomic History  . Marital status: Widowed    Spouse name: Not on file  . Number of children: 3  . Years of education: 12th  . Highest education level: Not on file  Occupational History  . Occupation: clean Librarian, academic: [employer]  . Financial resource strain: Not on file  . Food insecurity:    Worry: Not on file    Inability: Not on file  . Transportation needs:    Medical: Not on file    Non-medical: Not on file  Tobacco Use  . Smoking status: Never Smoker  . Smokeless tobacco: Never Used  Substance and Sexual Activity  . Alcohol use: No  . Drug use: No  . Sexual activity: Never  Lifestyle  . Physical activity:    Days per week: Not on file    Minutes per session: Not on file  . Stress: Not on file  Relationships  . Social connections:    Talks on phone: Not on file    Gets together: Not on file    Attends religious service: Not on file    Active member of club or organization: Not on file    Attends meetings of clubs or organizations: Not on file    Relationship status: Not on file  Other Topics Concern  . Not on file  Social History Narrative   Lives in [place] with her son.  Her dtr also lives nearby.     Family History: The patient's family history includes Dementia in her father; Healthy in her brother, brother, brother, sister, sister, and sister; Heart attack in her mother; Hypertension in her mother.  ROS:   Please see the history of present illness.    ROS  All other systems reviewed and negative.   EKGs/Labs/Other Studies Reviewed:    The following studies were  reviewed today: none  EKG:  EKG is  ordered today.  The ekg ordered today demonstrates normal sinus rhythm with LVH by voltage criteria.  Recent Labs: No results found for requested labs within last 8760 hours.   Recent Lipid Panel    Component Value Date/Time   CHOL 71 03/15/2013 1025   TRIG 64.0 03/15/2013 1025   HDL 17.40 (L) 03/15/2013 1025   CHOLHDL 4 03/15/2013 1025   VLDL 12.8 03/15/2013 1025   LDLCALC 41 03/15/2013 1025    Physical Exam:    VS:  BP 110/66   Pulse 62   Ht 5\' 3"  (1.6 m)   Wt 126 lb (57.2 kg)  BMI 22.32 kg/m     Wt Readings from Last 3 Encounters:  10/04/18 126 lb (57.2 kg)  08/20/17 132 lb 9.6 oz (60.1 kg)  04/08/16 126 lb 9.6 oz (57.4 kg)     GEN:  Well nourished, well developed in no acute distress HEENT: Normal NECK: No JVD; No carotid bruits LYMPHATICS: No lymphadenopathy CARDIAC: RRR, no murmurs, rubs, gallops RESPIRATORY:  Clear to auscultation without rales, wheezing or rhonchi  ABDOMEN: Soft, non-tender, non-distended MUSCULOSKELETAL:  No edema; No deformity  SKIN: Warm and dry NEUROLOGIC:  Alert and oriented x 3 PSYCHIATRIC:  Normal affect   ASSESSMENT:    1. Takotsubo cardiomyopathy (history of 2014)   2. Essential hypertension   3. PVC's (premature ventricular contractions)   4. Edema of extremities    PLAN:    In order of problems listed above:  1.  Takotsubo DCM - s/p NSTEMI 2014 with EF 25 to 30% and essentially normal coronary arteries on cardiac cath with normalization of LV function on follow-up echo.  2.  Hypertension - BP is well controlled on exam today.  She will continue on amlodipine 5 mg daily, carvedilol 25 mg twice daily, Cozaar 100 mg daily.  I will repeat a bmet today.  3.  PVCs -  well controlled on beta-blocker therapy.  4.  Chronic lower extremity edema  - this very well controlled on HCTZ.  She will continue on this.  Medication Adjustments/Labs and Tests Ordered: Current medicines are reviewed  at length with the patient today.  Concerns regarding medicines are outlined above.  Orders Placed This Encounter  Procedures  . EKG 12-Lead   No orders of the defined types were placed in this encounter.   Signed, Fransico Him, MD  10/04/2018 10:27 AM    [place]

## 2018-10-04 NOTE — Patient Instructions (Signed)
Medication Instructions:  Your physician recommends that you continue on your current medications as directed. Please refer to the Current Medication list given to you today.  If you need a refill on your cardiac medications before your next appointment, please call your pharmacy.   Lab work: Today: BMET  If you have labs (blood work) drawn today and your tests are completely normal, you will receive your results only by: Marland Kitchen MyChart Message (if you have MyChart) OR . A paper copy in the mail If you have any lab test that is abnormal or we need to change your treatment, we will call you to review the results.  Testing/Procedures: None  Follow-Up: At [facility], you and your health needs are our priority.  As part of our continuing mission to provide you with exceptional heart care, we have created designated Provider Care Teams.  These Care Teams include your primary Cardiologist (physician) and Advanced Practice Providers (APPs -  Physician Assistants and Nurse Practitioners) who all work together to provide you with the care you need, when you need it. You will need a follow up appointment in 1 years.  Please call our office 2 months in advance to schedule this appointment.  You may see Dr. Radford Pax or one of the following Advanced Practice Providers on your designated Care Team:   [place], PA-C Melina Copa, PA-C . Ermalinda Barrios, PA-C

## 2018-10-05 ENCOUNTER — Other Ambulatory Visit: Payer: Self-pay

## 2018-10-05 ENCOUNTER — Telehealth: Payer: Self-pay

## 2018-10-05 DIAGNOSIS — E876 Hypokalemia: Secondary | ICD-10-CM

## 2018-10-05 DIAGNOSIS — R600 Localized edema: Secondary | ICD-10-CM

## 2018-10-05 MED ORDER — POTASSIUM CHLORIDE CRYS ER 20 MEQ PO TBCR: 20.0000 meq | EXTENDED_RELEASE_TABLET | Freq: Every day | ORAL | 6 refills | Status: DC

## 2018-10-05 NOTE — Telephone Encounter (Signed)
Pt verbalized understanding of her lab results and will start the KDUR 97meq today and will return 10/13/18 for repeat labs.

## 2018-10-05 NOTE — Progress Notes (Signed)
bmet  

## 2018-10-05 NOTE — Telephone Encounter (Signed)
-----   Message from Sueanne Margarita, MD sent at 10/04/2018  5:33 PM EST ----- Potassium low normal - please start [place] 56meq daily and repeat BMET in 1 week

## 2018-10-13 ENCOUNTER — Other Ambulatory Visit: Payer: Medicare Other | Admitting: *Deleted

## 2018-10-13 ENCOUNTER — Other Ambulatory Visit: Payer: Self-pay

## 2018-10-13 DIAGNOSIS — E876 Hypokalemia: Secondary | ICD-10-CM

## 2018-10-13 DIAGNOSIS — R600 Localized edema: Secondary | ICD-10-CM | POA: Diagnosis not present

## 2018-10-14 LAB — BASIC METABOLIC PANEL
BUN/Creatinine Ratio: 15 (ref 12–28)
BUN: 16 mg/dL (ref 8–27)
CO2: 24 mmol/L (ref 20–29)
Calcium: 9.7 mg/dL (ref 8.7–10.3)
Chloride: 102 mmol/L (ref 96–106)
Creatinine, Ser: 1.1 mg/dL — ABNORMAL HIGH (ref 0.57–1.00)
GFR calc Af Amer: 58 mL/min/{1.73_m2} — ABNORMAL LOW (ref >59)
GFR calc non Af Amer: 50 mL/min/{1.73_m2} — ABNORMAL LOW (ref >59)
Glucose: 94 mg/dL (ref 65–99)
Potassium: 3.9 mmol/L (ref 3.5–5.2)
Sodium: 142 mmol/L (ref 134–144)

## 2018-10-20 DIAGNOSIS — Z1231 Encounter for screening mammogram for malignant neoplasm of breast: Secondary | ICD-10-CM | POA: Diagnosis not present

## 2018-10-20 DIAGNOSIS — M810 Age-related osteoporosis without current pathological fracture: Secondary | ICD-10-CM | POA: Diagnosis not present

## 2018-10-20 DIAGNOSIS — J45909 Unspecified asthma, uncomplicated: Secondary | ICD-10-CM | POA: Diagnosis not present

## 2018-10-25 ENCOUNTER — Other Ambulatory Visit: Payer: Self-pay | Admitting: Cardiology

## 2018-11-18 ENCOUNTER — Other Ambulatory Visit: Payer: Self-pay | Admitting: Cardiology

## 2019-02-04 DIAGNOSIS — D649 Anemia, unspecified: Secondary | ICD-10-CM | POA: Diagnosis not present

## 2019-02-04 DIAGNOSIS — E1169 Type 2 diabetes mellitus with other specified complication: Secondary | ICD-10-CM | POA: Diagnosis not present

## 2019-02-04 DIAGNOSIS — D529 Folate deficiency anemia, unspecified: Secondary | ICD-10-CM | POA: Diagnosis not present

## 2019-02-04 DIAGNOSIS — I119 Hypertensive heart disease without heart failure: Secondary | ICD-10-CM | POA: Diagnosis not present

## 2019-02-25 ENCOUNTER — Other Ambulatory Visit: Payer: Self-pay | Admitting: Cardiology

## 2019-03-01 ENCOUNTER — Other Ambulatory Visit: Payer: Self-pay | Admitting: Cardiology

## 2019-03-01 MED ORDER — LOSARTAN POTASSIUM 100 MG PO TABS: 100.0000 mg | ORAL_TABLET | Freq: Every day | ORAL | 2 refills | Status: DC

## 2019-03-01 NOTE — Telephone Encounter (Signed)
Pt's medication was sent to pt's pharmacy as requested. Confirmation received.  °

## 2019-03-10 ENCOUNTER — Other Ambulatory Visit: Payer: Self-pay

## 2019-03-10 ENCOUNTER — Telehealth: Payer: Self-pay | Admitting: Cardiology

## 2019-03-10 NOTE — Telephone Encounter (Signed)
Spoke with patient who states she spoke with [facility] and they have filled her prescription. I advised her if this problem arises in the future, to call back to let us know a different pharmacy where we can send the Rx. She verbalized understanding and agreement and thanked me for the call.

## 2019-03-10 NOTE — Telephone Encounter (Signed)
Pt calling stating that her pharmacy is telling her that her medication Losartan is unavailable and pt can not get medication. Pt would like a call back concerning this matter. Please address

## 2019-03-10 NOTE — Telephone Encounter (Signed)
Please see if PharmD can find another pharmacy to get it from

## 2019-03-10 NOTE — Telephone Encounter (Signed)
[facility] is the only [facility] that cannot currently get losartan - would recommend sending in to any other pharmacy chain that pt prefers.

## 2019-07-12 DIAGNOSIS — I5181 Takotsubo syndrome: Secondary | ICD-10-CM | POA: Diagnosis not present

## 2019-07-12 DIAGNOSIS — D649 Anemia, unspecified: Secondary | ICD-10-CM | POA: Diagnosis not present

## 2019-07-12 DIAGNOSIS — E1169 Type 2 diabetes mellitus with other specified complication: Secondary | ICD-10-CM | POA: Diagnosis not present

## 2019-07-12 DIAGNOSIS — J441 Chronic obstructive pulmonary disease with (acute) exacerbation: Secondary | ICD-10-CM | POA: Diagnosis not present

## 2019-07-12 DIAGNOSIS — I119 Hypertensive heart disease without heart failure: Secondary | ICD-10-CM | POA: Diagnosis not present

## 2019-07-13 DIAGNOSIS — R7309 Other abnormal glucose: Secondary | ICD-10-CM | POA: Diagnosis not present

## 2019-07-13 DIAGNOSIS — E1169 Type 2 diabetes mellitus with other specified complication: Secondary | ICD-10-CM | POA: Diagnosis not present

## 2019-07-13 DIAGNOSIS — J441 Chronic obstructive pulmonary disease with (acute) exacerbation: Secondary | ICD-10-CM | POA: Diagnosis not present

## 2019-08-31 ENCOUNTER — Ambulatory Visit (INDEPENDENT_AMBULATORY_CARE_PROVIDER_SITE_OTHER): Payer: Medicare Other

## 2019-08-31 ENCOUNTER — Encounter (HOSPITAL_COMMUNITY): Payer: Self-pay | Admitting: Emergency Medicine

## 2019-08-31 ENCOUNTER — Ambulatory Visit (HOSPITAL_COMMUNITY)
Admission: EM | Admit: 2019-08-31 | Discharge: 2019-08-31 | Disposition: A | Discharge status: Home / Self Care | Payer: Medicare Other | Attending: Family Medicine | Admitting: Family Medicine

## 2019-08-31 ENCOUNTER — Other Ambulatory Visit: Payer: Self-pay

## 2019-08-31 DIAGNOSIS — Z20822 Contact with and (suspected) exposure to covid-19: Secondary | ICD-10-CM | POA: Diagnosis not present

## 2019-08-31 DIAGNOSIS — Z79899 Other long term (current) drug therapy: Secondary | ICD-10-CM | POA: Insufficient documentation

## 2019-08-31 DIAGNOSIS — I252 Old myocardial infarction: Secondary | ICD-10-CM | POA: Insufficient documentation

## 2019-08-31 DIAGNOSIS — R591 Generalized enlarged lymph nodes: Secondary | ICD-10-CM

## 2019-08-31 DIAGNOSIS — M542 Cervicalgia: Secondary | ICD-10-CM

## 2019-08-31 DIAGNOSIS — Z8542 Personal history of malignant neoplasm of other parts of uterus: Secondary | ICD-10-CM | POA: Diagnosis not present

## 2019-08-31 DIAGNOSIS — Z8249 Family history of ischemic heart disease and other diseases of the circulatory system: Secondary | ICD-10-CM | POA: Diagnosis not present

## 2019-08-31 DIAGNOSIS — E7800 Pure hypercholesterolemia, unspecified: Secondary | ICD-10-CM | POA: Insufficient documentation

## 2019-08-31 DIAGNOSIS — M50322 Other cervical disc degeneration at C5-C6 level: Secondary | ICD-10-CM | POA: Insufficient documentation

## 2019-08-31 DIAGNOSIS — Z7982 Long term (current) use of aspirin: Secondary | ICD-10-CM | POA: Diagnosis not present

## 2019-08-31 DIAGNOSIS — R590 Localized enlarged lymph nodes: Secondary | ICD-10-CM | POA: Diagnosis not present

## 2019-08-31 DIAGNOSIS — Z7951 Long term (current) use of inhaled steroids: Secondary | ICD-10-CM | POA: Diagnosis not present

## 2019-08-31 DIAGNOSIS — Z7952 Long term (current) use of systemic steroids: Secondary | ICD-10-CM | POA: Diagnosis not present

## 2019-08-31 DIAGNOSIS — J45909 Unspecified asthma, uncomplicated: Secondary | ICD-10-CM | POA: Diagnosis not present

## 2019-08-31 DIAGNOSIS — M436 Torticollis: Secondary | ICD-10-CM | POA: Diagnosis not present

## 2019-08-31 DIAGNOSIS — Z8673 Personal history of transient ischemic attack (TIA), and cerebral infarction without residual deficits: Secondary | ICD-10-CM | POA: Insufficient documentation

## 2019-08-31 DIAGNOSIS — I10 Essential (primary) hypertension: Secondary | ICD-10-CM | POA: Insufficient documentation

## 2019-08-31 LAB — CBC WITH DIFFERENTIAL/PLATELET
Abs Immature Granulocytes: 0.03 10*3/uL (ref 0.00–0.07)
Basophils Absolute: 0 10*3/uL (ref 0.0–0.1)
Basophils Relative: 1 %
Eosinophils Absolute: 0.2 10*3/uL (ref 0.0–0.5)
Eosinophils Relative: 2 %
HCT: 35.4 % — ABNORMAL LOW (ref 36.0–46.0)
Hemoglobin: 11.7 g/dL — ABNORMAL LOW (ref 12.0–15.0)
Immature Granulocytes: 0 %
Lymphocytes Relative: 17 %
Lymphs Abs: 1.5 10*3/uL (ref 0.7–4.0)
MCH: 29 pg (ref 26.0–34.0)
MCHC: 33.1 g/dL (ref 30.0–36.0)
MCV: 87.6 fL (ref 80.0–100.0)
Monocytes Absolute: 1 10*3/uL (ref 0.1–1.0)
Monocytes Relative: 11 %
Neutro Abs: 6.1 10*3/uL (ref 1.7–7.7)
Neutrophils Relative %: 69 %
Platelets: 232 10*3/uL (ref 150–400)
RBC: 4.04 MIL/uL (ref 3.87–5.11)
RDW: 13 % (ref 11.5–15.5)
WBC: 8.8 10*3/uL (ref 4.0–10.5)
nRBC: 0 % (ref 0.0–0.2)

## 2019-08-31 LAB — COMPREHENSIVE METABOLIC PANEL
ALT: 10 U/L (ref 0–44)
AST: 13 U/L — ABNORMAL LOW (ref 15–41)
Albumin: 3.6 g/dL (ref 3.5–5.0)
Alkaline Phosphatase: 51 U/L (ref 38–126)
Anion gap: 10 (ref 5–15)
BUN: 15 mg/dL (ref 8–23)
CO2: 25 mmol/L (ref 22–32)
Calcium: 9.4 mg/dL (ref 8.9–10.3)
Chloride: 102 mmol/L (ref 98–111)
Creatinine, Ser: 1.38 mg/dL — ABNORMAL HIGH (ref 0.44–1.00)
GFR calc Af Amer: 44 mL/min — ABNORMAL LOW (ref >60)
GFR calc non Af Amer: 38 mL/min — ABNORMAL LOW (ref >60)
Glucose, Bld: 109 mg/dL — ABNORMAL HIGH (ref 70–99)
Potassium: 3.3 mmol/L — ABNORMAL LOW (ref 3.5–5.1)
Sodium: 137 mmol/L (ref 135–145)
Total Bilirubin: 0.9 mg/dL (ref 0.3–1.2)
Total Protein: 7.2 g/dL (ref 6.5–8.1)

## 2019-08-31 NOTE — ED Triage Notes (Signed)
Neck pain that is worse with movement and left shoulder pain for 2 days. No injury.

## 2019-08-31 NOTE — Discharge Instructions (Addendum)
Your neck x-ray did not show anything concerning. I will call you if any of your blood work comes back abnormal This could be some sort of viral illness causing the body aches and lymph node swelling. If this problem continues or worsens you will need to follow-up with your primary care.

## 2019-08-31 NOTE — ED Provider Notes (Signed)
[place]    CSN: [id number] Arrival date & time: 08/31/19  1016      History   Chief Complaint Chief Complaint  Patient presents with  . Torticollis    HPI Deanna Brown is a 74 y.o. female.   Patient is a 74 year old female past medical history of asthma, allergies, GERD, hypertension, cholesterol, MI, pulmonary hypertension, PVC, stroke, seasonal allergies, uterine cancer.  She presents today with approximately 2 days of constant, waxing and waning upper back/neck pain.  The pain is worsened with movement and palpation.  Denies any falls, injuries, heavy lifting, exercising.  Patient also has swollen lymph nodes to posterior neck and cervical area.  Noticed these around the same time.  Denies any fever, chills, body aches, cough, congestion, rhinorrhea, sore throat, ear pain, nausea, vomiting, diarrhea, headaches or dizziness.  ROS per HPI      Past Medical History:  Diagnosis Date  . Asthma   . Ejection fraction   . Environmental allergies   . GERD (gastroesophageal reflux disease)   . High cholesterol   . Hypertension   . Myocardial infarction [facility])    01-10-13- [facility],cardiology -yearly visits next 5'2016, denies any further problems  . Pulmonary HTN ([place])    a. Mod pulm HTN by cath 01/2013 (PASP 45mmHg).10-30-14 tolerates walking 30 minutes contiuously x 3-4 times weekly, denies SOB or issues.  Marland Kitchen PVC's (premature ventricular contractions)    a. Noted during 01/2013 admission.  . Seasonal allergies   . Stroke [facility])    "Slurred speech, unsteady gait "01-15-2013- no residual effects.  . Takotsubo cardiomyopathy    a. NSTEMI 2/2 takotsubo 01/2013, no significant CAD by cath, EF 25%;  b. 01/2013 Echo: EF 25-30%, mod LVH, Sev HK in all segments except base. Mild/mod RV dysfxn, PASP 75mmHg.;  c.  Echo 07/2013: normal LVF with EF 60% and normal PAP  . Uterine cancer [facility])     Patient Active Problem List   Diagnosis Date Noted  . Edema of extremities 08/20/2017   . Asthma exacerbation 10/30/2015  . Acute respiratory failure with hypoxia ([place]) 10/30/2015  . Acute asthma exacerbation 10/30/2015  . Neck pain on left side 03/23/2015  . Stricture and stenosis of esophagus 11/30/2014  . Difficulty in swallowing 11/30/2014  . Ejection fraction   . Aortic valve sclerosis 07/12/2013  . History of CVA (cerebrovascular accident) 06/13/2013  . PVC's (premature ventricular contractions)   . Uterine cancer ([place])   . High cholesterol   . Hypertension   . Takotsubo cardiomyopathy (history of 2014)   . GERD (gastroesophageal reflux disease) 01/15/2013    Past Surgical History:  Procedure Laterality Date  . ABDOMINAL HYSTERECTOMY  1999  . BALLOON DILATION N/A 11/30/2014   Procedure: BALLOON DILATION;  Surgeon: Wilford Corner, MD;  Location: [facility] ENDOSCOPY;  Service: Endoscopy;  Laterality: N/A;  . ESOPHAGOGASTRODUODENOSCOPY (EGD) WITH PROPOFOL N/A 11/01/2014   Procedure: ESOPHAGOGASTRODUODENOSCOPY (EGD) WITH PROPOFOL;  Surgeon: Laurence Spates, MD;  Location: [facility] ENDOSCOPY;  Service: Endoscopy;  Laterality: N/A;  . ESOPHAGOGASTRODUODENOSCOPY (EGD) WITH PROPOFOL N/A 11/30/2014   Procedure: ESOPHAGOGASTRODUODENOSCOPY (EGD) WITH PROPOFOL;  Surgeon: Wilford Corner, MD;  Location: [facility] ENDOSCOPY;  Service: Endoscopy;  Laterality: N/A;  . LEFT AND RIGHT HEART CATHETERIZATION WITH CORONARY ANGIOGRAM N/A 01/11/2013   Procedure: LEFT AND RIGHT HEART CATHETERIZATION WITH CORONARY ANGIOGRAM;  Surgeon: Minus Breeding, MD;  Location: [facility] CATH LAB;  Service: Cardiovascular;  Laterality: N/A;  . SAVORY DILATION N/A 11/01/2014   Procedure: SAVORY DILATION;  Surgeon:  Laurence Spates, MD;  Location: [facility] ENDOSCOPY;  Service: Endoscopy;  Laterality: N/A;  . SAVORY DILATION N/A 11/30/2014   Procedure: SAVORY DILATION;  Surgeon: Wilford Corner, MD;  Location: [facility] ENDOSCOPY;  Service: Endoscopy;  Laterality: N/A;    OB History   No obstetric history on file.      Home Medications     Prior to Admission medications   Medication Sig Start Date End Date Taking? Authorizing Provider  alendronate (FOSAMAX) 70 MG tablet Take 70 mg by mouth every 7 (seven) days. Take with a full glass of water on an empty stomach.   Yes [provider]  amLODipine (NORVASC) 5 MG tablet Take 5 mg by mouth at bedtime.   Yes [provider]  aspirin EC 81 MG EC tablet Take 1 tablet (81 mg total) by mouth daily. 02/07/15  Yes Carlena Bjornstad, MD  atorvastatin (LIPITOR) 40 MG tablet TAKE 1 TABLET BY MOUTH AT 6 PM 04/30/17  Yes Turner, Lorah Kalina R, MD  carvedilol (COREG) 25 MG tablet TAKE 1 TABLET (25 MG TOTAL) BY MOUTH 2 (TWO) TIMES DAILY WITH A MEAL. 10/25/18  Yes Turner, Eber Hong, MD  fluticasone (FLONASE) 50 MCG/ACT nasal spray Place 2 sprays into both nostrils as needed for allergies or rhinitis. 11/26/16  Yes Barnet Glasgow, NP  hydrochlorothiazide (HYDRODIURIL) 12.5 MG tablet TAKE 1 TABLET (12.5 MG TOTAL) BY MOUTH DAILY AS NEEDED (FOR SWELLING). 11/18/18  Yes Turner, Eber Hong, MD  losartan (COZAAR) 100 MG tablet Take 1 tablet (100 mg total) by mouth daily. 03/01/19  Yes Turner, Cera Rorke R, MD  montelukast (SINGULAIR) 10 MG tablet Take 10 mg by mouth at bedtime.   Yes [provider]  potassium chloride SA (K-DUR,KLOR-CON) 20 MEQ tablet Take 1 tablet (20 mEq total) by mouth daily. 10/05/18  Yes Turner, Eber Hong, MD  predniSONE (DELTASONE) 5 MG tablet Take 2.5 mg by mouth daily.   Yes [provider]  SYMBICORT 160-4.5 MCG/ACT inhaler INHALE 2 PUFFS TWICE DAILY TO PREVENT COUGH OR WHEEZE. RINSE, GARGLE, AND SPIT AFTER USE. 07/31/15  Yes Bardelas, Jens Som, MD  albuterol (PROVENTIL HFA;VENTOLIN HFA) 108 (90 Base) MCG/ACT inhaler Inhale 2 puffs into the lungs every 6 (six) hours as needed for wheezing or shortness of breath.    [provider]  loratadine (CLARITIN) 10 MG tablet Take 10 mg by mouth daily.      [provider]    Family History Family History   Problem Relation Age of Onset  . Heart attack Mother   . Hypertension Mother   . Dementia Father   . Healthy Sister   . Healthy Brother   . Healthy Sister   . Healthy Sister   . Healthy Brother   . Healthy Brother     Social History Social History   Tobacco Use  . Smoking status: Never Smoker  . Smokeless tobacco: Never Used  Substance Use Topics  . Alcohol use: No  . Drug use: No     Allergies   Guaifenesin & derivatives, Cetirizine hcl, Plavix [clopidogrel bisulfate], and Xyzal [cetirizine hcl]   Review of Systems Review of Systems   Physical Exam Triage Vital Signs ED Triage Vitals  Enc Vitals Group     BP 08/31/19 1040 108/70     Pulse Rate 08/31/19 1040 72     Resp 08/31/19 1040 16     Temp 08/31/19 1040 98 F (36.7 C)     Temp Source 08/31/19 1040 Oral  SpO2 08/31/19 1040 100 %     Weight --      Height --      Head Circumference --      Peak Flow --      Pain Score 08/31/19 1042 3     Pain Loc --      Pain Edu? --      Excl. in [place]? --    No data found.  Updated Vital Signs BP 108/70 (BP Location: Left Arm)   Pulse 72   Temp 98 F (36.7 C) (Oral)   Resp 16   SpO2 100%   Visual Acuity Right Eye Distance:   Left Eye Distance:   Bilateral Distance:    Right Eye Near:   Left Eye Near:    Bilateral Near:     Physical Exam Vitals and nursing note reviewed.  Constitutional:      General: She is not in acute distress.    Appearance: Normal appearance. She is not ill-appearing, toxic-appearing or diaphoretic.  HENT:     Head: Normocephalic.     Nose: Nose normal.     Mouth/Throat:     Pharynx: Oropharynx is clear.  Eyes:     Conjunctiva/sclera: Conjunctivae normal.  Neck:     Thyroid: No thyroid mass, thyromegaly or thyroid tenderness.     Trachea: Trachea normal.      Comments: 1 occipital and 1 cervical enlarged lymph node.  Non tender to lymph nodes Tender to trapezius muscles with tightness in the muscles.   Cardiovascular:     Rate and Rhythm: Normal rate and regular rhythm.     Pulses: Normal pulses.     Heart sounds: Normal heart sounds.  Pulmonary:     Effort: Pulmonary effort is normal.     Breath sounds: Normal breath sounds.  Abdominal:     Palpations: Abdomen is soft.     Tenderness: There is no abdominal tenderness.  Musculoskeletal:        General: Normal range of motion.     Cervical back: Full passive range of motion without pain and normal range of motion. No signs of trauma. Muscular tenderness present. No spinous process tenderness.  Lymphadenopathy:     Cervical: Cervical adenopathy present.  Skin:    General: Skin is warm and dry.     Findings: No rash.  Neurological:     Mental Status: She is alert.  Psychiatric:        Mood and Affect: Mood normal.      UC Treatments / Results  Labs (all labs ordered are listed, but only abnormal results are displayed) Labs Reviewed  CBC WITH DIFFERENTIAL/PLATELET - Abnormal; Notable for the following components:      Result Value   Hemoglobin 11.7 (*)    HCT 35.4 (*)    All other components within normal limits  COMPREHENSIVE METABOLIC PANEL - Abnormal; Notable for the following components:   Potassium 3.3 (*)    Glucose, Bld 109 (*)    Creatinine, Ser 1.38 (*)    AST 13 (*)    GFR calc non Af Amer 38 (*)    GFR calc Af Amer 44 (*)    All other components within normal limits  NOVEL CORONAVIRUS, NAA (HOSP ORDER, SEND-OUT TO REF LAB; TAT 18-24 HRS)    EKG   Radiology DG Cervical Spine Complete  Result Date: 08/31/2019 CLINICAL DATA:  Neck pain with lymphadenopathy. EXAM: CERVICAL SPINE - COMPLETE 4+ VIEW COMPARISON:  None. FINDINGS:  No evidence of fracture or erosion. Diffuse facet degeneration with anterolisthesis from C3-4 to C6-7. C5-6 and C6-7 predominant degenerative disc narrowing. No prevertebral thickening. Clear apical lungs. IMPRESSION: 1. Diffuse facet osteoarthritis with multilevel listhesis. 2. No  acute finding. Electronically Signed   By: Monte Fantasia M.D.   On: 08/31/2019 11:23    Procedures Procedures (including critical care time)  Medications Ordered in UC Medications - No data to display  Initial Impression / Assessment and Plan / UC Course  I have reviewed the triage vital signs and the nursing notes.  Pertinent labs & imaging results that were available during my care of the patient were reviewed by me and considered in my medical decision making (see chart for details).  Clinical Course as of Aug 31 808  Thu Sep 01, 2019  0808 Creatinine(!): 1.38 [TB]  0808 GFR, Est African American(!): 44 [TB]  0808 Potassium(!): 3.3 [TB]    Clinical Course User Index [TB] Loura Halt A, NP   Neck pain-no acute findings on x-ray.  All degenerative changes. Most pain and swelling is in the trapezius muscles. No erythema Patient also has occipital and cervical lymphadenopathy. Nontender to palpation of lymph nodes. Blood work pending Most likely this is some sort of viral illness.  Could be Covid with body aches and lymphadenopathy. Creatinine slightly worse since previous.  Potassium 3.3 Will give her a call and relay these results Will have her follow up with her PCP.    Final Clinical Impressions(s) / UC Diagnoses   Final diagnoses:  Neck pain  Lymphadenopathy     Discharge Instructions     Your neck x-ray did not show anything concerning. I will call you if any of your blood work comes back abnormal This could be some sort of viral illness causing the body aches and lymph node swelling. If this problem continues or worsens you will need to follow-up with your primary care.    ED Prescriptions    None     PDMP not reviewed this encounter.   Loura Halt A, NP 09/01/19 [phone]

## 2019-09-02 LAB — NOVEL CORONAVIRUS, NAA (HOSP ORDER, SEND-OUT TO REF LAB; TAT 18-24 HRS): SARS-CoV-2, NAA: NOT DETECTED

## 2019-09-20 DIAGNOSIS — H5201 Hypermetropia, right eye: Secondary | ICD-10-CM | POA: Diagnosis not present

## 2019-09-20 DIAGNOSIS — H2513 Age-related nuclear cataract, bilateral: Secondary | ICD-10-CM | POA: Diagnosis not present

## 2019-09-20 DIAGNOSIS — H401132 Primary open-angle glaucoma, bilateral, moderate stage: Secondary | ICD-10-CM | POA: Diagnosis not present

## 2019-09-20 DIAGNOSIS — H349 Unspecified retinal vascular occlusion: Secondary | ICD-10-CM | POA: Diagnosis not present

## 2019-09-21 DIAGNOSIS — H348311 Tributary (branch) retinal vein occlusion, right eye, with retinal neovascularization: Secondary | ICD-10-CM | POA: Diagnosis not present

## 2019-09-21 DIAGNOSIS — H4312 Vitreous hemorrhage, left eye: Secondary | ICD-10-CM | POA: Diagnosis not present

## 2019-09-21 DIAGNOSIS — H35373 Puckering of macula, bilateral: Secondary | ICD-10-CM | POA: Diagnosis not present

## 2019-09-21 DIAGNOSIS — H348121 Central retinal vein occlusion, left eye, with retinal neovascularization: Secondary | ICD-10-CM | POA: Diagnosis not present

## 2019-09-27 DIAGNOSIS — H348311 Tributary (branch) retinal vein occlusion, right eye, with retinal neovascularization: Secondary | ICD-10-CM | POA: Diagnosis not present

## 2019-09-27 DIAGNOSIS — H348121 Central retinal vein occlusion, left eye, with retinal neovascularization: Secondary | ICD-10-CM | POA: Diagnosis not present

## 2019-10-04 NOTE — Progress Notes (Signed)
Cardiology Office Note:    Date:  10/05/2019   ID:  Deanna Brown, DOB [birth year], MRN [id number]  PCP:  Lucianne Lei, MD  Cardiologist:  No primary care provider on file.    Referring MD: Lucianne Lei, MD   Chief Complaint  Patient presents with  . Cardiomyopathy  . Hypertension    History of Present Illness:    Deanna Brown is a 74 y.o. female with a hx of asthma, GERD, PVCs, HTN, moderate pulmonary HTN, PVCs, Takotsubo CM with NSTEMI 2014 with initial EF 25-30% but repeat EF 6 months later by echo was 60% and pulmonary HTN had resolved.  She is here today for followup and is doing well.  She denies any chest pain or pressure, SOB, DOE, PND, orthopnea, LE edema, dizziness, palpitations or syncope. She is compliant with her meds and is tolerating meds with no SE.    Past Medical History:  Diagnosis Date  . Asthma   . Ejection fraction   . Environmental allergies   . GERD (gastroesophageal reflux disease)   . High cholesterol   . Hypertension   . Myocardial infarction [facility])    01-10-13- [facility],cardiology -yearly visits next 5'2016, denies any further problems  . Pulmonary HTN ([place])    a. Mod pulm HTN by cath 01/2013 (PASP 29mmHg).10-30-14 tolerates walking 30 minutes contiuously x 3-4 times weekly, denies SOB or issues.  Marland Kitchen PVC's (premature ventricular contractions)    a. Noted during 01/2013 admission.  . Seasonal allergies   . Stroke [facility])    "Slurred speech, unsteady gait "01-15-2013- no residual effects.  . Takotsubo cardiomyopathy    a. NSTEMI 2/2 takotsubo 01/2013, no significant CAD by cath, EF 25%;  b. 01/2013 Echo: EF 25-30%, mod LVH, Sev HK in all segments except base. Mild/mod RV dysfxn, PASP 26mmHg.;  c.  Echo 07/2013: normal LVF with EF 60% and normal PAP  . Uterine cancer [facility])     Past Surgical History:  Procedure Laterality Date  . ABDOMINAL HYSTERECTOMY  1999  . BALLOON DILATION N/A 11/30/2014   Procedure: BALLOON DILATION;  Surgeon: Wilford Corner, MD;   Location: [facility] ENDOSCOPY;  Service: Endoscopy;  Laterality: N/A;  . ESOPHAGOGASTRODUODENOSCOPY (EGD) WITH PROPOFOL N/A 11/01/2014   Procedure: ESOPHAGOGASTRODUODENOSCOPY (EGD) WITH PROPOFOL;  Surgeon: Laurence Spates, MD;  Location: [facility] ENDOSCOPY;  Service: Endoscopy;  Laterality: N/A;  . ESOPHAGOGASTRODUODENOSCOPY (EGD) WITH PROPOFOL N/A 11/30/2014   Procedure: ESOPHAGOGASTRODUODENOSCOPY (EGD) WITH PROPOFOL;  Surgeon: Wilford Corner, MD;  Location[facility] ENDOSCOPY;  Service: Endoscopy;  Laterality: N/A;  . LEFT AND RIGHT HEART CATHETERIZATION WITH CORONARY ANGIOGRAM N/A 01/11/2013   Procedure: LEFT AND RIGHT HEART CATHETERIZATION WITH CORONARY ANGIOGRAM;  Surgeon: Minus Breeding, MD;  Location: [facility] CATH LAB;  Service: Cardiovascular;  Laterality: N/A;  . SAVORY DILATION N/A 11/01/2014   Procedure: SAVORY DILATION;  Surgeon: Laurence Spates, MD;  Location: [facility] ENDOSCOPY;  Service: Endoscopy;  Laterality: N/A;  . SAVORY DILATION N/A 11/30/2014   Procedure: SAVORY DILATION;  Surgeon: Wilford Corner, MD;  Location: [facility] ENDOSCOPY;  Service: Endoscopy;  Laterality: N/A;    Current Medications: Current Meds  Medication Sig  . albuterol (PROVENTIL HFA;VENTOLIN HFA) 108 (90 Base) MCG/ACT inhaler Inhale 2 puffs into the lungs every 6 (six) hours as needed for wheezing or shortness of breath.  Marland Kitchen alendronate (FOSAMAX) 70 MG tablet Take 70 mg by mouth every 7 (seven) days. Take with a full glass of water on an empty stomach.  Marland Kitchen amLODipine (NORVASC) 5 MG tablet Take  5 mg by mouth at bedtime.  Marland Kitchen aspirin EC 81 MG EC tablet Take 1 tablet (81 mg total) by mouth daily.  Marland Kitchen atorvastatin (LIPITOR) 40 MG tablet TAKE 1 TABLET BY MOUTH AT 6 PM  . carvedilol (COREG) 25 MG tablet TAKE 1 TABLET (25 MG TOTAL) BY MOUTH 2 (TWO) TIMES DAILY WITH A MEAL.  . fluticasone (FLONASE) 50 MCG/ACT nasal spray Place 2 sprays into both nostrils as needed for allergies or rhinitis.  . hydrochlorothiazide (HYDRODIURIL) 12.5 MG tablet TAKE 1 TABLET  (12.5 MG TOTAL) BY MOUTH DAILY AS NEEDED (FOR SWELLING).  Marland Kitchen latanoprost (XALATAN) 0.005 % ophthalmic solution 1 drop at bedtime.  Marland Kitchen loratadine (CLARITIN) 10 MG tablet Take 10 mg by mouth daily.    Marland Kitchen losartan (COZAAR) 100 MG tablet Take 1 tablet (100 mg total) by mouth daily.  . montelukast (SINGULAIR) 10 MG tablet Take 10 mg by mouth at bedtime.  . potassium chloride SA (K-DUR,KLOR-CON) 20 MEQ tablet Take 1 tablet (20 mEq total) by mouth daily.  . predniSONE (DELTASONE) 5 MG tablet Take 2.5 mg by mouth daily.  . SYMBICORT 160-4.5 MCG/ACT inhaler INHALE 2 PUFFS TWICE DAILY TO PREVENT COUGH OR WHEEZE. RINSE, GARGLE, AND SPIT AFTER USE.     Allergies:   Guaifenesin & derivatives, Cetirizine hcl, Plavix [clopidogrel bisulfate], and Xyzal [cetirizine hcl]   Social History   Socioeconomic History  . Marital status: Widowed    Spouse name: Not on file  . Number of children: 3  . Years of education: 12th  . Highest education level: Not on file  Occupational History  . Occupation: clean Librarian, academic: [employer]  Tobacco Use  . Smoking status: Never Smoker  . Smokeless tobacco: Never Used  Substance and Sexual Activity  . Alcohol use: No  . Drug use: No  . Sexual activity: Never  Other Topics Concern  . Not on file  Social History Narrative   Lives in [place] with her son.  Her dtr also lives nearby.   Social Determinants of Health   Financial Resource Strain:   . Difficulty of Paying Living Expenses: Not on file  Food Insecurity:   . Worried About Charity fundraiser in the Last Year: Not on file  . Ran Out of Food in the Last Year: Not on file  Transportation Needs:   . Lack of Transportation (Medical): Not on file  . Lack of Transportation (Non-Medical): Not on file  Physical Activity:   . Days of Exercise per Week: Not on file  . Minutes of Exercise per Session: Not on file  Stress:   . Feeling of Stress : Not on file  Social Connections:   . Frequency of  Communication with Friends and Family: Not on file  . Frequency of Social Gatherings with Friends and Family: Not on file  . Attends Religious Services: Not on file  . Active Member of Clubs or Organizations: Not on file  . Attends Archivist Meetings: Not on file  . Marital Status: Not on file     Family History: The patient's family history includes Dementia in her father; Healthy in her brother, brother, brother, sister, sister, and sister; Heart attack in her mother; Hypertension in her mother.  ROS:   Please see the history of present illness.    ROS  All other systems reviewed and negative.   EKGs/Labs/Other Studies Reviewed:    The following studies were reviewed today: EKG, outside labs from PCP  on KPN  EKG:  EKG is  ordered today.  The ekg ordered today demonstrates NSR with no ST changes  Recent Labs: 08/31/2019: ALT 10; BUN 15; Creatinine, Ser 1.38; Hemoglobin 11.7; Platelets 232; Potassium 3.3; Sodium 137   Recent Lipid Panel    Component Value Date/Time   CHOL 71 03/15/2013 1025   TRIG 64.0 03/15/2013 1025   HDL 17.40 (L) 03/15/2013 1025   CHOLHDL 4 03/15/2013 1025   VLDL 12.8 03/15/2013 1025   LDLCALC 41 03/15/2013 1025    Physical Exam:    VS:  BP 136/80   Pulse 68   Ht 5\' 1"  (1.549 m)   Wt 125 lb 9.6 oz (57 kg)   BMI 23.73 kg/m     Wt Readings from Last 3 Encounters:  10/05/19 125 lb 9.6 oz (57 kg)  10/04/18 126 lb (57.2 kg)  08/20/17 132 lb 9.6 oz (60.1 kg)     GEN:  Well nourished, well developed in no acute distress HEENT: Normal NECK: No JVD; No carotid bruits LYMPHATICS: No lymphadenopathy CARDIAC: RRR, no murmurs, rubs, gallops RESPIRATORY:  Clear to auscultation without rales, wheezing or rhonchi  ABDOMEN: Soft, non-tender, non-distended MUSCULOSKELETAL:  No edema; No deformity  SKIN: Warm and dry NEUROLOGIC:  Alert and oriented x 3 PSYCHIATRIC:  Normal affect   ASSESSMENT:    1. Takotsubo cardiomyopathy (history of  2014)   2. Essential hypertension   3. PVC's (premature ventricular contractions)   4. Edema of extremities    PLAN:    In order of problems listed above:  1. Takotubo DCM -S/P NSTEMI 2014 with EF 25-30% with normal coronary arteries at time of cath -followup echo showed normalization of LVF  2.  HTN -BP controlled -continue Carvedilol 25mg  BID, Cozaar 100mg  daily, amlodipine 5mg  daily -outside labs reviewed from PCP on KPN and showed a creatinine of 1.38 and K+3.3  -repeat BMET   3.  PVCs -palpitations well controlled on BB therapy  4.  Chronic lower extremity edema -remains well controlled on HCTZ -continue diuretics -encouraged to follow < 2gm Na diet -labs reviewed from [facility] and creatinine stable at 1.38   Medication Adjustments/Labs and Tests Ordered: Current medicines are reviewed at length with the patient today.  Concerns regarding medicines are outlined above.  Orders Placed This Encounter  Procedures  . EKG 12-Lead   No orders of the defined types were placed in this encounter.   Signed, Fransico Him, MD  10/05/2019 9:54 AM    [place]

## 2019-10-05 ENCOUNTER — Encounter: Payer: Self-pay | Admitting: Cardiology

## 2019-10-05 ENCOUNTER — Ambulatory Visit (INDEPENDENT_AMBULATORY_CARE_PROVIDER_SITE_OTHER): Payer: Medicare Other | Admitting: Cardiology

## 2019-10-05 ENCOUNTER — Other Ambulatory Visit: Payer: Self-pay

## 2019-10-05 VITALS — BP 136/80 | HR 68 | Ht 61.0 in | Wt 125.6 lb

## 2019-10-05 DIAGNOSIS — I5181 Takotsubo syndrome: Secondary | ICD-10-CM

## 2019-10-05 DIAGNOSIS — I493 Ventricular premature depolarization: Secondary | ICD-10-CM | POA: Diagnosis not present

## 2019-10-05 DIAGNOSIS — I10 Essential (primary) hypertension: Secondary | ICD-10-CM

## 2019-10-05 DIAGNOSIS — R600 Localized edema: Secondary | ICD-10-CM | POA: Diagnosis not present

## 2019-10-05 LAB — BASIC METABOLIC PANEL
BUN/Creatinine Ratio: 11 — ABNORMAL LOW (ref 12–28)
BUN: 13 mg/dL (ref 8–27)
CO2: 25 mmol/L (ref 20–29)
Calcium: 9.4 mg/dL (ref 8.7–10.3)
Chloride: 106 mmol/L (ref 96–106)
Creatinine, Ser: 1.19 mg/dL — ABNORMAL HIGH (ref 0.57–1.00)
GFR calc Af Amer: 52 mL/min/{1.73_m2} — ABNORMAL LOW (ref >59)
GFR calc non Af Amer: 45 mL/min/{1.73_m2} — ABNORMAL LOW (ref >59)
Glucose: 74 mg/dL (ref 65–99)
Potassium: 3.5 mmol/L (ref 3.5–5.2)
Sodium: 144 mmol/L (ref 134–144)

## 2019-10-05 MED ORDER — CARVEDILOL 25 MG PO TABS: 25.0000 mg | ORAL_TABLET | Freq: Two times a day (BID) | ORAL | 11 refills | Status: DC

## 2019-10-05 MED ORDER — HYDROCHLOROTHIAZIDE 12.5 MG PO TABS: 12.5000 mg | ORAL_TABLET | Freq: Every day | ORAL | 3 refills | Status: DC | PRN

## 2019-10-05 MED ORDER — LOSARTAN POTASSIUM 100 MG PO TABS: 100.0000 mg | ORAL_TABLET | Freq: Every day | ORAL | 3 refills | Status: DC

## 2019-10-05 MED ORDER — ATORVASTATIN CALCIUM 40 MG PO TABS: ORAL_TABLET | ORAL | 3 refills | Status: DC

## 2019-10-05 MED ORDER — AMLODIPINE BESYLATE 5 MG PO TABS: 5.0000 mg | ORAL_TABLET | Freq: Every day | ORAL | 3 refills | Status: DC

## 2019-10-05 MED ORDER — POTASSIUM CHLORIDE 20 MEQ PO PACK: 20.0000 meq | PACK | Freq: Every day | ORAL | 3 refills | Status: DC

## 2019-10-05 NOTE — Patient Instructions (Signed)
Medication Instructions:  Your physician recommends that you continue on your current medications as directed. Please refer to the Current Medication list given to you today.  *If you need a refill on your cardiac medications before your next appointment, please call your pharmacy*  Lab Work: TODAY: BMET  If you have labs (blood work) drawn today and your tests are completely normal, you will receive your results only by: Marland Kitchen MyChart Message (if you have MyChart) OR . A paper copy in the mail If you have any lab test that is abnormal or we need to change your treatment, we will call you to review the results.   Follow-Up: At [facility], you and your health needs are our priority.  As part of our continuing mission to provide you with exceptional heart care, we have created designated Provider Care Teams.  These Care Teams include your primary Cardiologist (physician) and Advanced Practice Providers (APPs -  Physician Assistants and Nurse Practitioners) who all work together to provide you with the care you need, when you need it.  We recommend signing up for the patient portal called "MyChart".  Sign up information is provided on this After Visit Summary.  MyChart is used to connect with patients for Virtual Visits (Telemedicine).  Patients are able to view lab/test results, encounter notes, upcoming appointments, etc.  Non-urgent messages can be sent to your provider as well.   To learn more about what you can do with MyChart, go to [email or web address].    Your next appointment:   1 year(s)  The format for your next appointment:   Either In Person or Virtual  Provider:   Fransico Him, MD

## 2019-10-25 DIAGNOSIS — H401131 Primary open-angle glaucoma, bilateral, mild stage: Secondary | ICD-10-CM | POA: Diagnosis not present

## 2019-10-25 DIAGNOSIS — H349 Unspecified retinal vascular occlusion: Secondary | ICD-10-CM | POA: Diagnosis not present

## 2019-11-14 DIAGNOSIS — Z1231 Encounter for screening mammogram for malignant neoplasm of breast: Secondary | ICD-10-CM | POA: Diagnosis not present

## 2019-11-15 DIAGNOSIS — I5181 Takotsubo syndrome: Secondary | ICD-10-CM | POA: Diagnosis not present

## 2019-11-15 DIAGNOSIS — E119 Type 2 diabetes mellitus without complications: Secondary | ICD-10-CM | POA: Diagnosis not present

## 2019-11-15 DIAGNOSIS — I6789 Other cerebrovascular disease: Secondary | ICD-10-CM | POA: Diagnosis not present

## 2019-12-02 DIAGNOSIS — I10 Essential (primary) hypertension: Secondary | ICD-10-CM | POA: Diagnosis not present

## 2019-12-02 DIAGNOSIS — E119 Type 2 diabetes mellitus without complications: Secondary | ICD-10-CM | POA: Diagnosis not present

## 2019-12-02 DIAGNOSIS — M818 Other osteoporosis without current pathological fracture: Secondary | ICD-10-CM | POA: Diagnosis not present

## 2019-12-02 DIAGNOSIS — J45998 Other asthma: Secondary | ICD-10-CM | POA: Diagnosis not present

## 2019-12-27 DIAGNOSIS — H4312 Vitreous hemorrhage, left eye: Secondary | ICD-10-CM | POA: Diagnosis not present

## 2019-12-27 DIAGNOSIS — H43811 Vitreous degeneration, right eye: Secondary | ICD-10-CM | POA: Diagnosis not present

## 2019-12-27 DIAGNOSIS — H348121 Central retinal vein occlusion, left eye, with retinal neovascularization: Secondary | ICD-10-CM | POA: Diagnosis not present

## 2019-12-27 DIAGNOSIS — H348311 Tributary (branch) retinal vein occlusion, right eye, with retinal neovascularization: Secondary | ICD-10-CM | POA: Diagnosis not present

## 2020-01-10 DIAGNOSIS — H348311 Tributary (branch) retinal vein occlusion, right eye, with retinal neovascularization: Secondary | ICD-10-CM | POA: Diagnosis not present

## 2020-01-31 DIAGNOSIS — H401131 Primary open-angle glaucoma, bilateral, mild stage: Secondary | ICD-10-CM | POA: Diagnosis not present

## 2020-05-09 ENCOUNTER — Other Ambulatory Visit: Payer: Self-pay | Admitting: Allergy

## 2020-05-09 ENCOUNTER — Ambulatory Visit
Admission: RE | Admit: 2020-05-09 | Discharge: 2020-05-09 | Disposition: A | Discharge status: Home / Self Care | Payer: Medicare Other | Source: Ambulatory Visit | Attending: Allergy | Admitting: Allergy

## 2020-05-09 DIAGNOSIS — J4530 Mild persistent asthma, uncomplicated: Secondary | ICD-10-CM

## 2020-07-11 ENCOUNTER — Other Ambulatory Visit: Payer: Self-pay | Admitting: Otolaryngology

## 2020-07-11 DIAGNOSIS — J329 Chronic sinusitis, unspecified: Secondary | ICD-10-CM

## 2020-07-31 ENCOUNTER — Ambulatory Visit
Admission: RE | Admit: 2020-07-31 | Discharge: 2020-07-31 | Disposition: A | Discharge status: Home / Self Care | Payer: Medicare Other | Source: Ambulatory Visit | Attending: Otolaryngology | Admitting: Otolaryngology

## 2020-07-31 DIAGNOSIS — J329 Chronic sinusitis, unspecified: Secondary | ICD-10-CM

## 2020-08-20 ENCOUNTER — Other Ambulatory Visit: Payer: Self-pay | Admitting: Otolaryngology

## 2020-08-27 ENCOUNTER — Other Ambulatory Visit: Payer: Self-pay

## 2020-08-27 ENCOUNTER — Encounter (HOSPITAL_BASED_OUTPATIENT_CLINIC_OR_DEPARTMENT_OTHER): Payer: Self-pay | Admitting: Otolaryngology

## 2020-08-27 NOTE — Progress Notes (Signed)
Chart and most recent cardiology note reviewed with Dr. Elgie Congo. Ok to proceed with surgery without cardiac clearance or any further cardiac testing. Patient denies recent chest pain or shortness of breath at time of phone call.

## 2020-08-30 ENCOUNTER — Other Ambulatory Visit (HOSPITAL_COMMUNITY)
Admission: RE | Admit: 2020-08-30 | Discharge: 2020-08-30 | Disposition: A | Discharge status: Home / Self Care | Payer: Medicare Other | Source: Ambulatory Visit | Attending: Otolaryngology | Admitting: Otolaryngology

## 2020-08-30 ENCOUNTER — Encounter (HOSPITAL_BASED_OUTPATIENT_CLINIC_OR_DEPARTMENT_OTHER)
Admission: RE | Admit: 2020-08-30 | Discharge: 2020-08-30 | Disposition: A | Discharge status: Home / Self Care | Payer: Medicare Other | Source: Ambulatory Visit | Attending: Otolaryngology | Admitting: Otolaryngology

## 2020-08-30 DIAGNOSIS — Z01812 Encounter for preprocedural laboratory examination: Secondary | ICD-10-CM | POA: Insufficient documentation

## 2020-08-30 DIAGNOSIS — Z20822 Contact with and (suspected) exposure to covid-19: Secondary | ICD-10-CM | POA: Diagnosis not present

## 2020-08-30 LAB — BASIC METABOLIC PANEL
Anion gap: 7 (ref 5–15)
BUN: 7 mg/dL — ABNORMAL LOW (ref 8–23)
CO2: 25 mmol/L (ref 22–32)
Calcium: 9.6 mg/dL (ref 8.9–10.3)
Chloride: 108 mmol/L (ref 98–111)
Creatinine, Ser: 1.09 mg/dL — ABNORMAL HIGH (ref 0.44–1.00)
GFR, Estimated: 53 mL/min — ABNORMAL LOW (ref >60)
Glucose, Bld: 104 mg/dL — ABNORMAL HIGH (ref 70–99)
Potassium: 3.8 mmol/L (ref 3.5–5.1)
Sodium: 140 mmol/L (ref 135–145)

## 2020-08-31 LAB — SARS CORONAVIRUS 2 (TAT 6-24 HRS): SARS Coronavirus 2: NEGATIVE

## 2020-09-03 ENCOUNTER — Encounter (HOSPITAL_BASED_OUTPATIENT_CLINIC_OR_DEPARTMENT_OTHER): Payer: Self-pay | Admitting: Otolaryngology

## 2020-09-03 ENCOUNTER — Ambulatory Visit (HOSPITAL_BASED_OUTPATIENT_CLINIC_OR_DEPARTMENT_OTHER): Payer: Medicare Other | Admitting: Anesthesiology

## 2020-09-03 ENCOUNTER — Encounter (HOSPITAL_BASED_OUTPATIENT_CLINIC_OR_DEPARTMENT_OTHER)
Admission: RE | Disposition: A | Discharge status: Home / Self Care | Payer: Self-pay | Source: Home / Self Care | Attending: Otolaryngology

## 2020-09-03 ENCOUNTER — Ambulatory Visit (HOSPITAL_BASED_OUTPATIENT_CLINIC_OR_DEPARTMENT_OTHER)
Admission: RE | Admit: 2020-09-03 | Discharge: 2020-09-03 | Disposition: A | Discharge status: Home / Self Care | Payer: Medicare Other | Attending: Otolaryngology | Admitting: Otolaryngology

## 2020-09-03 ENCOUNTER — Other Ambulatory Visit: Payer: Self-pay

## 2020-09-03 DIAGNOSIS — J343 Hypertrophy of nasal turbinates: Secondary | ICD-10-CM | POA: Insufficient documentation

## 2020-09-03 DIAGNOSIS — J3489 Other specified disorders of nose and nasal sinuses: Secondary | ICD-10-CM | POA: Diagnosis not present

## 2020-09-03 DIAGNOSIS — Z8542 Personal history of malignant neoplasm of other parts of uterus: Secondary | ICD-10-CM | POA: Insufficient documentation

## 2020-09-03 DIAGNOSIS — J338 Other polyp of sinus: Secondary | ICD-10-CM | POA: Diagnosis not present

## 2020-09-03 DIAGNOSIS — J324 Chronic pansinusitis: Secondary | ICD-10-CM | POA: Diagnosis present

## 2020-09-03 DIAGNOSIS — Z8673 Personal history of transient ischemic attack (TIA), and cerebral infarction without residual deficits: Secondary | ICD-10-CM | POA: Diagnosis not present

## 2020-09-03 HISTORY — PX: SPHENOIDECTOMY: SHX2421

## 2020-09-03 HISTORY — PX: FRONTAL SINUS EXPLORATION: SHX6591

## 2020-09-03 HISTORY — PX: SINUS ENDO WITH FUSION: SHX5329

## 2020-09-03 HISTORY — PX: ETHMOIDECTOMY: SHX5197

## 2020-09-03 HISTORY — PX: MAXILLARY ANTROSTOMY: SHX2003

## 2020-09-03 HISTORY — PX: TURBINATE REDUCTION: SHX6157

## 2020-09-03 SURGERY — REDUCTION, NASAL TURBINATE
Anesthesia: General | Site: Nose | Laterality: Bilateral

## 2020-09-03 MED ORDER — MUPIROCIN 2 % EX OINT
TOPICAL_OINTMENT | CUTANEOUS | Status: DC | PRN
  Administered 2020-09-03: 1 via TOPICAL

## 2020-09-03 MED ORDER — ROCURONIUM BROMIDE 10 MG/ML (PF) SYRINGE
PREFILLED_SYRINGE | INTRAVENOUS | Status: DC | PRN
  Administered 2020-09-03: 40 mg via INTRAVENOUS

## 2020-09-03 MED ORDER — OXYCODONE HCL 5 MG/5ML PO SOLN: 5.0000 mg | Freq: Once | ORAL | Status: DC | PRN

## 2020-09-03 MED ORDER — MUPIROCIN 2 % EX OINT
TOPICAL_OINTMENT | CUTANEOUS | Status: AC
  Filled 2020-09-03: qty 22

## 2020-09-03 MED ORDER — FENTANYL CITRATE (PF) 100 MCG/2ML IJ SOLN: 25.0000 ug | INTRAMUSCULAR | Status: DC | PRN

## 2020-09-03 MED ORDER — PROPOFOL 500 MG/50ML IV EMUL
INTRAVENOUS | Status: AC
  Filled 2020-09-03: qty 50

## 2020-09-03 MED ORDER — DEXAMETHASONE SODIUM PHOSPHATE 10 MG/ML IJ SOLN
INTRAMUSCULAR | Status: DC | PRN
  Administered 2020-09-03: 5 mg via INTRAVENOUS

## 2020-09-03 MED ORDER — FENTANYL CITRATE (PF) 100 MCG/2ML IJ SOLN
INTRAMUSCULAR | Status: DC | PRN
  Administered 2020-09-03: 25 ug via INTRAVENOUS
  Administered 2020-09-03: 50 ug via INTRAVENOUS
  Administered 2020-09-03: 25 ug via INTRAVENOUS

## 2020-09-03 MED ORDER — AMOXICILLIN 875 MG PO TABS: 875.0000 mg | ORAL_TABLET | Freq: Two times a day (BID) | ORAL | 0 refills | Status: AC

## 2020-09-03 MED ORDER — SUGAMMADEX SODIUM 200 MG/2ML IV SOLN
INTRAVENOUS | Status: DC | PRN
  Administered 2020-09-03: 200 mg via INTRAVENOUS

## 2020-09-03 MED ORDER — LIDOCAINE 2% (20 MG/ML) 5 ML SYRINGE
INTRAMUSCULAR | Status: DC | PRN
  Administered 2020-09-03: 50 mg via INTRAVENOUS

## 2020-09-03 MED ORDER — DEXAMETHASONE SODIUM PHOSPHATE 10 MG/ML IJ SOLN
INTRAMUSCULAR | Status: AC
  Filled 2020-09-03: qty 1

## 2020-09-03 MED ORDER — OXYCODONE HCL 5 MG PO TABS: 5.0000 mg | ORAL_TABLET | Freq: Once | ORAL | Status: DC | PRN

## 2020-09-03 MED ORDER — CEFAZOLIN SODIUM-DEXTROSE 2-3 GM-%(50ML) IV SOLR
INTRAVENOUS | Status: DC | PRN
  Administered 2020-09-03: 2 g via INTRAVENOUS

## 2020-09-03 MED ORDER — LIDOCAINE-EPINEPHRINE 1 %-1:100000 IJ SOLN
INTRAMUSCULAR | Status: AC
  Filled 2020-09-03: qty 2

## 2020-09-03 MED ORDER — LACTATED RINGERS IV SOLN: INTRAVENOUS | Status: DC

## 2020-09-03 MED ORDER — ONDANSETRON HCL 4 MG/2ML IJ SOLN: 4.0000 mg | Freq: Once | INTRAMUSCULAR | Status: DC | PRN

## 2020-09-03 MED ORDER — 0.9 % SODIUM CHLORIDE (POUR BTL) OPTIME
TOPICAL | Status: DC | PRN
  Administered 2020-09-03: 100 mL

## 2020-09-03 MED ORDER — ONDANSETRON HCL 4 MG/2ML IJ SOLN
INTRAMUSCULAR | Status: AC
  Filled 2020-09-03: qty 2

## 2020-09-03 MED ORDER — ONDANSETRON HCL 4 MG/2ML IJ SOLN
INTRAMUSCULAR | Status: DC | PRN
  Administered 2020-09-03: 4 mg via INTRAVENOUS

## 2020-09-03 MED ORDER — LIDOCAINE 2% (20 MG/ML) 5 ML SYRINGE
INTRAMUSCULAR | Status: AC
  Filled 2020-09-03: qty 5

## 2020-09-03 MED ORDER — AMISULPRIDE (ANTIEMETIC) 5 MG/2ML IV SOLN: 10.0000 mg | Freq: Once | INTRAVENOUS | Status: DC | PRN

## 2020-09-03 MED ORDER — PROPOFOL 10 MG/ML IV BOLUS
INTRAVENOUS | Status: DC | PRN
  Administered 2020-09-03: 100 mg via INTRAVENOUS

## 2020-09-03 MED ORDER — PROPOFOL 10 MG/ML IV BOLUS
INTRAVENOUS | Status: AC
  Filled 2020-09-03: qty 20

## 2020-09-03 MED ORDER — OXYCODONE-ACETAMINOPHEN 5-325 MG PO TABS: 1.0000 | ORAL_TABLET | ORAL | 0 refills | Status: AC | PRN

## 2020-09-03 MED ORDER — ROCURONIUM BROMIDE 10 MG/ML (PF) SYRINGE
PREFILLED_SYRINGE | INTRAVENOUS | Status: AC
  Filled 2020-09-03: qty 10

## 2020-09-03 MED ORDER — OXYMETAZOLINE HCL 0.05 % NA SOLN
NASAL | Status: AC
  Filled 2020-09-03: qty 30

## 2020-09-03 MED ORDER — OXYMETAZOLINE HCL 0.05 % NA SOLN
NASAL | Status: DC | PRN
  Administered 2020-09-03: 1 via TOPICAL

## 2020-09-03 MED ORDER — FENTANYL CITRATE (PF) 100 MCG/2ML IJ SOLN
INTRAMUSCULAR | Status: AC
  Filled 2020-09-03: qty 2

## 2020-09-03 SURGICAL SUPPLY — 50 items
ATTRACTOMAT 16X20 MAGNETIC DRP (DRAPES) IMPLANT
BLADE RAD40 ROTATE 4M 4 5PK (BLADE) IMPLANT
BLADE RAD60 ROTATE M4 4 5PK (BLADE) IMPLANT
BLADE ROTATE RAD 12 4 M4 (BLADE) IMPLANT
BLADE ROTATE RAD 40 4 M4 (BLADE) IMPLANT
BLADE ROTATE TRICUT 4X13 M4 (BLADE) ×2 IMPLANT
BLADE TRICUT ROTATE M4 4 5PK (BLADE) IMPLANT
BUR HS RAD FRONTAL 3 (BURR) IMPLANT
CANISTER SUC SOCK COL 7IN (MISCELLANEOUS) ×2 IMPLANT
CANISTER SUCT 1200ML W/VALVE (MISCELLANEOUS) ×4 IMPLANT
COAGULATOR SUCT 8FR VV (MISCELLANEOUS) ×2 IMPLANT
COVER WAND RF STERILE (DRAPES) IMPLANT
DECANTER SPIKE VIAL GLASS SM (MISCELLANEOUS) IMPLANT
DRSG NASAL KENNEDY LMNT 8CM (GAUZE/BANDAGES/DRESSINGS) IMPLANT
DRSG NASOPORE 8CM (GAUZE/BANDAGES/DRESSINGS) ×2 IMPLANT
DRSG TELFA 3X8 NADH (GAUZE/BANDAGES/DRESSINGS) IMPLANT
ELECT REM PT RETURN 9FT ADLT (ELECTROSURGICAL) ×2
ELECTRODE REM PT RTRN 9FT ADLT (ELECTROSURGICAL) ×1 IMPLANT
GLOVE BIO SURGEON STRL SZ7.5 (GLOVE) ×2 IMPLANT
GLOVE ECLIPSE 6.5 STRL STRAW (GLOVE) ×2 IMPLANT
GLOVE SURG UNDER POLY LF SZ6.5 (GLOVE) ×2 IMPLANT
GOWN STRL REUS W/ TWL LRG LVL3 (GOWN DISPOSABLE) ×2 IMPLANT
GOWN STRL REUS W/TWL LRG LVL3 (GOWN DISPOSABLE) ×4
HEMOSTAT SURGICEL 2X14 (HEMOSTASIS) IMPLANT
IV NS 500ML (IV SOLUTION) ×2
IV NS 500ML BAXH (IV SOLUTION) ×1 IMPLANT
NEEDLE HYPO 25X1 1.5 SAFETY (NEEDLE) IMPLANT
NEEDLE SPNL 25GX3.5 QUINCKE BL (NEEDLE) IMPLANT
NS IRRIG 1000ML POUR BTL (IV SOLUTION) ×2 IMPLANT
PACK BASIN DAY SURGERY FS (CUSTOM PROCEDURE TRAY) ×2 IMPLANT
PACK ENT DAY SURGERY (CUSTOM PROCEDURE TRAY) ×2 IMPLANT
PATTIES SURGICAL .5 X3 (DISPOSABLE) IMPLANT
SLEEVE SCD COMPRESS KNEE MED (MISCELLANEOUS) ×2 IMPLANT
SOLUTION BUTLER CLEAR DIP (MISCELLANEOUS) ×2 IMPLANT
SPLINT NASAL AIRWAY SILICONE (MISCELLANEOUS) IMPLANT
SPONGE GAUZE 2X2 8PLY STRL LF (GAUZE/BANDAGES/DRESSINGS) ×2 IMPLANT
SPONGE NEURO XRAY DETECT 1X3 (DISPOSABLE) ×2 IMPLANT
SUCTION FRAZIER HANDLE 10FR (MISCELLANEOUS)
SUCTION TUBE FRAZIER 10FR DISP (MISCELLANEOUS) IMPLANT
SUT CHROMIC 4 0 P 3 18 (SUTURE) IMPLANT
SUT PLAIN 4 0 ~~LOC~~ 1 (SUTURE) IMPLANT
SUT PROLENE 3 0 PS 2 (SUTURE) IMPLANT
SYR 50ML LL SCALE MARK (SYRINGE) ×2 IMPLANT
TOWEL GREEN STERILE FF (TOWEL DISPOSABLE) ×2 IMPLANT
TRACKER ENT INSTRUMENT (MISCELLANEOUS) ×2 IMPLANT
TRACKER ENT PATIENT (MISCELLANEOUS) ×2 IMPLANT
TUBE CONNECTING 20X1/4 (TUBING) ×2 IMPLANT
TUBE SALEM SUMP 16 FR W/ARV (TUBING) ×2 IMPLANT
TUBING STRAIGHTSHOT EPS 5PK (TUBING) ×2 IMPLANT
YANKAUER SUCT BULB TIP NO VENT (SUCTIONS) ×2 IMPLANT

## 2020-09-03 NOTE — Transfer of Care (Signed)
Immediate Anesthesia Transfer of Care Note  Patient: GARNETTE GREB  Procedure(s) Performed: BILATERAL TURBINATE REDUCTION (Bilateral Nose) TOTAL ETHMOIDECTOMY (Bilateral Nose) MAXILLARY ANTROSTOMY WITH TISSUE REMOVAL (Bilateral Nose) FRONTAL RECESS SINUS EXPLORATION (Bilateral Nose) SPHENOIDECTOMY WITH TISSUE REMOVAL (Bilateral Nose) SINUS ENDOSCOPY WITH FUSION NAVIGATION (Bilateral Nose)  Patient Location: PACU  Anesthesia Type:General  Level of Consciousness: drowsy and patient cooperative  Airway & Oxygen Therapy: Patient Spontanous Breathing and Patient connected to face mask oxygen  Post-op Assessment: Report given to RN and Post -op Vital signs reviewed and stable  Post vital signs: Reviewed and stable  Last Vitals:  Vitals Value Taken Time  BP 144/79 09/03/20 1009  Temp    Pulse 69 09/03/20 1011  Resp 15 09/03/20 1011  SpO2 100 % 09/03/20 1011  Vitals shown include unvalidated device data.  Last Pain:  Vitals:   09/03/20 0745  TempSrc: Oral  PainSc: 0-No pain         Complications: No complications documented.

## 2020-09-03 NOTE — Anesthesia Postprocedure Evaluation (Signed)
Anesthesia Post Note  Patient: Deanna Brown  Procedure(s) Performed: BILATERAL TURBINATE REDUCTION (Bilateral Nose) TOTAL ETHMOIDECTOMY (Bilateral Nose) MAXILLARY ANTROSTOMY WITH TISSUE REMOVAL (Bilateral Nose) FRONTAL RECESS SINUS EXPLORATION (Bilateral Nose) SPHENOIDECTOMY WITH TISSUE REMOVAL (Bilateral Nose) SINUS ENDOSCOPY WITH FUSION NAVIGATION (Bilateral Nose)     Patient location during evaluation: PACU Anesthesia Type: General Level of consciousness: awake Pain management: pain level controlled Vital Signs Assessment: post-procedure vital signs reviewed and stable Respiratory status: spontaneous breathing and respiratory function stable Cardiovascular status: stable Postop Assessment: no apparent nausea or vomiting Anesthetic complications: no   No complications documented.  Last Vitals:  Vitals:   09/03/20 1015 09/03/20 1059  BP: 128/76 122/80  Pulse: 73 71  Resp: 14 16  Temp:  36.4 C  SpO2: 99% 96%    Last Pain:  Vitals:   09/03/20 1040  TempSrc:   PainSc: 0-No pain                 Merlinda Frederick

## 2020-09-03 NOTE — Anesthesia Preprocedure Evaluation (Addendum)
Anesthesia Evaluation  Patient identified by MRN, date of birth, ID band Patient awake    Reviewed: Allergy & Precautions, NPO status , Patient's Chart, lab work & pertinent test results  Airway Mallampati: II  TM Distance: >3 FB Neck ROM: Full    Dental  (+) Upper Dentures, Lower Dentures   Pulmonary asthma ,    Pulmonary exam normal breath sounds clear to auscultation       Cardiovascular hypertension, Pt. on medications and Pt. on home beta blockers + Past MI (2014)  + dysrhythmias (PVC's)  Rhythm:Regular Rate:Normal  H/o Takotsubo's cardiomyopathy with EF 25-30% which resolved and normalized to 60%  EKG 10/2019 NSR  ECHO 09/01/17:  - Left ventricle: The cavity size was normal. There was mild focal  basal hypertrophy of the septum. Systolic function was normal.  The estimated ejection fraction was in the range of 55% to 60%.  Wall motion was normal; there were no regional wall motion  abnormalities. Left ventricular diastolic function parameters  were normal.  - Atrial septum: No defect or patent foramen ovale was identified.    Neuro/Psych CVA, No Residual Symptoms negative psych ROS   GI/Hepatic Neg liver ROS, GERD  ,  Endo/Other  High cholesterol  Renal/GU negative Renal ROS     Musculoskeletal   Abdominal Normal abdominal exam  (+)   Peds negative pediatric ROS (+)  Hematology negative hematology ROS (+)   Anesthesia Other Findings   Reproductive/Obstetrics negative OB ROS H/o uterine cancer                             Anesthesia Physical Anesthesia Plan  ASA: III  Anesthesia Plan: General   Post-op Pain Management:    Induction: Intravenous  PONV Risk Score and Plan: 3 and Dexamethasone, Ondansetron, Midazolam and Treatment may vary due to age or medical condition  Airway Management Planned: Oral ETT  Additional Equipment: None  Intra-op Plan:    Post-operative Plan: Extubation in OR  Informed Consent: I have reviewed the patients History and Physical, chart, labs and discussed the procedure including the risks, benefits and alternatives for the proposed anesthesia with the patient or authorized representative who has indicated his/her understanding and acceptance.       Plan Discussed with: CRNA, Anesthesiologist and Surgeon  Anesthesia Plan Comments:        Anesthesia Quick Evaluation

## 2020-09-03 NOTE — H&P (Signed)
Cc: Chronic nasal obstruction, sinonasal polyps  HPI: The patient is a 75 year old female who returns today for her follow-up evaluation. The patient was previously seen for chronic nasal obstruction and sinonasal polyps.  The patient was previously treated with systemic and topical steroids with minimal improvement in her symptoms.  The patient was also placed on Singulair and Loratadine by her allergist, Dr. Mosetta Anis.  At her last visit, she was noted to have bilateral sinonasal polyps and bilateral inferior turbinate hypertrophy, obstructing more than 95% of her nasal cavities.  She subsequently underwent a sinus CT scan.  The sinus CT showed extensive paranasal sinus disease, with polypoid tissue obstructing her bilateral sphenoidal ethmoidal recesses.  Mucosal thickening and opacifications were also noted in bilateral frontoethmoidal recesses, maxillary, ethmoid and sphenoid sinuses. The patient returns today reporting no improvement in her symptoms.  She continues to have chronic nasal obstruction.  She denies any fever or visual change.    Exam: The nasal cavities were decongested and anesthetised with a combination of oxymetazoline and 4% lidocaine solution. The flexible scope was inserted into the right nasal cavity. Endoscopy of the interior nasal cavity, superior, inferior, and middle meatus was performed. The sphenoid-ethmoid recess was examined. Edematous mucosa was noted with polypoid tissue noted. Nasopharynx was clear. Turbinates were hypertrophied but without mass. The procedure was repeated on the contralateral side with polyp obstructing the  middle meatus. The patient tolerated the procedure well. Instructions were given to avoid eating or drinking for 2 hours.  Assessment: 1.  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  2.  Bilateral inferior turbinate hypertrophy.   3.  More than 95% of her nasal passageways are obstructed  bilaterally.    Plan:   1.  The nasal endoscopy findings and the CT images are reviewed with the patient.    2.  The patient should continue with her current allergy treatment regimen of Flonase, Singulair and loratadine.   3.  Based on her persistent symptoms, she may benefit from surgical interventions with bilateral endoscopic sinus surgery and turbinate reduction.  The risks, benefits, alternatives and details of the procedure are reviewed with the patient.   4.  The patient would like to proceed with the procedures.

## 2020-09-03 NOTE — Discharge Instructions (Addendum)
Post Anesthesia Home Care Instructions  Activity: Get plenty of rest for the remainder of the day. A responsible individual must stay with you for 24 hours following the procedure.  For the next 24 hours, DO NOT: -Drive a car -Operate machinery -Drink alcoholic beverages -Take any medication unless instructed by your physician -Make any legal decisions or sign important papers.  Meals: Start with liquid foods such as gelatin or soup. Progress to regular foods as tolerated. Avoid greasy, spicy, heavy foods. If nausea and/or vomiting occur, drink only clear liquids until the nausea and/or vomiting subsides. Call your physician if vomiting continues.  Special Instructions/Symptoms: Your throat may feel dry or sore from the anesthesia or the breathing tube placed in your throat during surgery. If this causes discomfort, gargle with warm salt water. The discomfort should disappear within 24 hours.  If you had a scopolamine patch placed behind your ear for the management of post- operative nausea and/or vomiting:  1. The medication in the patch is effective for 72 hours, after which it should be removed.  Wrap patch in a tissue and discard in the trash. Wash hands thoroughly with soap and water. 2. You may remove the patch earlier than 72 hours if you experience unpleasant side effects which may include dry mouth, dizziness or visual disturbances. 3. Avoid touching the patch. Wash your hands with soap and water after contact with the patch.    -----------------  POSTOPERATIVE INSTRUCTIONS FOR PATIENTS HAVING NASAL OR SINUS OPERATIONS ACTIVITY: Restrict activity at home for the first two days, resting as much as possible. Light activity is best. You may usually return to work within a week. You should refrain from nose blowing, strenuous activity, or heavy lifting greater than 20lbs for a total of one week after your operation.  If sneezing cannot be avoided, sneeze with your mouth  open. DISCOMFORT: You may experience a dull headache and pressure along with nasal congestion and discharge. These symptoms may be worse during the first week after the operation but may last as long as two to four weeks.  Please take Tylenol or the pain medication that has been prescribed for you. Do not take aspirin or aspirin containing medications since they may cause bleeding.  You may experience symptoms of post nasal drainage, nasal congestion, headaches and fatigue for two or three months after your operation.  BLEEDING: You may have some blood tinged nasal drainage for approximately two weeks after the operation.  The discharge will be worse for the first week.  Please call our office at [phone] or go to the nearest hospital emergency room if you experience any of the following: heavy, bright red blood from your nose or mouth that lasts longer than 15 minutes or coughing up or vomiting bright red blood or blood clots. GENERAL CONSIDERATIONS: 1. A gauze dressing will be placed on your upper lip to absorb any drainage after the operation. You may need to change this several times a day.  If you do not have very much drainage, you may remove the dressing.  Remember that you may gently wipe your nose with a tissue and sniff in, but DO NOT blow your nose. 2. Please keep all of your postoperative appointments.  Your final results after the operation will depend on proper follow-up.  The initial visit is usually 2 to 5 days after the operation.  During this visit, the remaining nasal packing and internal septal splints will be removed.  Your nasal and sinus cavities will   be cleaned.  During the second visit, your nasal and sinus cavities will be cleaned again. Have someone drive you to your first two postoperative appointments.  3. How you care for your nose after the operation will influence the results that you obtain.  You should follow all directions, take your medication as prescribed, and call  our office [phone] with any problems or questions. 4. You may be more comfortable sleeping with your head elevated on two pillows. 5. Do not take any medications that we have not prescribed or recommended. WARNING SIGNS: if any of the following should occur, please call our office: 1. Persistent fever greater than 102F. 2. Persistent vomiting. 3. Severe and constant pain that is not relieved by prescribed pain medication. 4. Trauma to the nose. 5. Rash or unusual side effects from any medicines.  

## 2020-09-03 NOTE — Anesthesia Procedure Notes (Signed)
Procedure Name: Intubation Date/Time: 09/03/2020 8:25 AM Performed by: Genelle Bal, CRNA Pre-anesthesia Checklist: Patient identified, Emergency Drugs available, Suction available and Patient being monitored Patient Re-evaluated:Patient Re-evaluated prior to induction Oxygen Delivery Method: Circle system utilized Preoxygenation: Pre-oxygenation with 100% oxygen Induction Type: IV induction Ventilation: Mask ventilation without difficulty and Oral airway inserted - appropriate to patient size Laryngoscope Size: Sabra Heck and 2 Grade View: Grade I Tube type: Oral Tube size: 7.0 mm Number of attempts: 1 Airway Equipment and Method: Stylet and Oral airway Placement Confirmation: ETT inserted through vocal cords under direct vision,  positive ETCO2 and breath sounds checked- equal and bilateral Secured at: 20 cm Tube secured with: Tape Dental Injury: Teeth and Oropharynx as per pre-operative assessment

## 2020-09-03 NOTE — Op Note (Signed)
DATE OF PROCEDURE: 09/03/2020  OPERATIVE REPORT   SURGEON: Leta Baptist, MD   PREOPERATIVE DIAGNOSES:  1. Bilateral chronic pansinusitis and polyposis. 2. Bilateral inferior turbinate hypertrophy.  3. Chronic nasal obstruction.  POSTOPERATIVE DIAGNOSES:  1. Bilateral chronic pansinusitis and polyposis 2. Bilateral inferior turbinate hypertrophy.  3. Chronic nasal obstruction.  PROCEDURE PERFORMED:  1. Bilateral endoscopic total ethmoidectomy and sphenoidotomy with polyp removal. 2. Bilateral endoscopic frontal sinusotomy with polyp removal. 3. Bilateral endoscopic maxillary antrostomy with polyp removal. 4. Bilateral partial inferior turbinate resection.  5. FUSION stereotactic image guidance.  ANESTHESIA: General endotracheal tube anesthesia.   COMPLICATIONS: None.   ESTIMATED BLOOD LOSS: 500 mL.   INDICATION FOR PROCEDURE: RAYETTA VEITH is a 75 y.o. female with a history of chronic nasal obstruction and sinonasal polyps.  The patient was previously treated with systemic and topical steroids with minimal improvement in her symptoms.  The patient was also placed on Singulair and Loratadine by her allergist, Dr. Mosetta Anis.  At her last visit, she was noted to have bilateral sinonasal polyps and bilateral inferior turbinate hypertrophy, obstructing more than 95% of her nasal cavities.  She subsequently underwent a sinus CT scan.  The sinus CT showed extensive paranasal sinus disease, with polypoid tissue obstructing her bilateral sphenoidal ethmoidal recesses.  Mucosal thickening and opacifications were also noted in bilateral frontoethmoidal recesses, maxillary, ethmoid and sphenoid sinuses. Based on the above findings, the decision was made for the patient to undergo the above-stated procedures. The risks, benefits, alternatives, and details of the procedures were discussed with the patient. Questions were invited and answered. Informed consent was obtained.   DESCRIPTION OF  PROCEDURE: The patient was taken to the operating room and placed supine on the operating table. General endotracheal tube anesthesia was administered by the anesthesiologist. The patient was positioned, and prepped and draped in the standard fashion for nasal surgery. Pledgets soaked with Afrin were placed in both nasal cavities for decongestion. The pledgets were subsequently removed. The FUSION stereotactic image guidance marker was placed. The image guidance system was functional throughout the case.  Using a 0 endoscope, the left nasal cavity was examined. A large amount of polypoid tissue was noted with the nasal cavity.  Polypoid tissue was also noted within the middle meatus. The polypoid tissue was removed using a combination of microdebrider and Blakesley forceps. The uncinate process was resected with a freer elevator. The maxillary antrum was entered and enlarged using a combination of backbiter and microdebrider. Polypoid tissue was removed from the left maxillary sinus.  Attention was then focused on the ethmoid sinuses. The bony partitions of the anterior and posterior ethmoid cavities were taken down. Polypoid tissue was noted and removed.  More polyps were noted to obstruct the left sphenoid opening. The polyps were removed. The sphenoid opening was entered and enlarged. More polyps were removed from within the sphenoid sinus. Attention was then focused on the frontal sinus. The frontal recess was identified and enlarged by removing the surrounding bony partitions. Polypoid tissue was removed from the frontal recess. All 4 paranasal sinuses were copiously irrigated with saline solution.  The same procedure was repeated on the right side without exception. More polyps were noted on the right side. All polyps were removed.   The inferior one half of both hypertrophied inferior turbinate was crossclamped with a Kelly clamp. The inferior one half of each inferior turbinate was then resected with  a pair of cross cutting scissors. Hemostasis was achieved with a suction cautery  device.   The care of the patient was turned over to the anesthesiologist. The patient was awakened from anesthesia without difficulty. The patient was extubated and transferred to the recovery room in good condition.   OPERATIVE FINDINGS: Bilateral chronic pansinusitis and polyposis. Bilateral inferior turbinate hypertrophy.   SPECIMEN: Bilateral sinus contents.  FOLLOWUP CARE: The patient be discharged home once she is awake and alert. The patient will be placed on Percocet  p.r.n. pain, and amoxicillin 875 mg p.o. b.i.d. for 5 days. The patient will follow up in my office in 7 days for sinus debridement.  Biran Mayberry Raynelle Bring, MD

## 2020-09-04 ENCOUNTER — Encounter (HOSPITAL_BASED_OUTPATIENT_CLINIC_OR_DEPARTMENT_OTHER): Payer: Self-pay | Admitting: Otolaryngology

## 2020-09-05 ENCOUNTER — Other Ambulatory Visit: Payer: Self-pay

## 2020-09-05 ENCOUNTER — Other Ambulatory Visit: Payer: Self-pay | Admitting: Internal Medicine

## 2020-09-05 LAB — SURGICAL PATHOLOGY

## 2020-09-08 LAB — COMPLETE METABOLIC PANEL WITH GFR
AG Ratio: 1.5 (calc) (ref 1.0–2.5)
ALT: 8 U/L (ref 6–29)
AST: 15 U/L (ref 10–35)
Albumin: 4 g/dL (ref 3.6–5.1)
Alkaline phosphatase (APISO): 50 U/L (ref 37–153)
BUN/Creatinine Ratio: 16 (calc) (ref 6–22)
BUN: 16 mg/dL (ref 7–25)
CO2: 23 mmol/L (ref 20–32)
Calcium: 9.6 mg/dL (ref 8.6–10.4)
Chloride: 108 mmol/L (ref 98–110)
Creat: 1 mg/dL — ABNORMAL HIGH (ref 0.60–0.93)
GFR, Est African American: 64 mL/min/{1.73_m2} (ref >=60)
GFR, Est Non African American: 55 mL/min/{1.73_m2} — ABNORMAL LOW (ref >=60)
Globulin: 2.6 g/dL (calc) (ref 1.9–3.7)
Glucose, Bld: 87 mg/dL (ref 65–99)
Potassium: 4.6 mmol/L (ref 3.5–5.3)
Sodium: 140 mmol/L (ref 135–146)
Total Bilirubin: 0.3 mg/dL (ref 0.2–1.2)
Total Protein: 6.6 g/dL (ref 6.1–8.1)

## 2020-09-08 LAB — CBC
HCT: 25 % — ABNORMAL LOW (ref 35.0–45.0)
Hemoglobin: 8.1 g/dL — ABNORMAL LOW (ref 11.7–15.5)
MCH: 28.7 pg (ref 27.0–33.0)
MCHC: 32.4 g/dL (ref 32.0–36.0)
MCV: 88.7 fL (ref 80.0–100.0)
MPV: 11.7 fL (ref 7.5–12.5)
Platelets: 251 10*3/uL (ref 140–400)
RBC: 2.82 10*6/uL — ABNORMAL LOW (ref 3.80–5.10)
RDW: 13 % (ref 11.0–15.0)
WBC: 7.5 10*3/uL (ref 3.8–10.8)

## 2020-09-08 LAB — LIPID PANEL
Cholesterol: 112 mg/dL (ref <200)
HDL: 38 mg/dL — ABNORMAL LOW (ref >=50)
LDL Cholesterol (Calc): 56 mg/dL (calc)
Non-HDL Cholesterol (Calc): 74 mg/dL (calc) (ref <130)
Total CHOL/HDL Ratio: 2.9 (calc) (ref <5.0)
Triglycerides: 96 mg/dL (ref <150)

## 2020-09-08 LAB — B12 AND FOLATE PANEL
Folate: 6.2 ng/mL
Vitamin B-12: 440 pg/mL (ref 200–1100)

## 2020-09-08 LAB — TSH: TSH: 1.64 mIU/L (ref 0.40–4.50)

## 2020-09-08 LAB — IRON, TOTAL/TOTAL IRON BINDING CAP
%SAT: 27 % (calc) (ref 16–45)
Iron: 70 ug/dL (ref 45–160)
TIBC: 255 mcg/dL (calc) (ref 250–450)

## 2020-09-08 LAB — ILLEGIBLE FAX NUMB

## 2020-09-08 LAB — FERRITIN: Ferritin: 60 ng/mL (ref 16–288)

## 2020-10-03 ENCOUNTER — Encounter: Payer: Self-pay | Admitting: Cardiology

## 2020-10-03 ENCOUNTER — Other Ambulatory Visit: Payer: Self-pay

## 2020-10-03 ENCOUNTER — Ambulatory Visit (INDEPENDENT_AMBULATORY_CARE_PROVIDER_SITE_OTHER): Payer: Medicare Other | Admitting: Cardiology

## 2020-10-03 VITALS — BP 120/76 | HR 73 | Ht 61.0 in | Wt 106.2 lb

## 2020-10-03 DIAGNOSIS — I493 Ventricular premature depolarization: Secondary | ICD-10-CM | POA: Diagnosis not present

## 2020-10-03 DIAGNOSIS — I10 Essential (primary) hypertension: Secondary | ICD-10-CM | POA: Diagnosis not present

## 2020-10-03 DIAGNOSIS — R600 Localized edema: Secondary | ICD-10-CM | POA: Diagnosis not present

## 2020-10-03 DIAGNOSIS — I5181 Takotsubo syndrome: Secondary | ICD-10-CM

## 2020-10-03 NOTE — Patient Instructions (Signed)

## 2020-10-03 NOTE — Progress Notes (Signed)
Cardiology Office Note:    Date:  10/03/2020   ID:  Deanna Brown, DOB 1946/05/15, MRN [id number]  PCP:  Nolene Ebbs, MD  Cardiologist:  No primary care provider on file.    Referring MD: Nolene Ebbs, MD   Chief Complaint  Patient presents with  . Cardiomyopathy  . Hypertension    History of Present Illness:    Deanna Brown is a 75 y.o. female with a hx of asthma, GERD, PVCs, HTN, moderate pulmonary HTN, PVCs, Takotsubo CM with NSTEMI 2014 with initial EF 25-30% but repeat EF 6 months later by echo was 60% and pulmonary HTN had resolved.    She is here today for followup and is doing well.  She denies any chest pain or pressure, SOB, DOE, PND, orthopnea, LE edema, dizziness, palpitations or syncope. She is compliant with her meds and is tolerating meds with no SE.    Past Medical History:  Diagnosis Date  . Asthma   . Ejection fraction   . Environmental allergies   . GERD (gastroesophageal reflux disease)   . High cholesterol   . Hypertension   . Myocardial infarction [facility])    01-10-13- [facility],cardiology -yearly visits next 5'2016, denies any further problems  . Pulmonary HTN ([place])    a. Mod pulm HTN by cath 01/2013 (PASP 77mmHg).10-30-14 tolerates walking 30 minutes contiuously x 3-4 times weekly, denies SOB or issues.  Marland Kitchen PVC's (premature ventricular contractions)    a. Noted during 01/2013 admission.  . Seasonal allergies   . Stroke [facility])    "Slurred speech, unsteady gait "01-15-2013- no residual effects.  . Takotsubo cardiomyopathy    a. NSTEMI 2/2 takotsubo 01/2013, no significant CAD by cath, EF 25%;  b. 01/2013 Echo: EF 25-30%, mod LVH, Sev HK in all segments except base. Mild/mod RV dysfxn, PASP 45mmHg.;  c.  Echo 07/2013: normal LVF with EF 60% and normal PAP  . Uterine cancer [facility])     Past Surgical History:  Procedure Laterality Date  . ABDOMINAL HYSTERECTOMY  1999  . BALLOON DILATION N/A 11/30/2014   Procedure: BALLOON DILATION;  Surgeon: Wilford Corner, MD;  Location[facility] ENDOSCOPY;  Service: Endoscopy;  Laterality: N/A;  . ESOPHAGOGASTRODUODENOSCOPY (EGD) WITH PROPOFOL N/A 11/01/2014   Procedure: ESOPHAGOGASTRODUODENOSCOPY (EGD) WITH PROPOFOL;  Surgeon: Laurence Spates, MD;  Location: [facility] ENDOSCOPY;  Service: Endoscopy;  Laterality: N/A;  . ESOPHAGOGASTRODUODENOSCOPY (EGD) WITH PROPOFOL N/A 11/30/2014   Procedure: ESOPHAGOGASTRODUODENOSCOPY (EGD) WITH PROPOFOL;  Surgeon: Wilford Corner, MD;  Location[facility] ENDOSCOPY;  Service: Endoscopy;  Laterality: N/A;  . ETHMOIDECTOMY Bilateral 09/03/2020   Procedure: TOTAL ETHMOIDECTOMY;  Surgeon: Leta Baptist, MD;  Location: [place];  Service: ENT;  Laterality: Bilateral;  . FRONTAL SINUS EXPLORATION Bilateral 09/03/2020   Procedure: FRONTAL RECESS SINUS EXPLORATION;  Surgeon: Leta Baptist, MD;  Location: [place];  Service: ENT;  Laterality: Bilateral;  . LEFT AND RIGHT HEART CATHETERIZATION WITH CORONARY ANGIOGRAM N/A 01/11/2013   Procedure: LEFT AND RIGHT HEART CATHETERIZATION WITH CORONARY ANGIOGRAM;  Surgeon: Minus Breeding, MD;  Location: [facility] CATH LAB;  Service: Cardiovascular;  Laterality: N/A;  . MAXILLARY ANTROSTOMY Bilateral 09/03/2020   Procedure: MAXILLARY ANTROSTOMY WITH TISSUE REMOVAL;  Surgeon: Leta Baptist, MD;  Location: [place];  Service: ENT;  Laterality: Bilateral;  . SAVORY DILATION N/A 11/01/2014   Procedure: SAVORY DILATION;  Surgeon: Laurence Spates, MD;  Location: [facility] ENDOSCOPY;  Service: Endoscopy;  Laterality: N/A;  . SAVORY DILATION N/A 11/30/2014   Procedure:  SAVORY DILATION;  Surgeon: Wilford Corner, MD;  Location: [facility] ENDOSCOPY;  Service: Endoscopy;  Laterality: N/A;  . SINUS ENDO WITH FUSION Bilateral 09/03/2020   Procedure: SINUS ENDOSCOPY WITH FUSION NAVIGATION;  Surgeon: Leta Baptist, MD;  Location: [place];  Service: ENT;  Laterality: Bilateral;  . SPHENOIDECTOMY Bilateral 09/03/2020   Procedure: SPHENOIDECTOMY WITH TISSUE  REMOVAL;  Surgeon: Leta Baptist, MD;  Location: [place];  Service: ENT;  Laterality: Bilateral;  . TURBINATE REDUCTION Bilateral 09/03/2020   Procedure: BILATERAL TURBINATE REDUCTION;  Surgeon: Leta Baptist, MD;  Location: [place];  Service: ENT;  Laterality: Bilateral;    Current Medications: Current Meds  Medication Sig  . albuterol (PROVENTIL HFA;VENTOLIN HFA) 108 (90 Base) MCG/ACT inhaler Inhale 2 puffs into the lungs every 6 (six) hours as needed for wheezing or shortness of breath.  Marland Kitchen alendronate (FOSAMAX) 70 MG tablet Take 70 mg by mouth every 7 (seven) days. Take with a full glass of water on an empty stomach.  Marland Kitchen amLODipine (NORVASC) 5 MG tablet Take 1 tablet (5 mg total) by mouth at bedtime.  . ASPIRIN LOW DOSE 81 MG EC tablet Take 81 mg by mouth daily.  Marland Kitchen atorvastatin (LIPITOR) 40 MG tablet TAKE 1 TABLET BY MOUTH AT 6 PM  . carvedilol (COREG) 25 MG tablet Take 1 tablet (25 mg total) by mouth 2 (two) times daily with a meal.  . fluticasone (FLONASE) 50 MCG/ACT nasal spray Place into both nostrils daily.  . hydrochlorothiazide (HYDRODIURIL) 12.5 MG tablet Take 1 tablet (12.5 mg total) by mouth daily as needed (for swelling).  Marland Kitchen latanoprost (XALATAN) 0.005 % ophthalmic solution 1 drop at bedtime.  Marland Kitchen loratadine (CLARITIN) 10 MG tablet Take 10 mg by mouth daily.  Marland Kitchen losartan (COZAAR) 100 MG tablet Take 1 tablet (100 mg total) by mouth daily.  . montelukast (SINGULAIR) 10 MG tablet Take 10 mg by mouth at bedtime.  . potassium chloride (KLOR-CON) 20 MEQ packet Take 20 mEq by mouth daily.  . SYMBICORT 160-4.5 MCG/ACT inhaler INHALE 2 PUFFS TWICE DAILY TO PREVENT COUGH OR WHEEZE. RINSE, GARGLE, AND SPIT AFTER USE.     Allergies:   Guaifenesin & derivatives, Cetirizine hcl, Plavix [clopidogrel bisulfate], and Xyzal [cetirizine hcl]   Social History   Socioeconomic History  . Marital status: Widowed    Spouse name: Not on file  . Number of children: 3  . Years  of education: 12th  . Highest education level: Not on file  Occupational History  . Occupation: clean Librarian, academic: [employer]  Tobacco Use  . Smoking status: Never Smoker  . Smokeless tobacco: Never Used  Vaping Use  . Vaping Use: Never used  Substance and Sexual Activity  . Alcohol use: No  . Drug use: No  . Sexual activity: Never  Other Topics Concern  . Not on file  Social History Narrative   Lives in [place] with her son.  Her dtr also lives nearby.   Social Determinants of Health   Financial Resource Strain: Not on file  Food Insecurity: Not on file  Transportation Needs: Not on file  Physical Activity: Not on file  Stress: Not on file  Social Connections: Not on file     Family History: The patient's family history includes Dementia in her father; Healthy in her brother, brother, brother, sister, sister, and sister; Heart attack in her mother; Hypertension in her mother.  ROS:   Please see the history of  present illness.    ROS  All other systems reviewed and negative.   EKGs/Labs/Other Studies Reviewed:    The following studies were reviewed today: EKG, outside labs from PCP on [facility]  2D echo 08/2017 Study Conclusions   - Left ventricle: The cavity size was normal. There was mild focal  basal hypertrophy of the septum. Systolic function was normal.  The estimated ejection fraction was in the range of 55% to 60%.  Wall motion was normal; there were no regional wall motion  abnormalities. Left ventricular diastolic function parameters  were normal.  - Atrial septum: No defect or patent foramen ovale was identified.   EKG:  EKG is  ordered today.  The ekg ordered today demonstrates NSR with no ST changes  Recent Labs: 09/05/2020: ALT 8; BUN 16; Creat 1.00; Hemoglobin 8.1; Platelets 251; Potassium 4.6; Sodium 140; TSH 1.64   Recent Lipid Panel    Component Value Date/Time   CHOL 112 09/05/2020 1526   TRIG 96 09/05/2020 1526   HDL 38 (L)  09/05/2020 1526   CHOLHDL 2.9 09/05/2020 1526   VLDL 12.8 03/15/2013 1025   LDLCALC 56 09/05/2020 1526    Physical Exam:    VS:  BP 120/76   Pulse 73   Ht 5\' 1"  (1.549 m)   Wt 106 lb 3.2 oz (48.2 kg)   SpO2 99%   BMI 20.07 kg/m     Wt Readings from Last 3 Encounters:  10/03/20 106 lb 3.2 oz (48.2 kg)  09/03/20 107 lb 5.8 oz (48.7 kg)  10/05/19 125 lb 9.6 oz (57 kg)     GEN: Well nourished, well developed in no acute distress HEENT: Normal NECK: No JVD; No carotid bruits LYMPHATICS: No lymphadenopathy CARDIAC:RRR, no murmurs, rubs, gallops RESPIRATORY:  Clear to auscultation without rales, wheezing or rhonchi  ABDOMEN: Soft, non-tender, non-distended MUSCULOSKELETAL:  No edema; No deformity  SKIN: Warm and dry NEUROLOGIC:  Alert and oriented x 3 PSYCHIATRIC:  Normal affect    ASSESSMENT:    1. Takotsubo cardiomyopathy (history of 2014)   2. Primary hypertension   3. PVC's (premature ventricular contractions)   4. Edema of extremities    PLAN:    In order of problems listed above:  1. Takotubo DCM -S/P NSTEMI 2014 with EF 25-30% with normal coronary arteries at time of cath -followup echo showed normalization of LVF  2.  HTN -BP is well controlled on exam today -continue Carvedilol 25mg  BID, Cozaar 100mg  daily, amlodipine 5mg  daily -SCr was 1 and K+ 4.6 on 09/05/2020  3.  PVCs -she has not had any palpitations -continue Carvedilol for suppression  4.  Chronic lower extremity edema -her LE edema is well controlled on diuretics and I have recommended that she just use it PRN -encouraged to follow < 2gm Na diet   Medication Adjustments/Labs and Tests Ordered: Current medicines are reviewed at length with the patient today.  Concerns regarding medicines are outlined above.  Orders Placed This Encounter  Procedures  . EKG 12-Lead   No orders of the defined types were placed in this encounter.   Signed, Fransico Him, MD  10/03/2020 11:14 AM    [place]

## 2020-11-16 ENCOUNTER — Other Ambulatory Visit: Payer: Self-pay | Admitting: Cardiology

## 2020-11-21 ENCOUNTER — Other Ambulatory Visit: Payer: Self-pay | Admitting: Cardiology

## 2020-11-27 ENCOUNTER — Other Ambulatory Visit: Payer: Self-pay | Admitting: Cardiology

## 2020-11-28 ENCOUNTER — Other Ambulatory Visit: Payer: Self-pay | Admitting: Cardiology

## 2021-01-06 ENCOUNTER — Other Ambulatory Visit: Payer: Self-pay | Admitting: Cardiology

## 2021-06-30 ENCOUNTER — Emergency Department (HOSPITAL_COMMUNITY): Payer: Medicare Other

## 2021-06-30 ENCOUNTER — Other Ambulatory Visit: Payer: Self-pay

## 2021-06-30 ENCOUNTER — Emergency Department (HOSPITAL_COMMUNITY)
Admission: EM | Admit: 2021-06-30 | Discharge: 2021-06-30 | Disposition: A | Discharge status: Home / Self Care | Payer: Medicare Other | Attending: Emergency Medicine | Admitting: Emergency Medicine

## 2021-06-30 ENCOUNTER — Encounter (HOSPITAL_COMMUNITY): Payer: Self-pay

## 2021-06-30 DIAGNOSIS — J101 Influenza due to other identified influenza virus with other respiratory manifestations: Secondary | ICD-10-CM | POA: Diagnosis not present

## 2021-06-30 DIAGNOSIS — Z7951 Long term (current) use of inhaled steroids: Secondary | ICD-10-CM | POA: Diagnosis not present

## 2021-06-30 DIAGNOSIS — J45901 Unspecified asthma with (acute) exacerbation: Secondary | ICD-10-CM | POA: Diagnosis not present

## 2021-06-30 DIAGNOSIS — I10 Essential (primary) hypertension: Secondary | ICD-10-CM | POA: Diagnosis not present

## 2021-06-30 DIAGNOSIS — Z8542 Personal history of malignant neoplasm of other parts of uterus: Secondary | ICD-10-CM | POA: Insufficient documentation

## 2021-06-30 DIAGNOSIS — J029 Acute pharyngitis, unspecified: Secondary | ICD-10-CM | POA: Diagnosis present

## 2021-06-30 DIAGNOSIS — J45909 Unspecified asthma, uncomplicated: Secondary | ICD-10-CM | POA: Diagnosis not present

## 2021-06-30 DIAGNOSIS — Z79899 Other long term (current) drug therapy: Secondary | ICD-10-CM | POA: Diagnosis not present

## 2021-06-30 DIAGNOSIS — Z20822 Contact with and (suspected) exposure to covid-19: Secondary | ICD-10-CM | POA: Diagnosis not present

## 2021-06-30 LAB — RESP PANEL BY RT-PCR (FLU A&B, COVID) ARPGX2
Influenza A by PCR: POSITIVE — AB
Influenza B by PCR: NEGATIVE
SARS Coronavirus 2 by RT PCR: NEGATIVE

## 2021-06-30 MED ORDER — LIDOCAINE VISCOUS HCL 2 % MT SOLN
15.0000 mL | Freq: Once | OROMUCOSAL | Status: DC
  Filled 2021-06-30: qty 15

## 2021-06-30 MED ORDER — ACETAMINOPHEN 325 MG PO TABS
650.0000 mg | ORAL_TABLET | Freq: Once | ORAL | Status: AC | PRN
  Administered 2021-06-30: 12:00:00 650 mg via ORAL
  Filled 2021-06-30: qty 2

## 2021-06-30 MED ORDER — OSELTAMIVIR PHOSPHATE 75 MG PO CAPS: 75.0000 mg | ORAL_CAPSULE | Freq: Two times a day (BID) | ORAL | 0 refills | Status: DC

## 2021-06-30 NOTE — ED Provider Notes (Signed)
Emergency Medicine Provider Triage Evaluation Note  Deanna Brown , a 75 y.o. female  was evaluated in triage.  Pt complains of cold sxs.  Review of Systems  Positive: Fever, chills, body aches, congestion, cough Negative: N/v/d, dysuria  Physical Exam  BP 127/85   Pulse 92   Temp (!) 102.1 F (38.9 C) (Oral)   Resp 14   SpO2 95%  Gen:   Awake, no distress   Resp:  Normal effort  MSK:   Moves extremities without difficulty  Other:    Medical Decision Making  Medically screening exam initiated at 11:37 AM.  Appropriate orders placed.  Deanna Brown was informed that the remainder of the evaluation will be completed by another provider, this initial triage assessment does not replace that evaluation, and the importance of remaining in the ED until their evaluation is complete.  Pt here with cold sxs x 1 days.  Son was sick with same.  Has fever of 102.1.  no sob   Deanna Moras, PA-C 06/30/21 1138    Deanna Dusky, MD 06/30/21 1254

## 2021-06-30 NOTE — Discharge Instructions (Addendum)
It was a pleasure taking care of you today. As discussed, your Flu test was positive. I am sending you home with Tamiflu. I am also sending you home with cough medication. You may take over the counter tylenol as needed for pain. Follow-up with PCP if symptoms do not improve over the next week. Return to the ER for new or worsening symptoms.

## 2021-06-30 NOTE — ED Notes (Signed)
Patient Alert and oriented to baseline. Stable and ambulatory to baseline. Patient verbalized understanding of the discharge instructions.  Patient belongings were taken by the patient.   

## 2021-06-30 NOTE — ED Provider Notes (Signed)
[place] EMERGENCY DEPARTMENT Provider Note   CSN: [id number] Arrival date & time: 06/30/21  1039     History No chief complaint on file.   Deanna Brown is a 75 y.o. female with a past medical history significant for asthma, hyperlipidemia, hypertension, previous CVA, previous MI, and Takotsubo cardiomyopathy who presents to the ED due to sore throat, nasal congestion, fever, chills, and myalgias that started yesterday.  Patient states son was sick with similar symptoms.  Denies chest pain and shortness of breath.  Denies difficulties swallowing and changes to phonation.  No nausea, vomiting, or diarrhea.  Denies abdominal pain.  She is vaccinated against COVID-19 in addition to her booster.  She is unvaccinated against influenza.  No treatment prior to arrival.  No aggravating or alleviating factors.  History obtained from patient and past medical records. No interpreter used during encounter.       Past Medical History:  Diagnosis Date   Asthma    Ejection fraction    Environmental allergies    GERD (gastroesophageal reflux disease)    High cholesterol    Hypertension    Myocardial infarction [facility])    01-10-13- [facility],cardiology -yearly visits next 5'2016, denies any further problems   Pulmonary HTN ([place])    a. Mod pulm HTN by cath 01/2013 (PASP 35mmHg).10-30-14 tolerates walking 30 minutes contiuously x 3-4 times weekly, denies SOB or issues.   PVC's (premature ventricular contractions)    a. Noted during 01/2013 admission.   Seasonal allergies    Stroke [facility])    "Slurred speech, unsteady gait "01-15-2013- no residual effects.   Takotsubo cardiomyopathy    a. NSTEMI 2/2 takotsubo 01/2013, no significant CAD by cath, EF 25%;  b. 01/2013 Echo: EF 25-30%, mod LVH, Sev HK in all segments except base. Mild/mod RV dysfxn, PASP 51mmHg.;  c.  Echo 07/2013: normal LVF with EF 60% and normal PAP   Uterine cancer [facility])     Patient Active Problem List   Diagnosis  Date Noted   Edema of extremities 08/20/2017   Asthma exacerbation 10/30/2015   Acute respiratory failure with hypoxia ([place]) 10/30/2015   Acute asthma exacerbation 10/30/2015   Neck pain on left side 03/23/2015   Stricture and stenosis of esophagus 11/30/2014   Difficulty in swallowing 11/30/2014   Ejection fraction    Aortic valve sclerosis 07/12/2013   History of CVA (cerebrovascular accident) 06/13/2013   PVC's (premature ventricular contractions)    Uterine cancer ([place])    High cholesterol    Hypertension    Takotsubo cardiomyopathy (history of 2014)    GERD (gastroesophageal reflux disease) 01/15/2013    Past Surgical History:  Procedure Laterality Date   ABDOMINAL HYSTERECTOMY  1999   BALLOON DILATION N/A 11/30/2014   Procedure: BALLOON DILATION;  Surgeon: Wilford Corner, MD;  Location: [facility] ENDOSCOPY;  Service: Endoscopy;  Laterality: N/A;   ESOPHAGOGASTRODUODENOSCOPY (EGD) WITH PROPOFOL N/A 11/01/2014   Procedure: ESOPHAGOGASTRODUODENOSCOPY (EGD) WITH PROPOFOL;  Surgeon: Laurence Spates, MD;  Location: [facility] ENDOSCOPY;  Service: Endoscopy;  Laterality: N/A;   ESOPHAGOGASTRODUODENOSCOPY (EGD) WITH PROPOFOL N/A 11/30/2014   Procedure: ESOPHAGOGASTRODUODENOSCOPY (EGD) WITH PROPOFOL;  Surgeon: Wilford Corner, MD;  Location: [facility] ENDOSCOPY;  Service: Endoscopy;  Laterality: N/A;   ETHMOIDECTOMY Bilateral 09/03/2020   Procedure: TOTAL ETHMOIDECTOMY;  Surgeon: Leta Baptist, MD;  Location: [place];  Service: ENT;  Laterality: Bilateral;   FRONTAL SINUS EXPLORATION Bilateral 09/03/2020   Procedure: FRONTAL RECESS SINUS EXPLORATION;  Surgeon: Leta Baptist, MD;  Location: [place];  Service: ENT;  Laterality: Bilateral;   LEFT AND RIGHT HEART CATHETERIZATION WITH CORONARY ANGIOGRAM N/A 01/11/2013   Procedure: LEFT AND RIGHT HEART CATHETERIZATION WITH CORONARY ANGIOGRAM;  Surgeon: Minus Breeding, MD;  Location: [facility] CATH LAB;  Service: Cardiovascular;  Laterality: N/A;    MAXILLARY ANTROSTOMY Bilateral 09/03/2020   Procedure: MAXILLARY ANTROSTOMY WITH TISSUE REMOVAL;  Surgeon: Leta Baptist, MD;  Location: [place];  Service: ENT;  Laterality: Bilateral;   SAVORY DILATION N/A 11/01/2014   Procedure: SAVORY DILATION;  Surgeon: Laurence Spates, MD;  Location: [facility] ENDOSCOPY;  Service: Endoscopy;  Laterality: N/A;   SAVORY DILATION N/A 11/30/2014   Procedure: SAVORY DILATION;  Surgeon: Wilford Corner, MD;  Location: [facility] ENDOSCOPY;  Service: Endoscopy;  Laterality: N/A;   SINUS ENDO WITH FUSION Bilateral 09/03/2020   Procedure: SINUS ENDOSCOPY WITH FUSION NAVIGATION;  Surgeon: Leta Baptist, MD;  Location: [place];  Service: ENT;  Laterality: Bilateral;   SPHENOIDECTOMY Bilateral 09/03/2020   Procedure: SPHENOIDECTOMY WITH TISSUE REMOVAL;  Surgeon: Leta Baptist, MD;  Location: [place];  Service: ENT;  Laterality: Bilateral;   TURBINATE REDUCTION Bilateral 09/03/2020   Procedure: BILATERAL TURBINATE REDUCTION;  Surgeon: Leta Baptist, MD;  Location: [place];  Service: ENT;  Laterality: Bilateral;     OB History   No obstetric history on file.     Family History  Problem Relation Age of Onset   Heart attack Mother    Hypertension Mother    Dementia Father    Healthy Sister    Healthy Brother    Healthy Sister    Healthy Sister    Healthy Brother    Healthy Brother     Social History   Tobacco Use   Smoking status: Never   Smokeless tobacco: Never  Vaping Use   Vaping Use: Never used  Substance Use Topics   Alcohol use: No   Drug use: No    Home Medications Prior to Admission medications   Medication Sig Start Date End Date Taking? Authorizing Provider  oseltamivir (TAMIFLU) 75 MG capsule Take 1 capsule (75 mg total) by mouth every 12 (twelve) hours. 06/30/21  Yes Sanii Kukla C, PA-C  albuterol (PROVENTIL HFA;VENTOLIN HFA) 108 (90 Base) MCG/ACT inhaler Inhale 2 puffs into the lungs every 6 (six)  hours as needed for wheezing or shortness of breath.    [provider]  alendronate (FOSAMAX) 70 MG tablet Take 70 mg by mouth every 7 (seven) days. Take with a full glass of water on an empty stomach.    [provider]  amLODipine (NORVASC) 5 MG tablet TAKE 1 TABLET BY MOUTH EVERYDAY AT BEDTIME 11/28/20   Turner, Traci R, MD  ASPIRIN LOW DOSE 81 MG EC tablet Take 81 mg by mouth daily. 09/14/20   [provider]  atorvastatin (LIPITOR) 40 MG tablet TAKE 1 TABLET BY MOUTH AT 6 PM 11/21/20   Sueanne Margarita, MD  carvedilol (COREG) 25 MG tablet Take 1 tablet (25 mg total) by mouth 2 (two) times daily with a meal. 10/05/19   Turner, Eber Hong, MD  fluticasone (FLONASE) 50 MCG/ACT nasal spray Place into both nostrils daily.    [provider]  hydrochlorothiazide (HYDRODIURIL) 12.5 MG tablet TAKE 1 TABLET BY MOUTH DAILY AS NEEDED FOR SWELLING 11/27/20   Turner, Eber Hong, MD  latanoprost (XALATAN) 0.005 % ophthalmic solution 1 drop at bedtime. 09/20/19   [provider]  loratadine (  CLARITIN) 10 MG tablet Take 10 mg by mouth daily.    [provider]  losartan (COZAAR) 100 MG tablet TAKE 1 TABLET BY MOUTH EVERY DAY 11/16/20   Sueanne Margarita, MD  montelukast (SINGULAIR) 10 MG tablet Take 10 mg by mouth at bedtime.    [provider]  potassium chloride (KLOR-CON) 20 MEQ packet TAKE 1 PACKET (20 MEQ) BY MOUTH EVERY DAY 01/08/21   Sueanne Margarita, MD  SYMBICORT 160-4.5 MCG/ACT inhaler INHALE 2 PUFFS TWICE DAILY TO PREVENT COUGH OR WHEEZE. RINSE, GARGLE, AND SPIT AFTER USE. 07/31/15   Charlies Silvers, MD    Allergies    Guaifenesin & derivatives, Cetirizine hcl, Plavix [clopidogrel bisulfate], and Xyzal [cetirizine hcl]  Review of Systems   Review of Systems  Constitutional:  Positive for chills and fever.  HENT:  Positive for congestion and sore throat. Negative for trouble swallowing and voice change.   Respiratory:  Negative for cough and  shortness of breath.   Cardiovascular:  Negative for chest pain.  Genitourinary:  Negative for dysuria.  Musculoskeletal:  Positive for myalgias.  All other systems reviewed and are negative.  Physical Exam Updated Vital Signs BP 109/60   Pulse 75   Temp 99.2 F (37.3 C) (Oral)   Resp (!) 23   SpO2 98%   Physical Exam Vitals and nursing note reviewed.  Constitutional:      General: She is not in acute distress.    Appearance: She is not ill-appearing.  HENT:     Head: Normocephalic.  Eyes:     Pupils: Pupils are equal, round, and reactive to light.  Cardiovascular:     Rate and Rhythm: Normal rate and regular rhythm.     Pulses: Normal pulses.     Heart sounds: Normal heart sounds. No murmur heard.   No friction rub. No gallop.  Pulmonary:     Effort: Pulmonary effort is normal.     Breath sounds: Normal breath sounds.     Comments: Respirations equal and unlabored, patient able to speak in full sentences, lungs clear to auscultation bilaterally Abdominal:     General: Abdomen is flat. There is no distension.     Palpations: Abdomen is soft.     Tenderness: There is no abdominal tenderness. There is no guarding or rebound.  Musculoskeletal:        General: Normal range of motion.     Cervical back: Neck supple.  Skin:    General: Skin is warm and dry.  Neurological:     General: No focal deficit present.     Mental Status: She is alert.  Psychiatric:        Mood and Affect: Mood normal.        Behavior: Behavior normal.    ED Results / Procedures / Treatments   Labs (all labs ordered are listed, but only abnormal results are displayed) Labs Reviewed  RESP PANEL BY RT-PCR (FLU A&B, COVID) ARPGX2 - Abnormal; Notable for the following components:      Result Value   Influenza A by PCR POSITIVE (*)    All other components within normal limits    EKG None  Radiology DG Chest Portable 1 View  Result Date: 06/30/2021 CLINICAL DATA:  Cough, fever and chills.  EXAM: PORTABLE CHEST 1 VIEW COMPARISON:  05/09/2020 FINDINGS: Cardiac silhouette is normal in size and configuration. Small hiatal hernia. No mediastinal or hilar masses. Clear lungs.  No convincing pleural effusion and no pneumothorax.  Skeletal structures are grossly intact. IMPRESSION: No active disease. Electronically Signed   By: Lajean Manes M.D.   On: 06/30/2021 13:17    Procedures Procedures   Medications Ordered in ED Medications  lidocaine (XYLOCAINE) 2 % viscous mouth solution 15 mL (0 mLs Mouth/Throat Hold 06/30/21 1342)  acetaminophen (TYLENOL) tablet 650 mg (650 mg Oral Given 06/30/21 1133)    ED Course  I have reviewed the triage vital signs and the nursing notes.  Pertinent labs & imaging results that were available during my care of the patient were reviewed by me and considered in my medical decision making (see chart for details).  Clinical Course as of 06/30/21 1350  Sun Jun 30, 2021  1348 Influenza A By PCR(!): POSITIVE [CA]    Clinical Course User Index [CA] Suzy Bouchard, PA-C   MDM Rules/Calculators/A&P                          75 year old female presents to the ED due to sore throat, myalgias, fever, chills, and nasal congestion x1 day.  Son sick with similar symptoms.  Upon arrival, patient febrile 102.1 F with otherwise unremarkable vitals.  Patient in no acute distress.  Patient nontoxic-appearing.  Benign physical exam.  COVID/influenza test ordered at triage.  Added chest x-ray.  If COVID/flu are negative, will add strep and routine labs to rule out any signs of bacterial infection.  Viscous lidocaine and Tylenol given.  Positive for influenza A.  Suspect symptoms related to flu infection.  Chest x-ray personally reviewed which is negative for signs of pneumonia.  Low suspicion for bacterial infection.  Patient discharged with Tamiflu and cough medication.  No signs of respiratory distress. Patient able to tolerate po. Advised patient follow-up with  PCP symptoms not improve over the next week. Strict ED precautions discussed with patient. Patient states understanding and agrees to plan. Patient discharged home in no acute distress and stable vitals.  Discussed with Dr. Armandina Gemma who agrees with assessment and plan.  Final Clinical Impression(s) / ED Diagnoses Final diagnoses:  Influenza A    Rx / DC Orders ED Discharge Orders          Ordered    oseltamivir (TAMIFLU) 75 MG capsule  Every 12 hours        06/30/21 1348             Karie Kirks 06/30/21 1351    Regan Lemming, MD 06/30/21 1758

## 2021-06-30 NOTE — ED Triage Notes (Signed)
Patient complains of fever, chills, cough and drainage in throat x 2 days. Patient alert and oriented. Nonsmoker. NAD

## 2021-10-02 ENCOUNTER — Encounter: Payer: Self-pay | Admitting: Cardiology

## 2021-10-15 ENCOUNTER — Ambulatory Visit: Payer: Medicare Other | Admitting: Cardiology

## 2021-11-01 ENCOUNTER — Other Ambulatory Visit: Payer: Self-pay | Admitting: Cardiology

## 2021-11-24 NOTE — Progress Notes (Deleted)
Cardiology Office Note:    Date:  11/24/2021   ID:  Deanna Brown, DOB [birth year], MRN [id number]  PCP:  Nolene Ebbs, MD   [facility] Providers Cardiologist:  None { Click to update primary MD,subspecialty MD or APP then REFRESH:1}    Referring MD: Nolene Ebbs, MD   Chief Complaint: ***  History of Present Illness:    Deanna Brown is a 76 y.o. female with a hx of Takotsubo cardiomyopathy with NSTEMI 2014 with initial EF 25-30% but improved to 60%, pulmonary hypertension, essential hypertension, PVCs, acute respiratory failure, aortic valve sclerosis, and stroke.  Admission 6/9-6/13/2014 with NSTEMI.  She presented with complaints of palpitations and troponins were markedly elevated.  Left heart catheterization 01/2013 revealed proximal RCA 20% stenosis.  There was moderate pulmonary hypertension and EF was 25% with LV wall motion abnormalities consistent with Takotsubo cardiomyopathy, confirmed by echo. Readmitted 6/14-6/17/2014 slurred speech, sided weakness and right facial droop. Symptoms resolved, head CT revealed no acute abnormality. MRI revealed acute nonhemorrhagic infarcts involving the medial aspect of the right thalamus and left occipital lobe. Echo 01/17/2013 revealed moderate LVH, EF 45 to 50%, diffuse HK, PASP 37.  Carotid Dopplers revealed R ICA 40 to 59%.  Cardiology consultant felt echo demonstrated LV thrombus and she was placed on Plavix in lieu of aspirin for secondary stroke prevention.  Echo 07/2013 revealed LV EF had returned to normal.  She was last seen in our office 10/03/2020 by Dr. Radford Pax at which time no changes were made to medical therapy and 1 year follow-up was recommended.   Today, she is here   Past Medical History:  Diagnosis Date   Asthma    Ejection fraction    Environmental allergies    GERD (gastroesophageal reflux disease)    High cholesterol    Hypertension    Myocardial infarction [facility])    01-10-13- [facility],cardiology -yearly  visits next 5'2016, denies any further problems   Pulmonary HTN ([place])    a. Mod pulm HTN by cath 01/2013 (PASP 60mHg).10-30-14 tolerates walking 30 minutes contiuously x 3-4 times weekly, denies SOB or issues.   PVC's (premature ventricular contractions)    a. Noted during 01/2013 admission.   Seasonal allergies    Stroke ([facility]    "Slurred speech, unsteady gait "01-15-2013- no residual effects.   Takotsubo cardiomyopathy    a. NSTEMI 2/2 takotsubo 01/2013, no significant CAD by cath, EF 25%;  b. 01/2013 Echo: EF 25-30%, mod LVH, Sev HK in all segments except base. Mild/mod RV dysfxn, PASP 340mg.;  c.  Echo 07/2013: normal LVF with EF 60% and normal PAP   [place]    Past Surgical History:  Procedure Laterality Date   ABDOMINAL HYSTERECTOMY  1999   BALLOON DILATION N/A 11/30/2014   Procedure: BALLOON DILATION;  Surgeon: ViWilford CornerMD;  [facility];  Service: Endoscopy;  Laterality: N/A;   ESOPHAGOGASTRODUODENOSCOPY (EGD) WITH PROPOFOL N/A 11/01/2014   Procedure: ESOPHAGOGASTRODUODENOSCOPY (EGD) WITH PROPOFOL;  Surgeon: JaLaurence SpatesMD;  [facility] ENDOSCOPY;  Service: Endoscopy;  Laterality: N/A;   ESOPHAGOGASTRODUODENOSCOPY (EGD) WITH PROPOFOL N/A 11/30/2014   Procedure: ESOPHAGOGASTRODUODENOSCOPY (EGD) WITH PROPOFOL;  Surgeon: ViWilford CornerMD;  [facility];  Service: Endoscopy;  Laterality: N/A;   ETHMOIDECTOMY Bilateral 09/03/2020   Procedure: TOTAL ETHMOIDECTOMY;  Surgeon: TeLeta BaptistMD;  [place];  Laterality: Bilateral;   FRONTAL SINUS EXPLORATION Bilateral 09/03/2020   Procedure: FRONTAL RECESS SINUS EXPLORATION;  Surgeon:  Leta Baptist, MD;  Location: [place];  Service: ENT;  Laterality: Bilateral;   LEFT AND RIGHT HEART CATHETERIZATION WITH CORONARY ANGIOGRAM N/A 01/11/2013   Procedure: LEFT AND RIGHT HEART CATHETERIZATION WITH CORONARY ANGIOGRAM;  Surgeon: Minus Breeding, MD;  Location:  [facility] CATH LAB;  Service: Cardiovascular;  Laterality: N/A;   MAXILLARY ANTROSTOMY Bilateral 09/03/2020   Procedure: MAXILLARY ANTROSTOMY WITH TISSUE REMOVAL;  Surgeon: Leta Baptist, MD;  Location: [place];  Service: ENT;  Laterality: Bilateral;   SAVORY DILATION N/A 11/01/2014   Procedure: SAVORY DILATION;  Surgeon: Laurence Spates, MD;  [facility] ENDOSCOPY;  Service: Endoscopy;  Laterality: N/A;   SAVORY DILATION N/A 11/30/2014   Procedure: SAVORY DILATION;  Surgeon: Wilford Corner, MD;  [facility] ENDOSCOPY;  Service: Endoscopy;  Laterality: N/A;   SINUS ENDO WITH FUSION Bilateral 09/03/2020   Procedure: SINUS ENDOSCOPY WITH FUSION NAVIGATION;  Surgeon: Leta Baptist, MD;  Location: [place];  Service: ENT;  Laterality: Bilateral;   SPHENOIDECTOMY Bilateral 09/03/2020   Procedure: SPHENOIDECTOMY WITH TISSUE REMOVAL;  Surgeon: Leta Baptist, MD;  Location: [place];  Service: ENT;  Laterality: Bilateral;   TURBINATE REDUCTION Bilateral 09/03/2020   Procedure: BILATERAL TURBINATE REDUCTION;  Surgeon: Leta Baptist, MD;  Location: [place];  Service: ENT;  Laterality: Bilateral;    Current Medications: No outpatient medications have been marked as taking for the 11/26/21 encounter (Appointment) with Emmaline Life, NP.     Allergies:   Guaifenesin & derivatives, Cetirizine hcl, Plavix [clopidogrel bisulfate], and Xyzal [cetirizine hcl]   Social History   Socioeconomic History   Marital status: Widowed    Spouse name: Not on file   Number of children: 3   Years of education: 12th   Highest education level: Not on file  Occupational History   Occupation: clean building    Employer: [employer]  Tobacco Use   Smoking status: Never   Smokeless tobacco: Never  Vaping Use   Vaping Use: Never used  Substance and Sexual Activity   Alcohol use: No   Drug use: No   Sexual activity: Never  Other Topics Concern   Not on file  Social  History Narrative   Lives in [place] with her son.  Her dtr also lives nearby.   Social Determinants of Health   Financial Resource Strain: Not on file  Food Insecurity: Not on file  Transportation Needs: Not on file  Physical Activity: Not on file  Stress: Not on file  Social Connections: Not on file     Family History: The patient's ***family history includes Dementia in her father; Healthy in her brother, brother, brother, sister, sister, and sister; Heart attack in her mother; Hypertension in her mother.  ROS:   Please see the history of present illness.    *** All other systems reviewed and are negative.  Labs/Other Studies Reviewed:    The following studies were reviewed today:  Echo 08/2017  Left ventricle:  The cavity size was normal. There was mild focal  basal hypertrophy of the septum. Systolic function was normal. The  estimated ejection fraction was in the range of 55% to 60%. Wall  motion was normal; there were no regional wall motion  abnormalities. The transmitral flow pattern was normal. The  deceleration time of the early transmitral flow velocity was  normal. The pulmonary vein flow pattern was normal. The tissue  Doppler parameters were normal. Left ventricular diastolic function  parameters were  normal.  Aortic valve:   Structurally normal valve. Trileaflet; normal  thickness leaflets. Cusp separation was normal. Mobility was not  restricted.  Doppler:  Transvalvular velocity was within the normal  range. There was no stenosis. There was no regurgitation.  Aorta: The aorta was normal, not dilated, and non-diseased. Aortic  root: The aortic root was normal in size.  Mitral valve:   Mildly thickened leaflets . Mobility was not  restricted.  Doppler:  Transvalvular velocity was within the normal  range. There was no evidence for stenosis. There was no significant  regurgitation.  Left atrium:  The atrium was normal in size.  Atrial septum:  No defect or  patent foramen ovale was identified.  Right ventricle:  The cavity size was normal. Wall thickness was  normal. Systolic function was normal.  Pulmonic valve:    Doppler:  Transvalvular velocity was within the  normal range. There was no evidence for stenosis. There was mild  regurgitation.  Tricuspid valve:   Structurally normal valve.    Doppler:  Transvalvular velocity was within the normal range. There was mild  regurgitation.  Pulmonary artery:   The main pulmonary artery was normal-sized.  Systolic pressure was within the normal range.  Right atrium:  The atrium was normal in size.  Pericardium: The pericardium was normal in appearance. There was  no pericardial effusion.  Systemic veins:  Inferior vena cava: The vessel was normal in size. The  respirophasic diameter changes were in the normal range (>= 50%),  consistent with normal central venous pressure.   2D Echo 01/2013  - Left ventricle: There is motion at the base. There is    severe hypokinesis in all of the other segments. The    motion is c/w Takotsubo. The cavity size was normal. Wall    thickness was increased in a pattern of moderate LVH.    Systolic function was severely reduced. The estimated    ejection fraction was in the range of 25% to 30%.  - Right ventricle: There is mild/moderate RV dysfunction,    but the RV is not dilated.  - Pulmonary arteries: PA peak pressure: 74m Hg (S).   Recent Labs: No results found for requested labs within last 8760 hours.  Recent Lipid Panel    Component Value Date/Time   CHOL 112 09/05/2020 1526   TRIG 96 09/05/2020 1526   HDL 38 (L) 09/05/2020 1526   CHOLHDL 2.9 09/05/2020 1526   VLDL 12.8 03/15/2013 1025   LDLCALC 56 09/05/2020 1526     Risk Assessment/Calculations:   {Does this patient have ATRIAL FIBRILLATION?:[id number]}       Physical Exam:    VS:  There were no vitals taken for this visit.    Wt Readings from Last 3 Encounters:  10/03/20 106 lb 3.2  oz (48.2 kg)  09/03/20 107 lb 5.8 oz (48.7 kg)  10/05/19 125 lb 9.6 oz (57 kg)     GEN: *** Well nourished, well developed in no acute distress HEENT: Normal NECK: No JVD; No carotid bruits CARDIAC: ***RRR, no murmurs, rubs, gallops RESPIRATORY:  Clear to auscultation without rales, wheezing or rhonchi  ABDOMEN: Soft, non-tender, non-distended MUSCULOSKELETAL:  No edema; No deformity. *** pedal pulses, ***bilaterally SKIN: Warm and dry NEUROLOGIC:  Alert and oriented x 3 PSYCHIATRIC:  Normal affect   EKG:  EKG is *** ordered today.  The ekg ordered today demonstrates ***  Diagnoses:    No diagnosis found. Assessment and Plan:  Cardiomyopathy:  Hypertension:   History of stroke:    {Are you ordering a CV Procedure (e.g. stress test, cath, DCCV, TEE, etc)?   Press F2        :[id number]}    Medication Adjustments/Labs and Tests Ordered: Current medicines are reviewed at length with the patient today.  Concerns regarding medicines are outlined above.  No orders of the defined types were placed in this encounter.  No orders of the defined types were placed in this encounter.   There are no Patient Instructions on file for this visit.   Signed, Emmaline Life, NP  11/24/2021 7:34 PM    [facility]

## 2021-11-26 ENCOUNTER — Ambulatory Visit: Payer: Medicare Other | Admitting: Nurse Practitioner

## 2021-11-28 ENCOUNTER — Encounter: Payer: Self-pay | Admitting: Physician Assistant

## 2021-11-28 ENCOUNTER — Ambulatory Visit (INDEPENDENT_AMBULATORY_CARE_PROVIDER_SITE_OTHER): Payer: Medicare Other | Admitting: Physician Assistant

## 2021-11-28 VITALS — BP 124/70 | HR 67 | Ht 61.0 in | Wt 108.0 lb

## 2021-11-28 DIAGNOSIS — I10 Essential (primary) hypertension: Secondary | ICD-10-CM | POA: Diagnosis not present

## 2021-11-28 DIAGNOSIS — I493 Ventricular premature depolarization: Secondary | ICD-10-CM | POA: Diagnosis not present

## 2021-11-28 DIAGNOSIS — E7800 Pure hypercholesterolemia, unspecified: Secondary | ICD-10-CM

## 2021-11-28 DIAGNOSIS — I5181 Takotsubo syndrome: Secondary | ICD-10-CM | POA: Diagnosis not present

## 2021-11-28 MED ORDER — AMLODIPINE BESYLATE 5 MG PO TABS
ORAL_TABLET | ORAL | 3 refills | Status: DC
Start: 2021-11-28 — End: 2023-04-02

## 2021-11-28 NOTE — Progress Notes (Signed)
?Cardiology Office Note:   ? ?Date:  11/28/2021  ? ?ID:  Deanna Brown, DOB Apr 26, 1946, MRN [id number] ? ?PCP:  Nolene Ebbs, MD  ?[facility] Cardiologist:  Fransico Him, MD  ?[facility] Electrophysiologist:  None  ? ?Chief Complaint: 8-month follow-up ? ?History of Present Illness:   ? ?Deanna MAYWEATHERis a 76y.o. female with a hx of Takotsubo cardiomyopathy with improved LV function, hypertension, PVC, asthma, GERD, and pulmonary hypertension presents for follow-up. ? ?Hx of Takotsubo CM with NSTEMI 2014 with initial EF 25-30% but repeat EF 6 months later by echo was 60% and pulmonary HTN had resolved.   ? ?Last echocardiogram January 2019 showed normal LV function at 55 to 60%, no regional wall motion abnormality. ? ?Patient is here for follow-up.  She denies chest pain, shortness of breath, orthopnea, PND, syncope, dizziness, palpitation or melena.  Compliant with medication.  She walks for 1 hour 4 days a week without any issue.  She only has peripheral vision on left side.  No improvement despite surgery. ? ?Past Medical History:  ?Diagnosis Date  ? Asthma   ? Ejection fraction   ? Environmental allergies   ? GERD (gastroesophageal reflux disease)   ? High cholesterol   ? Hypertension   ? Myocardial infarction ([facility]   ? 01-10-13- [facility],cardiology -yearly visits next 5'2016, denies any further problems  ? [place]   ? a. Mod pulm HTN by cath 01/2013 (PASP 369mg).10-30-14 tolerates walking 30 minutes contiuously x 3-4 times weekly, denies SOB or issues.  ? PVC's (premature ventricular contractions)   ? a. Noted during 01/2013 admission.  ? Seasonal allergies   ? [facility]  ? "Slurred speech, unsteady gait "01-15-2013- no residual effects.  ? Takotsubo cardiomyopathy   ? a. NSTEMI 2/2 takotsubo 01/2013, no significant CAD by cath, EF 25%;  b. 01/2013 Echo: EF 25-30%, mod LVH, Sev HK in all segments except base. Mild/mod RV dysfxn, PASP 3726m.;  c.  Echo 07/2013: normal LVF with EF 60% and  normal PAP  ? [place] ? ? ?Past Surgical History:  ?Procedure Laterality Date  ? ABDOMINAL HYSTERECTOMY  1999  ? BALLOON DILATION N/A 11/30/2014  ? Procedure: BALLOON DILATION;  Surgeon: VinWilford CornerD;  [facility];  Service: Endoscopy;  Laterality: N/A;  ? ESOPHAGOGASTRODUODENOSCOPY (EGD) WITH PROPOFOL N/A 11/01/2014  ? Procedure: ESOPHAGOGASTRODUODENOSCOPY (EGD) WITH PROPOFOL;  Surgeon: JamLaurence SpatesD;  [facility];  Service: Endoscopy;  Laterality: N/A;  ? ESOPHAGOGASTRODUODENOSCOPY (EGD) WITH PROPOFOL N/A 11/30/2014  ? Procedure: ESOPHAGOGASTRODUODENOSCOPY (EGD) WITH PROPOFOL;  Surgeon: VinWilford CornerD;  [facility];  Service: Endoscopy;  Laterality: N/A;  ? ETHMOIDECTOMY Bilateral 09/03/2020  ? Procedure: TOTAL ETHMOIDECTOMY;  Surgeon: TeoLeta BaptistD;  [facility] [place];  Laterality: Bilateral;  ? FRONTAL SINUS EXPLORATION Bilateral 09/03/2020  ? Procedure: FRONTAL RECESS SINUS EXPLORATION;  Surgeon: TeoLeta BaptistD;  [place];  Laterality: Bilateral;  ? LEFT AND RIGHT HEART CATHETERIZATION WITH CORONARY ANGIOGRAM N/A 01/11/2013  ? Procedure: LEFT AND RIGHT HEART CATHETERIZATION WITH CORONARY ANGIOGRAM;  Surgeon: JamMinus BreedingD;  [facility];  Service: Cardiovascular;  Laterality: N/A;  ? MAXILLARY ANTROSTOMY Bilateral 09/03/2020  ? Procedure: MAXILLARY ANTROSTOMY WITH TISSUE REMOVAL;  Surgeon: TeoLeta BaptistD;  [place];  Laterality: Bilateral;  ? SAVORY DILATION N/A 11/01/2014  ? Procedure: SAVORY DILATION;  Surgeon: Laurence Spates, MD;  [facility];  Service: Endoscopy;  Laterality: N/A;  ? SAVORY DILATION N/A 11/30/2014  ? Procedure: SAVORY DILATION;  Surgeon: Wilford Corner, MD;  [facility];  Service: Endoscopy;  Laterality: N/A;  ? SINUS ENDO WITH FUSION Bilateral 09/03/2020  ? Procedure: SINUS ENDOSCOPY WITH  FUSION NAVIGATION;  Surgeon: Leta Baptist, MD;  Location: [place];  Service: ENT;  Laterality: Bilateral;  ? SPHENOIDECTOMY Bilateral 09/03/2020  ? Procedure: SPHENOIDECTOMY WITH TISSUE REMOVAL;  Surgeon: Leta Baptist, MD;  Location: [place];  Service: ENT;  Laterality: Bilateral;  ? TURBINATE REDUCTION Bilateral 09/03/2020  ? Procedure: BILATERAL TURBINATE REDUCTION;  Surgeon: Leta Baptist, MD;  Location: [place];  Service: ENT;  Laterality: Bilateral;  ? ? ?Current Medications: ?Current Meds  ?Medication Sig  ? albuterol (PROVENTIL HFA;VENTOLIN HFA) 108 (90 Base) MCG/ACT inhaler Inhale 2 puffs into the lungs every 6 (six) hours as needed for wheezing or shortness of breath.  ? alendronate (FOSAMAX) 70 MG tablet Take 70 mg by mouth every 7 (seven) days. Take with a full glass of water on an empty stomach.  ? ASPIRIN LOW DOSE 81 MG EC tablet Take 81 mg by mouth daily.  ? atorvastatin (LIPITOR) 40 MG tablet TAKE 1 TABLET BY MOUTH AT 6 PM  ? carvedilol (COREG) 25 MG tablet Take 1 tablet (25 mg total) by mouth 2 (two) times daily with a meal.  ? fluticasone (FLONASE) 50 MCG/ACT nasal spray Place into both nostrils daily.  ? hydrochlorothiazide (HYDRODIURIL) 12.5 MG tablet TAKE 1 TABLET BY MOUTH DAILY AS NEEDED FOR SWELLING  ? latanoprost (XALATAN) 0.005 % ophthalmic solution 1 drop at bedtime.  ? loratadine (CLARITIN) 10 MG tablet Take 10 mg by mouth daily.  ? losartan (COZAAR) 100 MG tablet TAKE 1 TABLET BY MOUTH EVERY DAY  ? montelukast (SINGULAIR) 10 MG tablet Take 10 mg by mouth at bedtime.  ? SYMBICORT 160-4.5 MCG/ACT inhaler INHALE 2 PUFFS TWICE DAILY TO PREVENT COUGH OR WHEEZE. RINSE, GARGLE, AND SPIT AFTER USE.  ? [DISCONTINUED] amLODipine (NORVASC) 5 MG tablet TAKE 1 TABLET BY MOUTH EVERYDAY AT BEDTIME  ? [DISCONTINUED] oseltamivir (TAMIFLU) 75 MG capsule Take 1 capsule (75 mg total) by mouth every 12 (twelve) hours.  ? [DISCONTINUED] potassium chloride (KLOR-CON) 20 MEQ  packet TAKE 1 PACKET (20 MEQ) BY MOUTH EVERY DAY  ?  ? ?Allergies:   Guaifenesin & derivatives, Cetirizine hcl, Plavix [clopidogrel bisulfate], and Xyzal [cetirizine hcl]  ? ?Social History  ? ?Socioeconomic History  ? Marital status: Widowed  ?  Spouse name: Not on file  ? Number of children: 3  ? Years of education: 12th  ? Highest education level: Not on file  ?Occupational History  ? Occupation: clean building  ?  Employer: [employer]  ?Tobacco Use  ? Smoking status: Never  ? Smokeless tobacco: Never  ?Vaping Use  ? Vaping Use: Never used  ?Substance and Sexual Activity  ? Alcohol use: No  ? Drug use: No  ? Sexual activity: Never  ?Other Topics Concern  ? Not on file  ?Social History Narrative  ? Lives in [place] with her son.  Her dtr also lives nearby.  ? ?Social Determinants of Health  ? ?Financial Resource Strain: Not on file  ?Food Insecurity: Not on file  ?Transportation Needs: Not on file  ?Physical Activity: Not on file  ?Stress: Not on file  ?Social Connections: Not on file  ?  ? ?  Family History: ?The patient's family history includes Dementia in her father; Healthy in her brother, brother, brother, sister, sister, and sister; Heart attack in her mother; Hypertension in her mother.   ? ?ROS:   ?Please see the history of present illness.    ?All other systems reviewed and are negative.  ? ?EKGs/Labs/Other Studies Reviewed:   ? ?The following studies were reviewed today: ? ?Echo 08/2017 ?Study Conclusions  ? ?- Left ventricle: The cavity size was normal. There was mild focal  ?  basal hypertrophy of the septum. Systolic function was normal.  ?  The estimated ejection fraction was in the range of 55% to 60%.  ?  Wall motion was normal; there were no regional wall motion  ?  abnormalities. Left ventricular diastolic function parameters  ?  were normal.  ?- Atrial septum: No defect or patent foramen ovale was identified. ? ?EKG:  EKG is  ordered today.  The ekg ordered today demonstrates sinus rhythm at rate of  67 bpm ? ?Recent Labs: ?No results found for requested labs within last 8760 hours.  ?Recent Lipid Panel ?   ?Component Value Date/Time  ? CHOL 112 09/05/2020 1526  ? TRIG 96 09/05/2020 1526  ? HDL

## 2021-11-28 NOTE — Patient Instructions (Signed)
Medication Instructions:  ?Your physician recommends that you continue on your current medications as directed. Please refer to the Current Medication list given to you today. ?*If you need a refill on your cardiac medications before your next appointment, please call your pharmacy* ? ?Lab Work: ?NONE ? ?Testing/Procedures: ?NONE ? ?Follow-Up: ?At [facility], you and your health needs are our priority.  As part of our continuing mission to provide you with exceptional heart care, we have created designated Provider Care Teams.  These Care Teams include your primary Cardiologist (physician) and Advanced Practice Providers (APPs -  Physician Assistants and Nurse Practitioners) who all work together to provide you with the care you need, when you need it. ? ?We recommend signing up for the patient portal called "MyChart".  Sign up information is provided on this After Visit Summary.  MyChart is used to connect with patients for Virtual Visits (Telemedicine).  Patients are able to view lab/test results, encounter notes, upcoming appointments, etc.  Non-urgent messages can be sent to your provider as well.   ?To learn more about what you can do with MyChart, go to [email or web address].   ? ?Your next appointment:   ?12 month(s) ? ?The format for your next appointment:   ?In Person ? ?Provider:   ?Fransico Him, MD { ? ? ?Important Information About Sugar ? ? ? ? ?  ?

## 2022-01-30 ENCOUNTER — Other Ambulatory Visit: Payer: Self-pay | Admitting: Cardiology

## 2022-09-09 ENCOUNTER — Other Ambulatory Visit: Payer: Self-pay | Admitting: Internal Medicine

## 2022-09-10 LAB — LIPID PANEL
Cholesterol: 113 mg/dL (ref <200)
HDL: 45 mg/dL — ABNORMAL LOW (ref >=50)
LDL Cholesterol (Calc): 54 mg/dL (calc)
Non-HDL Cholesterol (Calc): 68 mg/dL (calc) (ref <130)
Total CHOL/HDL Ratio: 2.5 (calc) (ref <5.0)
Triglycerides: 59 mg/dL (ref <150)

## 2022-09-10 LAB — EXTRA LAV TOP TUBE

## 2022-11-19 ENCOUNTER — Other Ambulatory Visit: Payer: Self-pay | Admitting: Cardiology

## 2023-02-19 ENCOUNTER — Other Ambulatory Visit: Payer: Self-pay | Admitting: Cardiology

## 2023-03-10 ENCOUNTER — Other Ambulatory Visit: Payer: Self-pay | Admitting: Internal Medicine

## 2023-04-02 ENCOUNTER — Ambulatory Visit: Payer: Medicare Other | Attending: Cardiology | Admitting: Cardiology

## 2023-04-02 ENCOUNTER — Encounter: Payer: Self-pay | Admitting: Cardiology

## 2023-04-02 VITALS — BP 146/86 | HR 57 | Ht 61.0 in | Wt 118.0 lb

## 2023-04-02 DIAGNOSIS — I10 Essential (primary) hypertension: Secondary | ICD-10-CM | POA: Diagnosis not present

## 2023-04-02 DIAGNOSIS — E7800 Pure hypercholesterolemia, unspecified: Secondary | ICD-10-CM | POA: Diagnosis not present

## 2023-04-02 DIAGNOSIS — I5181 Takotsubo syndrome: Secondary | ICD-10-CM

## 2023-04-02 MED ORDER — CARVEDILOL 25 MG PO TABS
25.0000 mg | ORAL_TABLET | Freq: Two times a day (BID) | ORAL | 11 refills | Status: DC
Start: 2023-04-02 — End: 2024-01-01

## 2023-04-02 MED ORDER — AMLODIPINE BESYLATE 2.5 MG PO TABS: 2.5000 mg | ORAL_TABLET | Freq: Every day | ORAL | 3 refills | Status: AC

## 2023-04-02 NOTE — Progress Notes (Signed)
Cardiology Office Note:    Date:  04/02/2023   ID:  [name], DOB 1945/12/08, MRN [id number]  PCP:  Fleet Contras, MD  [facility] Cardiologist:  Armanda Magic, MD  [facility] Electrophysiologist:  None   Chief Complaint: 65-months follow-up  History of Present Illness:    Deanna Brown is a 77 y.o. female with a hx of Takotsubo cardiomyopathy with initial EF 25 to 30% and pulmonary hypertension but then repeat echo 6 months later showed an EF of 60% and pulmonary hypertension have resolved.  She also has a history of hypertension, PVC, asthma, GERD.  She is here today for followup and is doing well.  She denies any chest pain or pressure, SOB, DOE, PND, orthopnea, LE edema, dizziness, palpitations or syncope. She is compliant with her meds and is tolerating meds with no SE.    Past Medical History:  Diagnosis Date   Asthma    Ejection fraction    Environmental allergies    GERD (gastroesophageal reflux disease)    High cholesterol    Hypertension    Myocardial infarction [facility])    01-10-13- [facility],cardiology -yearly visits next 5'2016, denies any further problems   Pulmonary HTN (HCC)    a. Mod pulm HTN by cath 01/2013 (PASP ).10-30-14 tolerates walking 30 minutes contiuously x 3-4 times weekly, denies SOB or issues.   PVC's (premature ventricular contractions)    a. Noted during 01/2013 admission.   Seasonal allergies    Stroke [facility])    "Slurred speech, unsteady gait "01-15-2013- no residual effects.   Takotsubo cardiomyopathy    a. NSTEMI 2/2 takotsubo 01/2013, no significant CAD by cath, EF 25%;  b. 01/2013 Echo: EF 25-30%, mod LVH, Sev HK in all segments except base. Mild/mod RV dysfxn, PASP .;  c.  Echo 07/2013: normal LVF with EF 60% and normal PAP   Uterine cancer (HCC)     Past Surgical History:  Procedure Laterality Date   ABDOMINAL HYSTERECTOMY  1999   BALLOON DILATION N/A 11/30/2014   Procedure: BALLOON DILATION;  Surgeon: Charlott Rakes,  MD;  [facility];  Service: Endoscopy;  Laterality: N/A;   ESOPHAGOGASTRODUODENOSCOPY (EGD) WITH PROPOFOL N/A 11/01/2014   Procedure: ESOPHAGOGASTRODUODENOSCOPY (EGD) WITH PROPOFOL;  Surgeon: Carman Ching, MD;  [facility];  Service: Endoscopy;  Laterality: N/A;   ESOPHAGOGASTRODUODENOSCOPY (EGD) WITH PROPOFOL N/A 11/30/2014   Procedure: ESOPHAGOGASTRODUODENOSCOPY (EGD) WITH PROPOFOL;  Surgeon: Charlott Rakes, MD;  Location: [facility] END[facility]COPY;  Service: Endoscopy;  Laterality: N/A;   ETHMOIDECTOMY Bilateral 09/03/2020   Procedure: TOTAL ETHMOIDECTOMY;  Surgeon: Newman Pies, MD;  Location: [facility];  Service: ENT;  Laterality: Bilateral;   FRONTAL SINUS EXPLORATION Bilateral 09/03/2020   Procedure: FRONTAL RECESS SINUS EXPLORATION;  Surgeon: Newman Pies, MD;  Location: [facility];  Service: ENT;  Laterality: Bilateral;   LEFT AND RIGHT HEART CATHETERIZATION WITH CORONARY ANGIOGRAM N/A 01/11/2013   Procedure: LEFT AND RIGHT HEART CATHETERIZATION WITH CORONARY ANGIOGRAM;  Surgeon: Rollene Rotunda, MD;  [facility];  Service: Cardiovascular;  Laterality: N/A;   MAXILLARY ANTROSTOMY Bilateral 09/03/2020   Procedure: MAXILLARY ANTROSTOMY WITH TISSUE REMOVAL;  Surgeon: Newman Pies, MD;  Location: [facility];  Service: ENT;  Laterality: Bilateral;   SAVORY DILATION N/A 11/01/2014   Procedure: SAVORY DILATION;  Surgeon: Carman Ching, MD;  [facility];  Service: Endoscopy;  Laterality: N/A;   SAVORY DILATION N/A 11/30/2014   Procedure: SAVORY DILATION;  Surgeon: Charlott Rakes, MD;  [facility];  Service: Endoscopy;  Laterality: N/A;   SINUS ENDO WITH FUSION Bilateral 09/03/2020   Procedure: SINUS ENDOSCOPY WITH FUSION NAVIGATION;  Surgeon: Newman Pies, MD;  Location: [facility];  Service: ENT;  Laterality: Bilateral;   SPHENOIDECTOMY Bilateral 09/03/2020   Procedure: SPHENOIDECTOMY WITH TISSUE REMOVAL;  Surgeon:  Newman Pies, MD;  Location: [facility];  Service: ENT;  Laterality: Bilateral;   TURBINATE REDUCTION Bilateral 09/03/2020   Procedure: BILATERAL TURBINATE REDUCTION;  Surgeon: Newman Pies, MD;  Location: [facility];  Service: ENT;  Laterality: Bilateral;    Current Medications: Current Meds  Medication Sig   albuterol (PROVENTIL HFA;VENTOLIN HFA) 108 (90 Base) MCG/ACT inhaler Inhale 2 puffs into the lungs every 6 (six) hours as needed for wheezing or shortness of breath.   alendronate (FOSAMAX) 70 MG tablet Take 70 mg by mouth every 7 (seven) days. Take with a full glass of water on an empty stomach.   ASPIRIN LOW DOSE 81 MG EC tablet Take 81 mg by mouth daily.   atorvastatin (LIPITOR) 40 MG tablet TAKE 1 TABLET BY MOUTH DAILY AT 6PM   carboxymethylcellulose (REFRESH PLUS) 0.5 % SOLN 1 drop 3 (three) times daily as needed.   carvedilol (COREG) 25 MG tablet Take 1 tablet (25 mg total) by mouth 2 (two) times daily with a meal.   dorzolamide (TRUSOPT) 2 % ophthalmic solution Place 1 drop into the right eye 2 (two) times daily.   fluticasone (FLONASE) 50 MCG/ACT nasal spray Place into both nostrils daily.   latanoprost (XALATAN) 0.005 % ophthalmic solution 1 drop at bedtime.   loratadine (CLARITIN) 10 MG tablet Take 10 mg by mouth daily.   losartan (COZAAR) 100 MG tablet TAKE 1 TABLET BY MOUTH EVERY DAY   montelukast (SINGULAIR) 10 MG tablet Take 10 mg by mouth at bedtime.   predniSONE (DELTASONE) 5 MG tablet Take 5 mg by mouth daily as needed.   SYMBICORT 160-4.5 MCG/ACT inhaler INHALE 2 PUFFS TWICE DAILY TO PREVENT COUGH OR WHEEZE. RINSE, GARGLE, AND SPIT AFTER USE.     Allergies:   Guaifenesin & derivatives, Cetirizine hcl, Plavix [clopidogrel bisulfate], and Xyzal [cetirizine hcl]   Social History   Socioeconomic History   Marital status: Widowed    Spouse name: Not on file   Number of children: 3   Years of education: 12th   Highest education level: Not on  file  Occupational History   Occupation: clean building    Employer: [employer]  Tobacco Use   Smoking status: Never   Smokeless tobacco: Never  Vaping Use   Vaping status: Never Used  Substance and Sexual Activity   Alcohol use: No   Drug use: No   Sexual activity: Never  Other Topics Concern   Not on file  Social History Narrative   Lives in [place] with her son.  Her dtr also lives nearby.   Social Determinants of Health   Financial Resource Strain: Not on file  Food Insecurity: Not on file  Transportation Needs: Not on file  Physical Activity: Not on file  Stress: Not on file  Social Connections: Not on file     Family History: The patient's family history includes Dementia in her father; Healthy in her brother, brother, brother, sister, sister, and sister; Heart attack in her mother; Hypertension in her mother.    ROS:   Please see the history of present illness.    All other systems reviewed and are  negative.   EKGs/Labs/Other Studies Reviewed:    The following studies were reviewed today:  EKG Interpretation Date/Time:  Thursday April 02 2023 08:39:30 EDT Ventricular Rate:  57 PR Interval:  180 QRS Duration:  104 QT Interval:  406 QTC Calculation: 395 R Axis:   66  Text Interpretation: Sinus bradycardia When compared with ECG of 16-Mar-2016 10:28, PREVIOUS ECG IS PRESENT Confirmed by Armanda Magic ([id number]) on 04/02/2023 8:41:48 AM    Echo 08/2017 Study Conclusions   - Left ventricle: The cavity size was normal. There was mild focal    basal hypertrophy of the septum. Systolic function was normal.    The estimated ejection fraction was in the range of 55% to 60%.    Wall motion was normal; there were no regional wall motion    abnormalities. Left ventricular diastolic function parameters    were normal.  - Atrial septum: No defect or patent foramen ovale was identified.   Recent Labs: 03/10/2023: ALT 9; BUN 19; Creat 1.25; Hemoglobin 11.0; Platelets 260;  Potassium 4.1; Sodium 140; TSH 2.11  Recent Lipid Panel    Component Value Date/Time   CHOL 113 09/09/2022 0000   TRIG 59 09/09/2022 0000   HDL 45 (L) 09/09/2022 0000   CHOLHDL 2.5 09/09/2022 0000   VLDL 12.8 03/15/2013 1025   LDLCALC 54 09/09/2022 0000    Physical Exam:    VS:  BP (!) 146/86 (BP Location: Left Arm, Patient Position: Sitting, Cuff Size: Normal)   Pulse (!) 57   Ht 5\' 1"  (1.549 m)   Wt 118 lb (53.5 kg)   SpO2 98%   BMI 22.30 kg/m     Wt Readings from Last 3 Encounters:  04/02/23 118 lb (53.5 kg)  11/28/21 108 lb (49 kg)  10/03/20 106 lb 3.2 oz (48.2 kg)    GEN: Well nourished, well developed in no acute distress HEENT: Normal NECK: No JVD; No carotid bruits LYMPHATICS: No lymphadenopathy CARDIAC:RRR, no murmurs, rubs, gallops RESPIRATORY:  Clear to auscultation without rales, wheezing or rhonchi  ABDOMEN: Soft, non-tender, non-distended MUSCULOSKELETAL:  No edema; No deformity  SKIN: Warm and dry NEUROLOGIC:  Alert and oriented x 3 PSYCHIATRIC:  Normal affect  ASSESSMENT AND PLAN:    NICM -Hx of Takotsubo cardiomyopathy with normalization of LV function -Echo 2019 with stable LV function at 55 to 60%.  -She does not appear volume overloaded on exam today -Continue prescription drug management with carvedilol 25 mg twice daily and losartan 100 mg daily with as needed refills -I will repeat her echo since her last 1 was 5 years ago to make sure LVF still normal  2.  Hypertension -BP mildly elevated this am >>her PCP took her off Amlodipine a month ago (due to low BP on 5mg  daily) and her BP has been slowly increasing -Continue prescription drug management with carvedilol 25 mg twice daily, losartan 100 mg daily with as needed refills -restart Amlodipine at a lower dose of 2.5mg  daily and check BP twice daily for a week and call with results -I have personally reviewed and interpreted outside labs performed by patient's PCP which showed serum  creatinine 1.25 and potassium 4.1 on 03/10/2023  3.  Hyperlipidemia -LDL goal less than 100  -I have personally reviewed and interpreted outside labs performed by patient's PCP which showed LDL 54 and HDL 45 on 09/09/2022  -Continue prescription drug management with atorvastatin 40 mg daily with as needed refills  Medication Adjustments/Labs and Tests Ordered: Current medicines are  reviewed at length with the patient today.  Concerns regarding medicines are outlined above.  Orders Placed This Encounter  Procedures   EKG 12-Lead   No orders of the defined types were placed in this encounter.   There are no Patient Instructions on file for this visit.   Signed, Armanda Magic, MD  04/02/2023 8:48 AM    [facility]

## 2023-04-02 NOTE — Addendum Note (Signed)
Addended by: Luellen Pucker on: 04/02/2023 08:58 AM   Modules accepted: Orders

## 2023-04-02 NOTE — Patient Instructions (Signed)
Medication Instructions:  Please START taking 2.5 mg amlodipine daily.   *If you need a refill on your cardiac medications before your next appointment, please call your pharmacy*   Lab Work: None.  If you have labs (blood work) drawn today and your tests are completely normal, you will receive your results only by: MyChart Message (if you have MyChart) OR A paper copy in the mail If you have any lab test that is abnormal or we need to change your treatment, we will call you to review the results.   Testing/Procedures: Your physician has requested that you have an echocardiogram. Echocardiography is a painless test that uses sound waves to create images of your heart. It provides your doctor with information about the size and shape of your heart and how well your heart's chambers and valves are working. This procedure takes approximately one hour. There are no restrictions for this procedure. Please do NOT wear cologne, perfume, aftershave, or lotions (deodorant is allowed). Please arrive 15 minutes prior to your appointment time.    Follow-Up: At [facility], you and your health needs are our priority.  As part of our continuing mission to provide you with exceptional heart care, we have created designated Provider Care Teams.  These Care Teams include your primary Cardiologist (physician) and Advanced Practice Providers (APPs -  Physician Assistants and Nurse Practitioners) who all work together to provide you with the care you need, when you need it.  We recommend signing up for the patient portal called "MyChart".  Sign up information is provided on this After Visit Summary.  MyChart is used to connect with patients for Virtual Visits (Telemedicine).  Patients are able to view lab/test results, encounter notes, upcoming appointments, etc.  Non-urgent messages can be sent to your provider as well.   To learn more about what you can do with MyChart, go to  [email or web address].    Your next appointment:   1 year(s)  Provider:   Armanda Magic, MD     Other Instructions Please start checking your blood pressure twice a day, preferable 2-3 hours after you have taken any blood pressure medications. For many patients, it works to check your blood pressure at lunch and at dinner. Write down your readings every day for a week and then call our office to submit.

## 2023-04-09 ENCOUNTER — Telehealth: Payer: Self-pay | Admitting: Cardiology

## 2023-04-09 NOTE — Telephone Encounter (Signed)
Jonas, Scarpitti - 04/09/2023  1:56 PM Quintella Reichert, MD  Sent: Thu April 09, 2023  3:39 PM  To: Loa Socks, LPN  Cc: Luellen Pucker, RN         Message  For the most part BPs are good     Pt aware that per Dr. Mayford Knife, for the most part her BP's are good. Advised the pt to continue her current treatment plan.  Pt verbalized understanding and agrees with this plan.

## 2023-04-09 NOTE — Telephone Encounter (Signed)
Patient called to report her blood pressure readings as requested by Dr. Mayford Knife.  8/30  157/73  HR 75  2:25 pm 8/31  140/70  HR 68  12:25 pm          144/71  HR 85   4:52 pm 9/2   120/65  HR 68  11:00 am          134/77  HR 76  6:40 pm 9/3    131/75  HR 67  12:03 pm          140/65  HR 64  6:00 pm 9/4    132/72  HR 65  11:55 am          138/74  HR 79  4:13 pm 9/5    131/72  HR 70  12:03 pm  Patient noted she will be out of town today after 5:00 pm until Saturday.

## 2023-04-28 ENCOUNTER — Ambulatory Visit (HOSPITAL_COMMUNITY): Payer: Medicare Other | Attending: Internal Medicine

## 2023-04-28 DIAGNOSIS — I5181 Takotsubo syndrome: Secondary | ICD-10-CM | POA: Diagnosis present

## 2023-04-28 LAB — ECHOCARDIOGRAM COMPLETE
Area-P 1/2: 3.6 cm2
S' Lateral: 2 cm

## 2023-04-30 ENCOUNTER — Telehealth: Payer: Self-pay

## 2023-04-30 NOTE — Telephone Encounter (Signed)
Call to patient to advise Echo showed normal pumping function with moderately thickened heart muscle, mildly leaky mitral valve. There is a large hiatal hernia (noted on xray in the past) that abuts up against the left atrial wall. Patient states this is news to her, but she has a f/u with PCP in early November and will discuss it with him. I advised I would forward to her PCP for her, patient verbalizes understanding.

## 2023-04-30 NOTE — Telephone Encounter (Signed)
-----   Message from Armanda Magic sent at 04/28/2023  5:52 PM EDT ----- Echo showed normal pumping function with moderately thickened heart muscle, mildly leaky mitral valve.  There is a large hiatal hernia (noted on xray in the past) that abuts up against the left atrial wall.  Needs followup with PCP regarding hiatal hernia

## 2023-05-20 ENCOUNTER — Other Ambulatory Visit: Payer: Self-pay | Admitting: Cardiology

## 2023-09-28 ENCOUNTER — Encounter: Payer: Self-pay | Admitting: Emergency Medicine

## 2023-09-28 ENCOUNTER — Ambulatory Visit
Admission: EM | Admit: 2023-09-28 | Discharge: 2023-09-28 | Disposition: A | Discharge status: Home / Self Care | Payer: Medicare Other

## 2023-09-28 DIAGNOSIS — M62838 Other muscle spasm: Secondary | ICD-10-CM | POA: Diagnosis not present

## 2023-09-28 NOTE — ED Triage Notes (Signed)
 Pt c/o neck pain for 3 days. Denies any injury. States probably from how she sleeps

## 2023-09-28 NOTE — Discharge Instructions (Signed)
 Your pain is likely due to a muscle strain which will improve on its own with time.   - You may take over the counter tylenol 1,000mg  every 6 hours as needed for pain. - Apply heat 20 minutes on then 20 minutes off and perform gentle range of motion exercises to the area of greatest pain to prevent muscle stiffness and provide further pain relief.   Red flag symptoms to watch out for are numbness/tingling to the legs, weakness, loss of bowel/bladder control, and/or worsening pain that does not respond well to medicines. Follow-up with your primary care provider or return to urgent care if your symptoms do not improve in the next 3 to 4 days with medications and interventions recommended today. If your symptoms are severe (red flag), please go to the emergency room.

## 2023-09-28 NOTE — ED Provider Notes (Addendum)
 [facility]    CSN: [id number] Arrival date & time: 09/28/23  1044      History   Chief Complaint Chief Complaint  Patient presents with   Neck Pain    HPI Deanna Brown is a 78 y.o. female.   Patient presents to urgent care for evaluation of pain to the right neck that started approximately 3 days ago. Pain is localized to the right posterior neck and does not radiate to the arms, spine, or head. Neck pain is triggered by movement and improves with rest and application of heat to the neck. Patient suspects she "slept wrong" and this may have caused pain. Denies recent injuries to neck, fever, chills, cough, congestion, extremity weakness, rash, or history of spinal surgeries. Heat helps relieve pain. She has not attempted use of tylenol or any other OTC medications for pain PTA.    Neck Pain   Past Medical History:  Diagnosis Date   Asthma    Ejection fraction    Environmental allergies    GERD (gastroesophageal reflux disease)    High cholesterol    Hypertension    Myocardial infarction [facility])    01-10-13- [facility],cardiology -yearly visits next 5'2016, denies any further problems   Pulmonary HTN (HCC)    a. Mod pulm HTN by cath 01/2013 (PASP ).10-30-14 tolerates walking 30 minutes contiuously x 3-4 times weekly, denies SOB or issues.   PVC's (premature ventricular contractions)    a. Noted during 01/2013 admission.   Seasonal allergies    Stroke [facility])    "Slurred speech, unsteady gait "01-15-2013- no residual effects.   Takotsubo cardiomyopathy    a. NSTEMI 2/2 takotsubo 01/2013, no significant CAD by cath, EF 25%;  b. 01/2013 Echo: EF 25-30%, mod LVH, Sev HK in all segments except base. Mild/mod RV dysfxn, PASP .;  c.  Echo 07/2013: normal LVF with EF 60% and normal PAP   Uterine cancer [facility])     Patient Active Problem List   Diagnosis Date Noted   Edema of extremities 08/20/2017   Asthma exacerbation 10/30/2015   Acute respiratory failure with  hypoxia (HCC) 10/30/2015   Acute asthma exacerbation 10/30/2015   Neck pain on left side 03/23/2015   Stricture and stenosis of esophagus 11/30/2014   Difficulty in swallowing 11/30/2014   Ejection fraction    Aortic valve sclerosis 07/12/2013   History of CVA (cerebrovascular accident) 06/13/2013   PVC's (premature ventricular contractions)    Uterine cancer (HCC)    High cholesterol    Hypertension    Takotsubo cardiomyopathy (history of 2014)    GERD (gastroesophageal reflux disease) 01/15/2013    Past Surgical History:  Procedure Laterality Date   ABDOMINAL HYSTERECTOMY  1999   BALLOON DILATION N/A 11/30/2014   Procedure: BALLOON DILATION;  Surgeon: Charlott Rakes, MD;  Location: [facility] ENDOSCOPY;  Service: Endoscopy;  Laterality: N/A;   ESOPHAGOGASTRODUODENOSCOPY (EGD) WITH PROPOFOL N/A 11/01/2014   Procedure: ESOPHAGOGASTRODUODENOSCOPY (EGD) WITH PROPOFOL;  Surgeon: Carman Ching, MD;  Location: [facility] ENDOSCOPY;  Service: Endoscopy;  Laterality: N/A;   ESOPHAGOGASTRODUODENOSCOPY (EGD) WITH PROPOFOL N/A 11/30/2014   Procedure: ESOPHAGOGASTRODUODENOSCOPY (EGD) WITH PROPOFOL;  Surgeon: Charlott Rakes, MD;  Location: [facility] ENDOSCOPY;  Service: Endoscopy;  Laterality: N/A;   ETHMOIDECTOMY Bilateral 09/03/2020   Procedure: TOTAL ETHMOIDECTOMY;  Surgeon: Newman Pies, MD;  Location: [facility];  Service: ENT;  Laterality: Bilateral;   FRONTAL SINUS EXPLORATION Bilateral 09/03/2020   Procedure: FRONTAL RECESS SINUS EXPLORATION;  Surgeon: Newman Pies, MD;  Location:  [facility];  Service: ENT;  Laterality: Bilateral;   LEFT AND RIGHT HEART CATHETERIZATION WITH CORONARY ANGIOGRAM N/A 01/11/2013   Procedure: LEFT AND RIGHT HEART CATHETERIZATION WITH CORONARY ANGIOGRAM;  Surgeon: Rollene Rotunda, MD;  Location: [facility] CATH LAB;  Service: Cardiovascular;  Laterality: N/A;   MAXILLARY ANTROSTOMY Bilateral 09/03/2020   Procedure: MAXILLARY ANTROSTOMY WITH TISSUE REMOVAL;  Surgeon: Newman Pies, MD;  Location: [facility];  Service: ENT;  Laterality: Bilateral;   SAVORY DILATION N/A 11/01/2014   Procedure: SAVORY DILATION;  Surgeon: Carman Ching, MD;  Location: [facility] ENDOSCOPY;  Service: Endoscopy;  Laterality: N/A;   SAVORY DILATION N/A 11/30/2014   Procedure: SAVORY DILATION;  Surgeon: Charlott Rakes, MD;  Location: [facility] ENDOSCOPY;  Service: Endoscopy;  Laterality: N/A;   SINUS ENDO WITH FUSION Bilateral 09/03/2020   Procedure: SINUS ENDOSCOPY WITH FUSION NAVIGATION;  Surgeon: Newman Pies, MD;  Location: [facility];  Service: ENT;  Laterality: Bilateral;   SPHENOIDECTOMY Bilateral 09/03/2020   Procedure: SPHENOIDECTOMY WITH TISSUE REMOVAL;  Surgeon: Newman Pies, MD;  Location: [facility];  Service: ENT;  Laterality: Bilateral;   TURBINATE REDUCTION Bilateral 09/03/2020   Procedure: BILATERAL TURBINATE REDUCTION;  Surgeon: Newman Pies, MD;  Location: [facility];  Service: ENT;  Laterality: Bilateral;    OB History   No obstetric history on file.      Home Medications    Prior to Admission medications   Medication Sig Start Date End Date Taking? Authorizing Provider  albuterol (PROVENTIL HFA;VENTOLIN HFA) 108 (90 Base) MCG/ACT inhaler Inhale 2 puffs into the lungs every 6 (six) hours as needed for wheezing or shortness of breath.    [provider]  alendronate (FOSAMAX) 70 MG tablet Take 70 mg by mouth every 7 (seven) days. Take with a full glass of water on an empty stomach.    [provider]  amLODipine (NORVASC) 2.5 MG tablet Take 1 tablet (2.5 mg total) by mouth daily. 04/02/23 07/01/23  Quintella Reichert, MD  ASPIRIN LOW DOSE 81 MG EC tablet Take 81 mg by mouth daily. 09/14/20   [provider]  atorvastatin (LIPITOR) 40 MG tablet TAKE 1 TABLET BY MOUTH EVERY DAY AT 6 PM 05/20/23   Turner, Cornelious Bryant, MD  carboxymethylcellulose (REFRESH PLUS) 0.5 % SOLN 1 drop 3 (three) times daily as needed.    [provider]  carvedilol (COREG) 25 MG tablet Take 1 tablet (25 mg total) by mouth 2 (two) times daily with a meal. 04/02/23   Turner, Cornelious Bryant, MD  dorzolamide (TRUSOPT) 2 % ophthalmic solution Place 1 drop into the right eye 2 (two) times daily.    [provider]  fluticasone (FLONASE) 50 MCG/ACT nasal spray Place into both nostrils daily.    [provider]  hydrochlorothiazide (HYDRODIURIL) 12.5 MG tablet TAKE 1 TABLET BY MOUTH DAILY AS NEEDED FOR SWELLING Patient not taking: Reported on 04/02/2023 11/27/20   Quintella Reichert, MD  latanoprost (XALATAN) 0.005 % ophthalmic solution 1 drop at bedtime. 09/20/19   [provider]  loratadine (CLARITIN) 10 MG tablet Take 10 mg by mouth daily.    [provider]  losartan (COZAAR) 100 MG tablet TAKE 1 TABLET BY MOUTH EVERY DAY 05/20/23   Turner, Cornelious Bryant, MD  montelukast (SINGULAIR) 10 MG tablet Take 10 mg by mouth at bedtime.    [provider]  predniSONE (DELTASONE) 5 MG tablet Take 5 mg by mouth daily as  needed. Patient not taking: Reported on 09/28/2023 03/09/23   [provider]  SYMBICORT 160-4.5 MCG/ACT inhaler INHALE 2 PUFFS TWICE DAILY TO PREVENT COUGH OR WHEEZE. RINSE, GARGLE, AND SPIT AFTER USE. 07/31/15   Fletcher Anon, MD    Family History Family History  Problem Relation Age of Onset   Heart attack Mother    Hypertension Mother    Dementia Father    Healthy Sister    Healthy Brother    Healthy Sister    Healthy Sister    Healthy Brother    Healthy Brother     Social History Social History   Tobacco Use   Smoking status: Never   Smokeless tobacco: Never  Vaping Use   Vaping status: Never Used  Substance Use Topics   Alcohol use: No   Drug use: No     Allergies   Guaifenesin & derivatives, Cetirizine hcl, Plavix [clopidogrel bisulfate], and Xyzal [cetirizine hcl]   Review of Systems Review of Systems  Musculoskeletal:  Positive for neck pain.  Per  HPI   Physical Exam Triage Vital Signs ED Triage Vitals  Encounter Vitals Group     BP 09/28/23 1102 (!) 102/56     Systolic BP Percentile --      Diastolic BP Percentile --      Pulse Rate 09/28/23 1102 77     Resp 09/28/23 1102 17     Temp 09/28/23 1102 98.2 F (36.8 C)     Temp Source 09/28/23 1102 Oral     SpO2 09/28/23 1102 95 %     Weight --      Height --      Head Circumference --      Peak Flow --      Pain Score 09/28/23 1105 5     Pain Loc --      Pain Education --      Exclude from Growth Chart --    No data found.  Updated Vital Signs BP (!) 102/56 (BP Location: Right Arm)   Pulse 77   Temp 98.2 F (36.8 C) (Oral)   Resp 17   SpO2 95%   Visual Acuity Right Eye Distance:   Left Eye Distance:   Bilateral Distance:    Right Eye Near:   Left Eye Near:    Bilateral Near:     Physical Exam Vitals and nursing note reviewed.  Constitutional:      Appearance: She is not ill-appearing or toxic-appearing.  HENT:     Head: Normocephalic and atraumatic.     Right Ear: Hearing, tympanic membrane, ear canal and external ear normal.     Left Ear: Hearing, tympanic membrane, ear canal and external ear normal.     Nose: Nose normal.     Mouth/Throat:     Lips: Pink.     Mouth: Mucous membranes are moist. No injury or oral lesions.     Dentition: Normal dentition.     Tongue: No lesions.     Pharynx: Oropharynx is clear. Uvula midline. No pharyngeal swelling, oropharyngeal exudate, posterior oropharyngeal erythema, uvula swelling or postnasal drip.     Tonsils: No tonsillar exudate.  Eyes:     General: Lids are normal. Vision grossly intact. Gaze aligned appropriately.     Extraocular Movements: Extraocular movements intact.     Conjunctiva/sclera: Conjunctivae normal.  Neck:     Trachea: Trachea and phonation normal.     Comments: Normal ROM of neck.  Cardiovascular:  Rate and Rhythm: Normal rate and regular rhythm.     Heart sounds: Normal heart  sounds, S1 normal and S2 normal.  Pulmonary:     Effort: Pulmonary effort is normal. No respiratory distress.     Breath sounds: Normal breath sounds and air entry.  Musculoskeletal:     Cervical back: Normal range of motion and neck supple. Pain with movement and muscular tenderness (Tender to mulitple points of palpation over the right trapezius muscle and right cervical paraspinous muscles.) present. No spinous process tenderness.  Lymphadenopathy:     Cervical: No cervical adenopathy.  Skin:    General: Skin is warm and dry.     Capillary Refill: Capillary refill takes less than 2 seconds.     Findings: No rash.  Neurological:     General: No focal deficit present.     Mental Status: She is alert and oriented to person, place, and time. Mental status is at baseline.     GCS: GCS eye subscore is 4. GCS verbal subscore is 5. GCS motor subscore is 6.     Cranial Nerves: Cranial nerves 2-12 are intact. No dysarthria or facial asymmetry.     Sensory: Sensation is intact.     Motor: Motor function is intact. No weakness, tremor, abnormal muscle tone or pronator drift.     Coordination: Coordination is intact. Romberg sign negative. Coordination normal. Finger-Nose-Finger Test normal.     Gait: Gait is intact.     Comments: Strength and sensation intact to bilateral upper and lower extremities (5/5). Moves all 4 extremities with normal coordination voluntarily. Non-focal neuro exam.   Psychiatric:        Mood and Affect: Mood normal.        Speech: Speech normal.        Behavior: Behavior normal.        Thought Content: Thought content normal.        Judgment: Judgment normal.      UC Treatments / Results  Labs (all labs ordered are listed, but only abnormal results are displayed) Labs Reviewed - No data to display  EKG   Radiology No results found.  Procedures Procedures (including critical care time)  Medications Ordered in UC Medications - No data to display  Initial  Impression / Assessment and Plan / UC Course  I have reviewed the triage vital signs and the nursing notes.  Pertinent labs & imaging results that were available during my care of the patient were reviewed by me and considered in my medical decision making (see chart for details).   1. Muscle spasms of neck Evaluation suggests pain is musculoskeletal in nature. Will manage this with rest, gentle ROM exercises, heat therapy, tylenol as needed for pain.  Imaging: no indication for imaging based on stable musculoskeletal exam findings and atraumatic mechanism of injury/pain onset. May follow-up with orthopedics as needed. Work/school excise note given.  Of note, BP initially soft at 102/56. Re-check shows BP of 121/67. She denies dizziness and palpitations.   Counseled patient on potential for adverse effects with medications prescribed/recommended today, strict ER and return-to-clinic precautions discussed, patient verbalized understanding.    Final Clinical Impressions(s) / UC Diagnoses   Final diagnoses:  Muscle spasms of neck     Discharge Instructions      Your pain is likely due to a muscle strain which will improve on its own with time.   - You may take over the counter tylenol 1,000mg  every 6 hours as  needed for pain. - Apply heat 20 minutes on then 20 minutes off and perform gentle range of motion exercises to the area of greatest pain to prevent muscle stiffness and provide further pain relief.   Red flag symptoms to watch out for are numbness/tingling to the legs, weakness, loss of bowel/bladder control, and/or worsening pain that does not respond well to medicines. Follow-up with your primary care provider or return to urgent care if your symptoms do not improve in the next 3 to 4 days with medications and interventions recommended today. If your symptoms are severe (red flag), please go to the emergency room.       ED Prescriptions   None    PDMP not reviewed this  encounter.   Carlisle Beers, FNP 09/28/23 2158    Carlisle Beers, FNP 09/28/23 2159

## 2023-10-09 ENCOUNTER — Other Ambulatory Visit: Payer: Self-pay | Admitting: Internal Medicine

## 2023-10-09 DIAGNOSIS — E2839 Other primary ovarian failure: Secondary | ICD-10-CM

## 2023-12-21 ENCOUNTER — Other Ambulatory Visit: Payer: Self-pay | Admitting: Cardiology

## 2023-12-24 ENCOUNTER — Other Ambulatory Visit: Payer: Self-pay

## 2023-12-24 MED ORDER — ATORVASTATIN CALCIUM 40 MG PO TABS: ORAL_TABLET | ORAL | 1 refills | Status: DC

## 2024-01-01 ENCOUNTER — Other Ambulatory Visit: Payer: Self-pay | Admitting: Cardiology

## 2024-01-01 DIAGNOSIS — I5181 Takotsubo syndrome: Secondary | ICD-10-CM

## 2024-03-24 ENCOUNTER — Encounter: Payer: Self-pay | Admitting: Physician Assistant

## 2024-03-30 ENCOUNTER — Encounter: Payer: Self-pay | Admitting: Physician Assistant

## 2024-03-30 ENCOUNTER — Ambulatory Visit (INDEPENDENT_AMBULATORY_CARE_PROVIDER_SITE_OTHER): Admitting: Physician Assistant

## 2024-03-30 ENCOUNTER — Other Ambulatory Visit (INDEPENDENT_AMBULATORY_CARE_PROVIDER_SITE_OTHER)

## 2024-03-30 VITALS — BP 120/70 | HR 80 | Ht 60.0 in | Wt 99.4 lb

## 2024-03-30 DIAGNOSIS — D649 Anemia, unspecified: Secondary | ICD-10-CM

## 2024-03-30 DIAGNOSIS — R634 Abnormal weight loss: Secondary | ICD-10-CM

## 2024-03-30 DIAGNOSIS — R053 Chronic cough: Secondary | ICD-10-CM | POA: Diagnosis not present

## 2024-03-30 LAB — COMPREHENSIVE METABOLIC PANEL WITH GFR
ALT: 8 U/L (ref 0–35)
AST: 16 U/L (ref 0–37)
Albumin: 3.9 g/dL (ref 3.5–5.2)
Alkaline Phosphatase: 61 U/L (ref 39–117)
BUN: 12 mg/dL (ref 6–23)
CO2: 26 meq/L (ref 19–32)
Calcium: 9.7 mg/dL (ref 8.4–10.5)
Chloride: 104 meq/L (ref 96–112)
Creatinine, Ser: 1 mg/dL (ref 0.40–1.20)
GFR: 54.17 mL/min — ABNORMAL LOW (ref >60.00)
Glucose, Bld: 80 mg/dL (ref 70–99)
Potassium: 4.5 meq/L (ref 3.5–5.1)
Sodium: 139 meq/L (ref 135–145)
Total Bilirubin: 0.5 mg/dL (ref 0.2–1.2)
Total Protein: 7.8 g/dL (ref 6.0–8.3)

## 2024-03-30 LAB — CBC WITH DIFFERENTIAL/PLATELET
Basophils Absolute: 0 K/uL (ref 0.0–0.1)
Basophils Relative: 0.5 % (ref 0.0–3.0)
Eosinophils Absolute: 0.9 K/uL — ABNORMAL HIGH (ref 0.0–0.7)
Eosinophils Relative: 15 % — ABNORMAL HIGH (ref 0.0–5.0)
HCT: 30.9 % — ABNORMAL LOW (ref 36.0–46.0)
Hemoglobin: 10 g/dL — ABNORMAL LOW (ref 12.0–15.0)
Lymphocytes Relative: 36.9 % (ref 12.0–46.0)
Lymphs Abs: 2.3 K/uL (ref 0.7–4.0)
MCHC: 32.5 g/dL (ref 30.0–36.0)
MCV: 86.4 fl (ref 78.0–100.0)
Monocytes Absolute: 0.6 K/uL (ref 0.1–1.0)
Monocytes Relative: 9.9 % (ref 3.0–12.0)
Neutro Abs: 2.3 K/uL (ref 1.4–7.7)
Neutrophils Relative %: 37.7 % — ABNORMAL LOW (ref 43.0–77.0)
Platelets: 239 K/uL (ref 150.0–400.0)
RBC: 3.58 Mil/uL — ABNORMAL LOW (ref 3.87–5.11)
RDW: 15.1 % (ref 11.5–15.5)
WBC: 6.2 K/uL (ref 4.0–10.5)

## 2024-03-30 LAB — B12 AND FOLATE PANEL
Folate: 12.3 ng/mL (ref >5.9)
Vitamin B-12: 264 pg/mL (ref 211–911)

## 2024-03-30 LAB — IBC + FERRITIN
Ferritin: 15.4 ng/mL (ref 10.0–291.0)
Iron: 75 ug/dL (ref 42–145)
Saturation Ratios: 21 % (ref 20.0–50.0)
TIBC: 357 ug/dL (ref 250.0–450.0)
Transferrin: 255 mg/dL (ref 212.0–360.0)

## 2024-03-30 MED ORDER — NA SULFATE-K SULFATE-MG SULF 17.5-3.13-1.6 GM/177ML PO SOLN: 1.0000 | ORAL | 0 refills | Status: DC

## 2024-03-30 NOTE — Progress Notes (Signed)
 Chief Complaint: Weight loss  HPI:    Deanna Brown is a  78 y/o female with a past medical history as listed below including stroke on aspirin  81 mg, osteoporosis, PVCs, pulmonary hypertension, grade 1 diastolic dysfunction (LVEF 55-60% on echo 04/24/2023) and multiple others, previously known to Dr. Dianna in 2016, who was referred to me by Shelda Atlas, MD for a complaint of weight loss.      05/03/2018 Cologuard negative    06/30/2021 chest x-ray.  No active disease.  Mild hiatal hernia.    03/10/2023 CBC with a hemoglobin of 11 (8.1 on 09/05/2020), MCV normal.  CMP with a creatinine of 1.25 (around baseline).  Otherwise normal.    04/28/2023 echo noted a large hiatal hernia that abuts up against the left atrial wall.    03/01/2024 patient seen by PCP and at that time referred to us  for a screening colonoscopy.    Today, patient presents to clinic and explains that she has been losing weight.  She has lost a total of 19 pounds over the past 6 months without trying at all.  Tells me that her appetite feels the same and she has not changed her diet at all.  No new medications over the past year.  Other than weight loss she has also been experiencing a chronic cough for the past year for which they have tried various therapies through her PCP, but none of them have been helping.  She tells me she used to battle with reflux but does not feel those symptoms anymore.  Not currently on an acid suppressant.    Describes her last colonoscopy at least 13 years ago at [facility].  Unaware of findings.      Denies fever, chills, nausea, vomiting or abdominal pain, no change in bowel habits or blood in her stool.  Past Medical History:  Diagnosis Date   Asthma    Ejection fraction    Environmental allergies    GERD (gastroesophageal reflux disease)    High cholesterol    Hypertension    Myocardial infarction [facility])    01-10-13- [facility],cardiology -yearly visits next 5'2016, denies any further problems   Pulmonary  HTN (HCC)    a. Mod pulm HTN by cath 01/2013 (PASP ).10-30-14 tolerates walking 30 minutes contiuously x 3-4 times weekly, denies SOB or issues.   PVC's (premature ventricular contractions)    a. Noted during 01/2013 admission.   Seasonal allergies    Stroke [facility])    Slurred speech, unsteady gait 01-15-2013- no residual effects.   Takotsubo cardiomyopathy    a. NSTEMI 2/2 takotsubo 01/2013, no significant CAD by cath, EF 25%;  b. 01/2013 Echo: EF 25-30%, mod LVH, Sev HK in all segments except base. Mild/mod RV dysfxn, PASP .;  c.  Echo 07/2013: normal LVF with EF 60% and normal PAP   Uterine cancer (HCC)     Past Surgical History:  Procedure Laterality Date   ABDOMINAL HYSTERECTOMY  1999   BALLOON DILATION N/A 11/30/2014   Procedure: BALLOON DILATION;  Surgeon: Jerrell Dianna, MD;  Location: [facility] ENDOSCOPY;  Service: Endoscopy;  Laterality: N/A;   ESOPHAGOGASTRODUODENOSCOPY (EGD) WITH PROPOFOL  N/A 11/01/2014   Procedure: ESOPHAGOGASTRODUODENOSCOPY (EGD) WITH PROPOFOL ;  Surgeon: Lynwood Bohr, MD;  Location: [facility] ENDOSCOPY;  Service: Endoscopy;  Laterality: N/A;   ESOPHAGOGASTRODUODENOSCOPY (EGD) WITH PROPOFOL  N/A 11/30/2014   Procedure: ESOPHAGOGASTRODUODENOSCOPY (EGD) WITH PROPOFOL ;  Surgeon: Jerrell Dianna, MD;  Location: [facility] ENDOSCOPY;  Service: Endoscopy;  Laterality: N/A;   ETHMOIDECTOMY Bilateral  09/03/2020   Procedure: TOTAL ETHMOIDECTOMY;  Surgeon: Karis Clunes, MD;  Location: [facility];  Service: ENT;  Laterality: Bilateral;   FRONTAL SINUS EXPLORATION Bilateral 09/03/2020   Procedure: FRONTAL RECESS SINUS EXPLORATION;  Surgeon: Karis Clunes, MD;  Location: [facility];  Service: ENT;  Laterality: Bilateral;   LEFT AND RIGHT HEART CATHETERIZATION WITH CORONARY ANGIOGRAM N/A 01/11/2013   Procedure: LEFT AND RIGHT HEART CATHETERIZATION WITH CORONARY ANGIOGRAM;  Surgeon: Lynwood Schilling, MD;  Location: [facility] CATH LAB;  Service: Cardiovascular;  Laterality: N/A;    MAXILLARY ANTROSTOMY Bilateral 09/03/2020   Procedure: MAXILLARY ANTROSTOMY WITH TISSUE REMOVAL;  Surgeon: Karis Clunes, MD;  Location: [facility];  Service: ENT;  Laterality: Bilateral;   SAVORY DILATION N/A 11/01/2014   Procedure: SAVORY DILATION;  Surgeon: Lynwood Bohr, MD;  Location: [facility] ENDOSCOPY;  Service: Endoscopy;  Laterality: N/A;   SAVORY DILATION N/A 11/30/2014   Procedure: SAVORY DILATION;  Surgeon: Jerrell Sol, MD;  Location: [facility] ENDOSCOPY;  Service: Endoscopy;  Laterality: N/A;   SINUS ENDO WITH FUSION Bilateral 09/03/2020   Procedure: SINUS ENDOSCOPY WITH FUSION NAVIGATION;  Surgeon: Karis Clunes, MD;  Location: [facility];  Service: ENT;  Laterality: Bilateral;   SPHENOIDECTOMY Bilateral 09/03/2020   Procedure: SPHENOIDECTOMY WITH TISSUE REMOVAL;  Surgeon: Karis Clunes, MD;  Location: [facility];  Service: ENT;  Laterality: Bilateral;   TURBINATE REDUCTION Bilateral 09/03/2020   Procedure: BILATERAL TURBINATE REDUCTION;  Surgeon: Karis Clunes, MD;  Location: [facility];  Service: ENT;  Laterality: Bilateral;    Current Outpatient Medications  Medication Sig Dispense Refill   albuterol  (PROVENTIL  HFA;VENTOLIN  HFA) 108 (90 Base) MCG/ACT inhaler Inhale 2 puffs into the lungs every 6 (six) hours as needed for wheezing or shortness of breath.     alendronate (FOSAMAX) 70 MG tablet Take 70 mg by mouth every 7 (seven) days. Take with a full glass of water on an empty stomach.     amLODipine  (NORVASC ) 2.5 MG tablet Take 1 tablet (2.5 mg total) by mouth daily. 180 tablet 3   ASPIRIN  LOW DOSE 81 MG EC tablet Take 81 mg by mouth daily.     atorvastatin  (LIPITOR) 40 MG tablet TAKE 1 TABLET BY MOUTH EVERY DAY AT 6 PM 90 tablet 1   carboxymethylcellulose (REFRESH PLUS) 0.5 % SOLN 1 drop 3 (three) times daily as needed.     carvedilol  (COREG ) 25 MG tablet TAKE 1 TABLET (25 MG TOTAL) BY MOUTH TWICE A DAY WITH MEALS 180 tablet 1   dorzolamide (TRUSOPT)  2 % ophthalmic solution Place 1 drop into the right eye 2 (two) times daily.     fluticasone  (FLONASE ) 50 MCG/ACT nasal spray Place into both nostrils daily.     hydrochlorothiazide  (HYDRODIURIL ) 12.5 MG tablet TAKE 1 TABLET BY MOUTH DAILY AS NEEDED FOR SWELLING (Patient not taking: Reported on 04/02/2023) 90 tablet 3   latanoprost (XALATAN) 0.005 % ophthalmic solution 1 drop at bedtime.     loratadine  (CLARITIN ) 10 MG tablet Take 10 mg by mouth daily.     losartan  (COZAAR ) 100 MG tablet TAKE 1 TABLET BY MOUTH EVERY DAY 90 tablet 0   montelukast  (SINGULAIR ) 10 MG tablet Take 10 mg by mouth at bedtime.     predniSONE  (DELTASONE ) 5 MG tablet Take 5 mg by mouth daily as needed. (Patient not taking: Reported on 09/28/2023)     SYMBICORT 160-4.5 MCG/ACT inhaler INHALE 2 PUFFS TWICE DAILY TO PREVENT COUGH OR WHEEZE.  RINSE, GARGLE, AND SPIT AFTER USE. 1 Inhaler 0   No current facility-administered medications for this visit.    Allergies as of 03/30/2024 - Review Complete 09/28/2023  Allergen Reaction Noted   Guaifenesin  & derivatives Swelling and Other (See Comments) 11/28/2014   Cetirizine hcl Itching 06/28/2011   Plavix  [clopidogrel  bisulfate] Itching 02/26/2013   Xyzal [cetirizine hcl] Itching 02/26/2013    Family History  Problem Relation Age of Onset   Heart attack Mother    Hypertension Mother    Dementia Father    Healthy Sister    Healthy Brother    Healthy Sister    Healthy Sister    Healthy Brother    Healthy Brother     Social History   Socioeconomic History   Marital status: Widowed    Spouse name: Not on file   Number of children: 3   Years of education: 12th   Highest education level: Not on file  Occupational History   Occupation: clean building    Employer: [employer]  Tobacco Use   Smoking status: Never   Smokeless tobacco: Never  Vaping Use   Vaping status: Never Used  Substance and Sexual Activity   Alcohol use: No   Drug use: No   Sexual activity:  Never  Other Topics Concern   Not on file  Social History Narrative   Lives in [place] with her son.  Her dtr also lives nearby.   Social Drivers of Corporate investment banker Strain: Not on file  Food Insecurity: Not on file  Transportation Needs: Not on file  Physical Activity: Not on file  Stress: Not on file  Social Connections: Not on file  Intimate Partner Violence: Not on file    Review of Systems:    Constitutional: No fever or chills Skin: No rash  Cardiovascular: No chest pain Respiratory: No SOB  Gastrointestinal: See HPI and otherwise negative Genitourinary: No dysuria  Neurological: No headache, dizziness or syncope Musculoskeletal: No new muscle or joint pain Hematologic: No bleeding  Psychiatric: No history of depression or anxiety   Physical Exam:  Vital signs: BP 120/70   Pulse 80   Ht 5' (1.524 m)   Wt 99 lb 6 oz (45.1 kg)   BMI 19.41 kg/m    Constitutional:   Pleasant elderly AA female appears to be in NAD, Well developed, Well nourished, alert and cooperative Head:  Normocephalic and atraumatic. Eyes:   PEERL, EOMI. No icterus. Conjunctiva pink. Ears:  Normal auditory acuity. Neck:  Supple Throat: Oral cavity and pharynx without inflammation, swelling or lesion.  Respiratory: Respirations even and unlabored. Lungs clear to auscultation bilaterally.   No wheezes, crackles, or rhonchi.  Cardiovascular: Normal S1, S2. No MRG. Regular rate and rhythm. No peripheral edema, cyanosis or pallor.  Gastrointestinal:  Soft, nondistended, nontender. No rebound or guarding. Normal bowel sounds. No appreciable masses or hepatomegaly. Rectal:  Not performed.  Msk:  Symmetrical without gross deformities. Without edema, no deformity or joint abnormality.  Neurologic:  Alert and  oriented x4;  grossly normal neurologically.  Skin:   Dry and intact without significant lesions or rashes. Psychiatric: Demonstrates good judgement and reason without abnormal affect or  behaviors.  See HPI for most recent labs.  Assessment: 1.  Weight loss: Patient describes an unintentional weight loss of 19 pounds over the past 6 months, no changes in medication or appetite, no real abdominal pain or change in bowel habits, no blood in her stool; consider age-related  versus decline in intake versus malignancy 2.  Low hemoglobin: On last labs in 2024, apparently patient has been told she is anemic before, iron studies from 2022 were normal, does have an elevated creatinine and signs of CKD which could be contributing 3.  Chronic cough: Apparently tried on various treatments per her through her PCP none of which helped, previous history of reflux; consider relation to reflux versus postnasal drip versus other  Plan: 1.  Scheduled patient for diagnostic EGD and colonoscopy in the [facility] with weight loss and chronic cough.  This is scheduled with Dr. Nandigam per patient's request have a female physician.  Did provide the patient a detailed list of risks for the procedure and she agrees to proceed. Patient is appropriate for endoscopic procedure(s) in the ambulatory ([facility]) setting.  2.  Repeat labs today including CBC, CMP, iron studies, B12 and folate 3.  We will request records from [facility] in regards to last procedures 4.  Patient assigned to Dr. Shila today. 5.  Labs today including a CBC, CMP, iron studies with ferritin and B12 and folate 6.  Pending results from procedures could consider cross-sectional imaging  Delon Failing, PA-C [facility] 03/30/2024, 1:19 PM  Cc: Shelda Atlas, MD

## 2024-03-30 NOTE — Patient Instructions (Signed)
 You have been scheduled for an endoscopy and colonoscopy. Please follow the written instructions given to you at your visit today.  If you use inhalers (even only as needed), please bring them with you on the day of your procedure.  DO NOT TAKE 7 DAYS PRIOR TO TEST- Trulicity (dulaglutide) Ozempic, Wegovy (semaglutide) Mounjaro (tirzepatide) Bydureon Bcise (exanatide extended release)  DO NOT TAKE 1 DAY PRIOR TO YOUR TEST Rybelsus (semaglutide) Adlyxin (lixisenatide) Victoza (liraglutide) Byetta (exanatide) ___________________________________________________________________________  We have sent the following medications to your pharmacy for you to pick up at your convenience: Suprep   Your provider has requested that you go to the basement level for lab work before leaving today. Press B on the elevator. The lab is located at the first door on the left as you exit the elevator.  Due to recent changes in healthcare laws, you may see the results of your imaging and laboratory studies on MyChart before your provider has had a chance to review them.  We understand that in some cases there may be results that are confusing or concerning to you. Not all laboratory results come back in the same time frame and the provider may be waiting for multiple results in order to interpret others.  Please give us  48 hours in order for your provider to thoroughly review all the results before contacting the office for clarification of your results.   _______________________________________________________  If your blood pressure at your visit was 140/90 or greater, please contact your primary care physician to follow up on this.  _______________________________________________________  If you are age 61 or older, your body mass index should be between 23-30. Your Body mass index is 19.41 kg/m. If this is out of the aforementioned range listed, please consider follow up with your Primary Care  Provider.  If you are age 68 or younger, your body mass index should be between 19-25. Your Body mass index is 19.41 kg/m. If this is out of the aformentioned range listed, please consider follow up with your Primary Care Provider.   ________________________________________________________  The [facility] providers would like to encourage you to use MYCHART to communicate with providers for non-urgent requests or questions.  Due to long hold times on the telephone, sending your provider a message by [facility] may be a faster and more efficient way to get a response.  Please allow 48 business hours for a response.  Please remember that this is for non-urgent requests.  _______________________________________________________  [facility] is using a team-based approach to care.  Your team is made up of your doctor and two to three APPS. Our APPS (Nurse Practitioners and Physician Assistants) work with your physician to ensure care continuity for you. They are fully qualified to address your health concerns and develop a treatment plan. They communicate directly with your gastroenterologist to care for you. Seeing the Advanced Practice Practitioners on your physician's team can help you by facilitating care more promptly, often allowing for earlier appointments, access to diagnostic testing, procedures, and other specialty referrals.   Thank you for choosing me and [facility].  Jennifer Lemmon,PA-C

## 2024-03-31 ENCOUNTER — Ambulatory Visit: Payer: Self-pay | Admitting: Physician Assistant

## 2024-04-05 NOTE — Telephone Encounter (Signed)
 Inbound call from pt stating that she is returning a call. Please advise.

## 2024-04-07 ENCOUNTER — Ambulatory Visit: Attending: Cardiology | Admitting: Cardiology

## 2024-04-07 ENCOUNTER — Encounter: Payer: Self-pay | Admitting: Cardiology

## 2024-04-07 VITALS — BP 134/82 | HR 65 | Ht 60.0 in | Wt 100.0 lb

## 2024-04-07 DIAGNOSIS — I10 Essential (primary) hypertension: Secondary | ICD-10-CM

## 2024-04-07 DIAGNOSIS — Z79899 Other long term (current) drug therapy: Secondary | ICD-10-CM

## 2024-04-07 DIAGNOSIS — E7800 Pure hypercholesterolemia, unspecified: Secondary | ICD-10-CM

## 2024-04-07 DIAGNOSIS — I5181 Takotsubo syndrome: Secondary | ICD-10-CM

## 2024-04-07 LAB — LIPID PANEL

## 2024-04-07 NOTE — Progress Notes (Signed)
 Cardiology Office Note:    Date:  04/07/2024   ID:  Deanna Brown, DOB Sep 17, 1945, MRN [id number]  PCP:  Shelda Atlas, MD  [facility] Cardiologist:  Wilbert Bihari, MD  [facility] Electrophysiologist:  None   Chief Complaint: 22-months follow-up  History of Present Illness:    Deanna Brown is a 78 y.o. female with a hx of Takotsubo cardiomyopathy with initial EF 25 to 30% and pulmonary hypertension but then repeat echo 6 months later showed an EF of 60% and pulmonary hypertension have resolved.  She also has a history of hypertension, PVC, asthma, GERD.  She is here for follow-up today and is doing well.  She denies any chest pain or pressure, shortness of breath, PND, orthopnea, lower extreme edema, presyncope or syncope or palpitations.  Past Medical History:  Diagnosis Date   Asthma    Ejection fraction    Environmental allergies    GERD (gastroesophageal reflux disease)    High cholesterol    Hypertension    Myocardial infarction [facility])    01-10-13- [facility],cardiology -yearly visits next 5'2016, denies any further problems   Pulmonary HTN (HCC)    a. Mod pulm HTN by cath 01/2013 (PASP ).10-30-14 tolerates walking 30 minutes contiuously x 3-4 times weekly, denies SOB or issues.   PVC's (premature ventricular contractions)    a. Noted during 01/2013 admission.   Seasonal allergies    Stroke [facility])    Slurred speech, unsteady gait 01-15-2013- no residual effects.   Takotsubo cardiomyopathy    a. NSTEMI 2/2 takotsubo 01/2013, no significant CAD by cath, EF 25%;  b. 01/2013 Echo: EF 25-30%, mod LVH, Sev HK in all segments except base. Mild/mod RV dysfxn, PASP .;  c.  Echo 07/2013: normal LVF with EF 60% and normal PAP   Uterine cancer (HCC)     Past Surgical History:  Procedure Laterality Date   ABDOMINAL HYSTERECTOMY  1999   BALLOON DILATION N/A 11/30/2014   Procedure: BALLOON DILATION;  Surgeon: Jerrell Sol, MD;  Location: [facility] ENDOSCOPY;  Service:  Endoscopy;  Laterality: N/A;   ESOPHAGOGASTRODUODENOSCOPY (EGD) WITH PROPOFOL  N/A 11/01/2014   Procedure: ESOPHAGOGASTRODUODENOSCOPY (EGD) WITH PROPOFOL ;  Surgeon: Lynwood Bohr, MD;  Location: [facility] ENDOSCOPY;  Service: Endoscopy;  Laterality: N/A;   ESOPHAGOGASTRODUODENOSCOPY (EGD) WITH PROPOFOL  N/A 11/30/2014   Procedure: ESOPHAGOGASTRODUODENOSCOPY (EGD) WITH PROPOFOL ;  Surgeon: Jerrell Sol, MD;  Location: [facility] ENDOSCOPY;  Service: Endoscopy;  Laterality: N/A;   ETHMOIDECTOMY Bilateral 09/03/2020   Procedure: TOTAL ETHMOIDECTOMY;  Surgeon: Karis Clunes, MD;  Location: [facility];  Service: ENT;  Laterality: Bilateral;   FRONTAL SINUS EXPLORATION Bilateral 09/03/2020   Procedure: FRONTAL RECESS SINUS EXPLORATION;  Surgeon: Karis Clunes, MD;  Location: [facility];  Service: ENT;  Laterality: Bilateral;   LEFT AND RIGHT HEART CATHETERIZATION WITH CORONARY ANGIOGRAM N/A 01/11/2013   Procedure: LEFT AND RIGHT HEART CATHETERIZATION WITH CORONARY ANGIOGRAM;  Surgeon: Lynwood Schilling, MD;  Location: [facility] CATH LAB;  Service: Cardiovascular;  Laterality: N/A;   MAXILLARY ANTROSTOMY Bilateral 09/03/2020   Procedure: MAXILLARY ANTROSTOMY WITH TISSUE REMOVAL;  Surgeon: Karis Clunes, MD;  Location: [facility];  Service: ENT;  Laterality: Bilateral;   SAVORY DILATION N/A 11/01/2014   Procedure: SAVORY DILATION;  Surgeon: Lynwood Bohr, MD;  Location: [facility] ENDOSCOPY;  Service: Endoscopy;  Laterality: N/A;   SAVORY DILATION N/A 11/30/2014   Procedure: SAVORY DILATION;  Surgeon: Jerrell Sol, MD;  Location: [facility] ENDOSCOPY;  Service: Endoscopy;  Laterality: N/A;   SINUS  ENDO WITH FUSION Bilateral 09/03/2020   Procedure: SINUS ENDOSCOPY WITH FUSION NAVIGATION;  Surgeon: Karis Clunes, MD;  Location: [facility];  Service: ENT;  Laterality: Bilateral;   SPHENOIDECTOMY Bilateral 09/03/2020   Procedure: SPHENOIDECTOMY WITH TISSUE REMOVAL;  Surgeon: Karis Clunes, MD;  Location: [facility];  Service: ENT;  Laterality: Bilateral;   TURBINATE REDUCTION Bilateral 09/03/2020   Procedure: BILATERAL TURBINATE REDUCTION;  Surgeon: Karis Clunes, MD;  Location: [facility];  Service: ENT;  Laterality: Bilateral;    Current Medications: Current Meds  Medication Sig   albuterol  (PROVENTIL  HFA;VENTOLIN  HFA) 108 (90 Base) MCG/ACT inhaler Inhale 2 puffs into the lungs every 6 (six) hours as needed for wheezing or shortness of breath.   alendronate (FOSAMAX) 70 MG tablet Take 70 mg by mouth every 7 (seven) days. Take with a full glass of water on an empty stomach.   amLODipine  (NORVASC ) 2.5 MG tablet Take 1 tablet (2.5 mg total) by mouth daily.   ASPIRIN  LOW DOSE 81 MG EC tablet Take 81 mg by mouth daily.   atorvastatin  (LIPITOR) 40 MG tablet TAKE 1 TABLET BY MOUTH EVERY DAY AT 6 PM   carvedilol  (COREG ) 25 MG tablet TAKE 1 TABLET (25 MG TOTAL) BY MOUTH TWICE A DAY WITH MEALS   dorzolamide (TRUSOPT) 2 % ophthalmic solution Place 1 drop into the right eye 2 (two) times daily.   fluticasone  (FLONASE ) 50 MCG/ACT nasal spray Place into both nostrils daily.   latanoprost (XALATAN) 0.005 % ophthalmic solution 1 drop at bedtime.   losartan  (COZAAR ) 100 MG tablet TAKE 1 TABLET BY MOUTH EVERY DAY   montelukast  (SINGULAIR ) 10 MG tablet Take 10 mg by mouth at bedtime.   Spacer/Aero-Holding Chambers (AEROCHAMBER MV) inhaler as directed inhalation use with inhalers   SYMBICORT 160-4.5 MCG/ACT inhaler INHALE 2 PUFFS TWICE DAILY TO PREVENT COUGH OR WHEEZE. RINSE, GARGLE, AND SPIT AFTER USE.   [DISCONTINUED] carboxymethylcellulose (REFRESH PLUS) 0.5 % SOLN 1 drop 3 (three) times daily as needed.   [DISCONTINUED] loratadine  (CLARITIN ) 10 MG tablet Take 10 mg by mouth daily.   [DISCONTINUED] Na Sulfate-K Sulfate-Mg Sulfate concentrate (SUPREP BOWEL PREP KIT) 17.5-3.13-1.6 GM/177ML SOLN Take 1 kit (354 mLs total) by mouth as directed. For colonoscopy prep     Allergies:   Guaifenesin  &  derivatives, Cetirizine hcl, Plavix  [clopidogrel  bisulfate], and Xyzal [cetirizine hcl]   Social History   Socioeconomic History   Marital status: Widowed    Spouse name: Not on file   Number of children: 3   Years of education: 12th   Highest education level: Not on file  Occupational History   Occupation: clean building    Employer: [employer]  Tobacco Use   Smoking status: Never   Smokeless tobacco: Never  Vaping Use   Vaping status: Never Used  Substance and Sexual Activity   Alcohol use: No   Drug use: No   Sexual activity: Never  Other Topics Concern   Not on file  Social History Narrative   Lives in [place] with her son.  Her dtr also lives nearby.   Social Drivers of Corporate investment banker Strain: Not on file  Food Insecurity: Not on file  Transportation Needs: Not on file  Physical Activity: Not on file  Stress: Not on file  Social Connections: Not on file     Family History: The patient's family history includes Dementia in her father; Healthy in her brother, brother, brother, sister, sister, and  sister; Heart attack in her mother; Hypertension in her mother.    ROS:   Please see the history of present illness.    All other systems reviewed and are negative.   EKGs/Labs/Other Studies Reviewed:    The following studies were reviewed today:  EKG Interpretation Date/Time:  Thursday April 07 2024 10:03:43 EDT Ventricular Rate:  65 PR Interval:  182 QRS Duration:  92 QT Interval:  400 QTC Calculation: 416 R Axis:   44  Text Interpretation: Normal sinus rhythm Possible Left atrial enlargement When compared with ECG of 02-Apr-2023 08:39, Nonspecific T wave abnormality now evident in Lateral leads Confirmed by Shlomo Corning ([id number]) on 04/07/2024 10:21:18 AM       Recent Labs: 03/30/2024: ALT 8; BUN 12; Creatinine, Ser 1.00; Hemoglobin 10.0; Platelets 239.0; Potassium 4.5; Sodium 139  Recent Lipid Panel    Component Value Date/Time   CHOL 113  09/09/2022 0000   TRIG 59 09/09/2022 0000   HDL 45 (L) 09/09/2022 0000   CHOLHDL 2.5 09/09/2022 0000   VLDL 12.8 03/15/2013 1025   LDLCALC 54 09/09/2022 0000    Physical Exam:    VS:  BP 134/82   Pulse 65   Ht 5' (1.524 m)   Wt 100 lb (45.4 kg)   SpO2 99%   BMI 19.53 kg/m     Wt Readings from Last 3 Encounters:  04/07/24 100 lb (45.4 kg)  03/30/24 99 lb 6 oz (45.1 kg)  04/02/23 118 lb (53.5 kg)    GEN: Well nourished, well developed in no acute distress HEENT: Normal NECK: No JVD; No carotid bruits LYMPHATICS: No lymphadenopathy CARDIAC:RRR, no murmurs, rubs, gallops RESPIRATORY:  Clear to auscultation without rales, wheezing or rhonchi  ABDOMEN: Soft, non-tender, non-distended MUSCULOSKELETAL:  No edema; No deformity  SKIN: Warm and dry NEUROLOGIC:  Alert and oriented x 3 PSYCHIATRIC:  Normal affect  ASSESSMENT AND PLAN:    NICM -Hx of Takotsubo cardiomyopathy with normalization of LV function -Echo 04/2023: EF 55-60%, G1DD, normal PAP, mild MR -She appears euvolemic on exam today -Continue carvedilol  25 mg twice daily, losartan  100 mg daily with as needed refills  2.  Hypertension - BP controlled on exam today -Continue amlodipine  5 mg daily, carvedilol  25 mg twice daily and losartan  100 mg daily with as needed refills -I have personally reviewed and interpreted outside labs performed by patient's PCP which showed serum creatinine 1 and potassium 4.5 on 03/30/2024  3.  Hyperlipidemia -LDL goal less than 100  -I have personally reviewed and interpreted outside labs performed by patient's PCP which showed LDL 54, HDL 45 on 09/09/2022 and ALT 8 on 03/30/2024 -repeat FLP and ALT -Continue atorvastatin  40 mg daily with as needed refills  Medication Adjustments/Labs and Tests Ordered: Current medicines are reviewed at length with the patient today.  Concerns regarding medicines are outlined above.  Orders Placed This Encounter  Procedures   EKG 12-Lead   No orders  of the defined types were placed in this encounter.   There are no Patient Instructions on file for this visit.   Signed, Corning Shlomo, MD  04/07/2024 10:17 AM    [facility]

## 2024-04-07 NOTE — Patient Instructions (Signed)
 Medication Instructions:  Your physician recommends that you continue on your current medications as directed. Please refer to the Current Medication list given to you today.  *If you need a refill on your cardiac medications before your next appointment, please call your pharmacy*  Lab Work: Please complete a FASTINg lipid panel and an ALT in our first floor lab before you leave today.   If you have labs (blood work) drawn today and your tests are completely normal, you will receive your results only by: MyChart Message (if you have MyChart) OR A paper copy in the mail If you have any lab test that is abnormal or we need to change your treatment, we will call you to review the results.  Testing/Procedures: None.  Follow-Up: At [facility], you and your health needs are our priority.  As part of our continuing mission to provide you with exceptional heart care, our providers are all part of one team.  This team includes your primary Cardiologist (physician) and Advanced Practice Providers or APPs (Physician Assistants and Nurse Practitioners) who all work together to provide you with the care you need, when you need it.  Your next appointment:   1 year(s)  Provider:   Wilbert Bihari, MD

## 2024-04-07 NOTE — Addendum Note (Signed)
 Addended by: JANIT GENI CROME on: 04/07/2024 10:25 AM   Modules accepted: Orders

## 2024-04-08 ENCOUNTER — Ambulatory Visit: Payer: Self-pay | Admitting: Cardiology

## 2024-04-08 LAB — LIPID PANEL
Cholesterol, Total: 105 mg/dL (ref 100–199)
HDL: 46 mg/dL (ref >39)
LDL CALC COMMENT:: 2.3 ratio (ref 0.0–4.4)
LDL Chol Calc (NIH): 48 mg/dL (ref 0–99)
Triglycerides: 40 mg/dL (ref 0–149)
VLDL Cholesterol Cal: 11 mg/dL (ref 5–40)

## 2024-04-08 LAB — ALT: ALT: 9 IU/L (ref 0–32)

## 2024-04-13 NOTE — Telephone Encounter (Signed)
 Spoke to patient to advise Lipids at goal and to continue current therapy. Patient verbalizes understanding, labs forwarded to PCP 9/5.

## 2024-04-13 NOTE — Telephone Encounter (Signed)
Patient was returning your call. Please advise 

## 2024-04-13 NOTE — Telephone Encounter (Signed)
-----   Message from Wilbert Bihari sent at 04/08/2024  8:27 AM EDT ----- Lipids at goal continue current therapy and forward to PCP ----- Message ----- From: Interface, [facility] Lab Results In Sent: 04/07/2024  11:36 PM EDT To: Wilbert JONELLE Bihari, MD

## 2024-04-13 NOTE — Telephone Encounter (Signed)
 Call to patient to discuss lipid panel results. No answer, no DPR on file, left message with no identifiers asking recipient to call [place] at our office number.

## 2024-04-25 ENCOUNTER — Other Ambulatory Visit: Payer: Self-pay | Admitting: Cardiology

## 2024-05-03 ENCOUNTER — Encounter: Payer: Self-pay | Admitting: Gastroenterology

## 2024-05-03 ENCOUNTER — Ambulatory Visit (AMBULATORY_SURGERY_CENTER): Admitting: Gastroenterology

## 2024-05-03 VITALS — BP 128/78 | HR 69 | Temp 97.2°F | Resp 20 | Ht 60.0 in | Wt 99.0 lb

## 2024-05-03 DIAGNOSIS — R634 Abnormal weight loss: Secondary | ICD-10-CM

## 2024-05-03 DIAGNOSIS — K644 Residual hemorrhoidal skin tags: Secondary | ICD-10-CM

## 2024-05-03 DIAGNOSIS — K219 Gastro-esophageal reflux disease without esophagitis: Secondary | ICD-10-CM | POA: Diagnosis not present

## 2024-05-03 DIAGNOSIS — D509 Iron deficiency anemia, unspecified: Secondary | ICD-10-CM

## 2024-05-03 DIAGNOSIS — D508 Other iron deficiency anemias: Secondary | ICD-10-CM

## 2024-05-03 DIAGNOSIS — K5730 Diverticulosis of large intestine without perforation or abscess without bleeding: Secondary | ICD-10-CM | POA: Diagnosis not present

## 2024-05-03 DIAGNOSIS — K648 Other hemorrhoids: Secondary | ICD-10-CM

## 2024-05-03 DIAGNOSIS — R053 Chronic cough: Secondary | ICD-10-CM

## 2024-05-03 DIAGNOSIS — D638 Anemia in other chronic diseases classified elsewhere: Secondary | ICD-10-CM

## 2024-05-03 MED ORDER — SODIUM CHLORIDE 0.9 % IV SOLN: 500.0000 mL | INTRAVENOUS | Status: DC

## 2024-05-03 NOTE — Op Note (Signed)
 [facility] Patient Name: Deanna Brown Procedure Date: 05/03/2024 3:21 PM MRN: [id number] Endoscopist: Gustav ALONSO Mcgee , MD, [id number] Age: 78 Referring MD:  Date of Birth: [birth year] Gender: Female Account #: [id number] Procedure:                Colonoscopy Indications:              Unexplained iron deficiency anemia, Weight loss Medicines:                Monitored Anesthesia Care Procedure:                Pre-Anesthesia Assessment:                           - Prior to the procedure, a History and Physical                            was performed, and patient medications and                            allergies were reviewed. The patient's tolerance of                            previous anesthesia was also reviewed. The risks                            and benefits of the procedure and the sedation                            options and risks were discussed with the patient.                            All questions were answered, and informed consent                            was obtained. Prior Anticoagulants: The patient has                            taken no anticoagulant or antiplatelet agents. ASA                            Grade Assessment: III - A patient with severe                            systemic disease. After reviewing the risks and                            benefits, the patient was deemed in satisfactory                            condition to undergo the procedure.                           After obtaining informed consent, the colonoscope  was passed under direct vision. Throughout the                            procedure, the patient's blood pressure, pulse, and                            oxygen  saturations were monitored continuously. The                            Olympus Scope SN: [id number] was introduced through                            the anus and advanced to the the cecum, identified                             by appendiceal orifice and ileocecal valve. The                            colonoscopy was performed without difficulty. The                            patient tolerated the procedure well. The quality                            of the bowel preparation was good. The ileocecal                            valve, appendiceal orifice, and rectum were                            photographed. Scope In: 3:47:13 PM Scope Out: 4:05:28 PM Scope Withdrawal Time: 0 hours 6 minutes 29 seconds  Total Procedure Duration: 0 hours 18 minutes 15 seconds  Findings:                 The perianal and digital rectal examinations were                            normal.                           Scattered large-mouthed, medium-mouthed and                            small-mouthed diverticula were found in the sigmoid                            colon, transverse colon and ascending colon.                           Non-bleeding external and internal hemorrhoids were                            found during retroflexion. The hemorrhoids were  small. Complications:            No immediate complications. Estimated Blood Loss:     Estimated blood loss: none. Impression:               - Moderate diverticulosis in the sigmoid colon, in                            the transverse colon and in the ascending colon.                           - Non-bleeding external and internal hemorrhoids.                           - No specimens collected. Recommendation:           - Resume previous diet.                           - Continue present medications.                           - No repeat colonoscopy due to age. Yassen Kinnett V. Shivali Quackenbush, MD 05/03/2024 4:10:57 PM This report has been signed electronically.

## 2024-05-03 NOTE — Progress Notes (Signed)
 [facility] History and Physical   Primary Care Physician:  Shelda Atlas, MD   Reason for Procedure:  Weight loss, chronic anemia, GERD  Plan:    EGD and colonoscopy with possible interventions as needed     HPI: Deanna Brown is a very pleasant 78 y.o. female here for EGD and colonoscopy for unintentional weight loss, anemia and GERD. Please refer to office visit note by Delon Failing for details  The risks and benefits as well as alternatives of endoscopic procedure(s) have been discussed and reviewed. All questions answered. The patient agrees to proceed.    Past Medical History:  Diagnosis Date   Allergy    Asthma    Cataract    COPD (chronic obstructive pulmonary disease) (HCC)    Ejection fraction    Environmental allergies    GERD (gastroesophageal reflux disease)    Glaucoma    High cholesterol    Hypertension    Myocardial infarction [facility])    01-10-13- [facility],cardiology -yearly visits next 5'2016, denies any further problems   Pulmonary HTN (HCC)    a. Mod pulm HTN by cath 01/2013 (PASP ).10-30-14 tolerates walking 30 minutes contiuously x 3-4 times weekly, denies SOB or issues.   PVC's (premature ventricular contractions)    a. Noted during 01/2013 admission.   Seasonal allergies    Stroke [facility])    Slurred speech, unsteady gait 01-15-2013- no residual effects.   Takotsubo cardiomyopathy    a. NSTEMI 2/2 takotsubo 01/2013, no significant CAD by cath, EF 25%;  b. 01/2013 Echo: EF 25-30%, mod LVH, Sev HK in all segments except base. Mild/mod RV dysfxn, PASP .;  c.  Echo 07/2013: normal LVF with EF 60% and normal PAP   Uterine cancer (HCC)     Past Surgical History:  Procedure Laterality Date   ABDOMINAL HYSTERECTOMY  1999   BALLOON DILATION N/A 11/30/2014   Procedure: BALLOON DILATION;  Surgeon: Jerrell Sol, MD;  Location: [facility] ENDOSCOPY;  Service: Endoscopy;  Laterality: N/A;   ESOPHAGOGASTRODUODENOSCOPY (EGD) WITH PROPOFOL  N/A  11/01/2014   Procedure: ESOPHAGOGASTRODUODENOSCOPY (EGD) WITH PROPOFOL ;  Surgeon: Lynwood Bohr, MD;  Location: [facility] ENDOSCOPY;  Service: Endoscopy;  Laterality: N/A;   ESOPHAGOGASTRODUODENOSCOPY (EGD) WITH PROPOFOL  N/A 11/30/2014   Procedure: ESOPHAGOGASTRODUODENOSCOPY (EGD) WITH PROPOFOL ;  Surgeon: Jerrell Sol, MD;  Location: [facility] ENDOSCOPY;  Service: Endoscopy;  Laterality: N/A;   ETHMOIDECTOMY Bilateral 09/03/2020   Procedure: TOTAL ETHMOIDECTOMY;  Surgeon: Karis Clunes, MD;  Location: [facility];  Service: ENT;  Laterality: Bilateral;   FRONTAL SINUS EXPLORATION Bilateral 09/03/2020   Procedure: FRONTAL RECESS SINUS EXPLORATION;  Surgeon: Karis Clunes, MD;  Location: [facility];  Service: ENT;  Laterality: Bilateral;   LEFT AND RIGHT HEART CATHETERIZATION WITH CORONARY ANGIOGRAM N/A 01/11/2013   Procedure: LEFT AND RIGHT HEART CATHETERIZATION WITH CORONARY ANGIOGRAM;  Surgeon: Lynwood Schilling, MD;  Location: [facility] C[facility] LAB;  Service: Cardiovascular;  Laterality: N/A;   MAXILLARY ANTROSTOMY Bilateral 09/03/2020   Procedure: MAXILLARY ANTROSTOMY WITH TISSUE REMOVAL;  Surgeon: Karis Clunes, MD;  Location: [facility];  Service: ENT;  Laterality: Bilateral;   SAVORY DILATION N/A 11/01/2014   Procedure: SAVORY DILATION;  Surgeon: Lynwood Bohr, MD;  Location: [facility] ENDOSCOPY;  Service: Endoscopy;  Laterality: N/A;   SAVORY DILATION N/A 11/30/2014   Procedure: SAVORY DILATION;  Surgeon: Jerrell Sol, MD;  Location: [facility]COPY;  Service: Endoscopy;  Laterality: N/A;   SINUS ENDO WITH FUSION Bilateral 09/03/2020   Procedure: SINUS ENDOSCOPY WITH FUSION NAVIGATION;  Surgeon: Karis Clunes, MD;  Location: [facility];  Service: ENT;  Laterality: Bilateral;   SPHENOIDECTOMY Bilateral 09/03/2020   Procedure: SPHENOIDECTOMY WITH TISSUE REMOVAL;  Surgeon: Karis Clunes, MD;  Location: [facility];  Service: ENT;  Laterality: Bilateral;   TURBINATE REDUCTION  Bilateral 09/03/2020   Procedure: BILATERAL TURBINATE REDUCTION;  Surgeon: Karis Clunes, MD;  Location: [facility];  Service: ENT;  Laterality: Bilateral;    Prior to Admission medications   Medication Sig Start Date End Date Taking? Authorizing Provider  alendronate (FOSAMAX) 70 MG tablet Take 70 mg by mouth every 7 (seven) days. Take with a full glass of water on an empty stomach.   Yes [provider]  amLODipine  (NORVASC ) 2.5 MG tablet Take 1 tablet (2.5 mg total) by mouth daily. 04/02/23 05/03/24 Yes Turner, Wilbert SAUNDERS, MD  ASPIRIN  LOW DOSE 81 MG EC tablet Take 81 mg by mouth daily. 09/14/20  Yes [provider]  atorvastatin  (LIPITOR) 40 MG tablet TAKE 1 TABLET BY MOUTH EVERY DAY AT 6 PM 12/24/23  Yes Turner, Traci R, MD  carvedilol  (COREG ) 25 MG tablet TAKE 1 TABLET (25 MG TOTAL) BY MOUTH TWICE A DAY WITH MEALS 01/01/24  Yes Turner, Traci R, MD  dorzolamide (TRUSOPT) 2 % ophthalmic solution Place 1 drop into the right eye 2 (two) times daily.   Yes [provider]  fluticasone  (FLONASE ) 50 MCG/ACT nasal spray Place into both nostrils daily.   Yes [provider]  latanoprost (XALATAN) 0.005 % ophthalmic solution 1 drop at bedtime. 09/20/19  Yes [provider]  losartan  (COZAAR ) 100 MG tablet TAKE 1 TABLET BY MOUTH EVERY DAY 04/26/24  Yes Turner, Wilbert SAUNDERS, MD  montelukast  (SINGULAIR ) 10 MG tablet Take 10 mg by mouth at bedtime.   Yes [provider]  SYMBICORT 160-4.5 MCG/ACT inhaler INHALE 2 PUFFS TWICE DAILY TO PREVENT COUGH OR WHEEZE. RINSE, GARGLE, AND SPIT AFTER USE. 07/31/15  Yes Bardelas, Aloysius LABOR, MD  albuterol  (PROVENTIL  HFA;VENTOLIN  HFA) 108 (90 Base) MCG/ACT inhaler Inhale 2 puffs into the lungs every 6 (six) hours as needed for wheezing or shortness of breath.    [provider]  Spacer/Aero-Holding Chambers (AEROCHAMBER MV) inhaler as directed inhalation use with inhalers    [provider]    Current  Outpatient Medications  Medication Sig Dispense Refill   alendronate (FOSAMAX) 70 MG tablet Take 70 mg by mouth every 7 (seven) days. Take with a full glass of water on an empty stomach.     amLODipine  (NORVASC ) 2.5 MG tablet Take 1 tablet (2.5 mg total) by mouth daily. 180 tablet 3   ASPIRIN  LOW DOSE 81 MG EC tablet Take 81 mg by mouth daily.     atorvastatin  (LIPITOR) 40 MG tablet TAKE 1 TABLET BY MOUTH EVERY DAY AT 6 PM 90 tablet 1   carvedilol  (COREG ) 25 MG tablet TAKE 1 TABLET (25 MG TOTAL) BY MOUTH TWICE A DAY WITH MEALS 180 tablet 1   dorzolamide (TRUSOPT) 2 % ophthalmic solution Place 1 drop into the right eye 2 (two) times daily.     fluticasone  (FLONASE ) 50 MCG/ACT nasal spray Place into both nostrils daily.     latanoprost (XALATAN) 0.005 % ophthalmic solution 1 drop at bedtime.     losartan  (COZAAR ) 100 MG tablet TAKE 1 TABLET BY MOUTH EVERY DAY 90 tablet 3   montelukast  (SINGULAIR ) 10 MG tablet Take 10 mg by mouth at bedtime.     SYMBICORT  160-4.5 MCG/ACT inhaler INHALE 2 PUFFS TWICE DAILY TO PREVENT COUGH OR WHEEZE. RINSE, GARGLE, AND SPIT AFTER USE. 1 Inhaler 0   albuterol  (PROVENTIL  HFA;VENTOLIN  HFA) 108 (90 Base) MCG/ACT inhaler Inhale 2 puffs into the lungs every 6 (six) hours as needed for wheezing or shortness of breath.     Spacer/Aero-Holding Chambers (AEROCHAMBER MV) inhaler as directed inhalation use with inhalers     Current Facility-Administered Medications  Medication Dose Route Frequency Provider Last Rate Last Admin   0.9 %  sodium chloride  infusion  500 mL Intravenous Continuous Gwendelyn Lanting V, MD        Allergies as of 05/03/2024 - Review Complete 05/03/2024  Allergen Reaction Noted   Guaifenesin  & derivatives Swelling and Other (See Comments) 11/28/2014   Cetirizine hcl Itching 06/28/2011   Plavix  [clopidogrel  bisulfate] Itching 02/26/2013   Xyzal [cetirizine hcl] Itching 02/26/2013    Family History  Problem Relation Age of Onset   Heart attack  Mother    Hypertension Mother    Dementia Father    Healthy Sister    Healthy Sister    Healthy Sister    Healthy Brother    Healthy Brother    Healthy Brother    Colon cancer Paternal Aunt    Esophageal cancer Neg Hx    Rectal cancer Neg Hx    Prostate cancer Neg Hx     Social History   Socioeconomic History   Marital status: Widowed    Spouse name: Not on file   Number of children: 3   Years of education: 12th   Highest education level: Not on file  Occupational History   Occupation: clean building    Employer: [employer]  Tobacco Use   Smoking status: Never   Smokeless tobacco: Never  Vaping Use   Vaping status: Never Used  Substance and Sexual Activity   Alcohol use: No   Drug use: No   Sexual activity: Never  Other Topics Concern   Not on file  Social History Narrative   Lives in [place] with her son.  Her dtr also lives nearby.   Social Drivers of Corporate investment banker Strain: Not on file  Food Insecurity: Not on file  Transportation Needs: Not on file  Physical Activity: Not on file  Stress: Not on file  Social Connections: Not on file  Intimate Partner Violence: Not on file    Review of Systems:  All other review of systems negative except as mentioned in the HPI.  Physical Exam: Vital signs in last 24 hours: BP (!) 143/78   Pulse 60   Temp (!) 97.2 F (36.2 C) (Skin)   Ht 5' (1.524 m)   Wt 99 lb (44.9 kg)   SpO2 100%   BMI 19.33 kg/m  General:   Alert, NAD Lungs:  Clear .   Heart:  Regular rate and rhythm Abdomen:  Soft, nontender and nondistended. Neuro/Psych:  Alert and cooperative. Normal mood and affect. A and O x 3  Reviewed labs, radiology imaging, old records and pertinent past GI work up  Patient is appropriate for planned procedure(s) and anesthesia in an ambulatory setting   K. Veena Regis Hinton , MD [phone]

## 2024-05-03 NOTE — Op Note (Signed)
 [facility] Patient Name: Deanna Brown Procedure Date: 05/03/2024 3:22 PM MRN: [id number] Endoscopist: Gustav ALONSO Mcgee , MD, [id number] Age: 78 Referring MD:  Date of Birth: [birth year] Gender: Female Account #: [id number] Procedure:                Upper GI endoscopy Indications:              Unexplained iron deficiency anemia, Weight loss Medicines:                Monitored Anesthesia Care Procedure:                Pre-Anesthesia Assessment:                           - Prior to the procedure, a History and Physical                            was performed, and patient medications and                            allergies were reviewed. The patient's tolerance of                            previous anesthesia was also reviewed. The risks                            and benefits of the procedure and the sedation                            options and risks were discussed with the patient.                            All questions were answered, and informed consent                            was obtained. Prior Anticoagulants: The patient has                            taken no anticoagulant or antiplatelet agents. ASA                            Grade Assessment: III - A patient with severe                            systemic disease. After reviewing the risks and                            benefits, the patient was deemed in satisfactory                            condition to undergo the procedure.                           After obtaining informed consent, the endoscope was  passed under direct vision. Throughout the                            procedure, the patient's blood pressure, pulse, and                            oxygen  saturations were monitored continuously. The                            Olympus scope [id number] was introduced through the                            mouth, with the intention of advancing to the                             duodenum. The scope was advanced to the upper third                            of the esophagus before the procedure was aborted.                            Medications were given. The upper GI endoscopy was                            accomplished without difficulty. The patient                            tolerated the procedure well. 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 The stenosis was not traversed.                            Aborted Complications:            No immediate complications. Estimated Blood Loss:     Estimated blood loss: none. 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                            Unable to extend scope, procedure aborted                           - No specimens collected. Recommendation:           - Mechanical soft diet.                           - Continue present medications.                           -  Repeat upper endoscopy at Hospital endoscopy unit                            under general anaesthesia at appointment to be                            scheduled.                           - Schedule CT chest for further evlaution of weight                            loss, chronic cough, exclude mediastinal mass Gustav ALONSO Mcgee, MD 05/03/2024 4:15:32 PM This report has been signed electronically.

## 2024-05-03 NOTE — Patient Instructions (Signed)
 Resume previous diet- soft diet. Continue present medications  Repeat upper endoscopy at hospital endoscopy unit (our team with schedule this) Schedule CT chest for further evaluation (our team will schedule this) No need for repeat colonoscopy  See handouts for diverticulosis and hemorrhoids  YOU HAD AN ENDOSCOPIC PROCEDURE TODAY AT THE [facility]:   Refer to the procedure report that was given to you for any specific questions about what was found during the examination.  If the procedure report does not answer your questions, please call your gastroenterologist to clarify.  If you requested that your care partner not be given the details of your procedure findings, then the procedure report has been included in a sealed envelope for you to review at your convenience later.  YOU SHOULD EXPECT: Some feelings of bloating in the abdomen. Passage of more gas than usual.  Walking can help get rid of the air that was put into your GI tract during the procedure and reduce the bloating. If you had a lower endoscopy (such as a colonoscopy or flexible sigmoidoscopy) you may notice spotting of blood in your stool or on the toilet paper. If you underwent a bowel prep for your procedure, you may not have a normal bowel movement for a few days.  Please Note:  You might notice some irritation and congestion in your nose or some drainage.  This is from the oxygen  used during your procedure.  There is no need for concern and it should clear up in a day or so.  SYMPTOMS TO REPORT IMMEDIATELY: Following lower endoscopy (colonoscopy or flexible sigmoidoscopy):  Excessive amounts of blood in the stool  Significant tenderness or worsening of abdominal pains  Swelling of the abdomen that is new, acute  Fever of 100F or higher  Following upper endoscopy (EGD)  Vomiting of blood or coffee ground material  New chest pain or pain under the shoulder blades  Painful or persistently difficult  swallowing  New shortness of breath  Black, tarry-looking stools  For urgent or emergent issues, a gastroenterologist can be reached at any hour by calling [phone]. Do not use MyChart messaging for urgent concerns.   DIET:  We do recommend a small meal at first, but then you may proceed to your regular diet.  Drink plenty of fluids but you should avoid alcoholic beverages for 24 hours.  ACTIVITY:  You should plan to take it easy for the rest of today and you should NOT DRIVE or use heavy machinery until tomorrow (because of the sedation medicines used during the test).    FOLLOW UP: Our staff will call the number listed on your records the next business day following your procedure.  We will call around 7:15- 8:00 am to check on you and address any questions or concerns that you may have regarding the information given to you following your procedure. If we do not reach you, we will leave a message.     If any biopsies were taken you will be contacted by phone or by letter within the next 1-3 weeks.  Please call us  at [phone] if you have not heard about the biopsies in 3 weeks.   SIGNATURES/CONFIDENTIALITY: You and/or your care partner have signed paperwork which will be entered into your electronic medical record.  These signatures attest to the fact that that the information above on your After Visit Summary has been reviewed and is understood.  Full responsibility of the confidentiality of this discharge information lies  with you and/or your care-partner.

## 2024-05-03 NOTE — Progress Notes (Signed)
 Vs by SM

## 2024-05-03 NOTE — Progress Notes (Signed)
 To pacu, VSS. Report to [email or web address]

## 2024-05-04 ENCOUNTER — Telehealth: Payer: Self-pay | Admitting: *Deleted

## 2024-05-04 DIAGNOSIS — R634 Abnormal weight loss: Secondary | ICD-10-CM

## 2024-05-04 DIAGNOSIS — R053 Chronic cough: Secondary | ICD-10-CM

## 2024-05-04 NOTE — Telephone Encounter (Signed)
 Dr Shila   Does the CT Chest you want ordered with or without contrast ?

## 2024-05-04 NOTE — Telephone Encounter (Signed)
  Follow up Call-     05/03/2024    2:44 PM  Call back number  Post procedure Call Back phone  # [phone]  Permission to leave phone message Yes     Patient questions:  Do you have a fever, pain , or abdominal swelling? No. Pain Score  0 *  Have you tolerated food without any problems? Yes.    Have you been able to return to your normal activities? Yes.    Do you have any questions about your discharge instructions: Diet   No. Medications  No. Follow up visit  No.  Do you have questions or concerns about your Care? No.  Actions: * If pain score is 4 or above: No action needed, pain <4.

## 2024-05-05 NOTE — Telephone Encounter (Signed)
 Please order CT chest without contrast.  Thank you

## 2024-05-06 ENCOUNTER — Encounter: Payer: Self-pay | Admitting: *Deleted

## 2024-05-06 ENCOUNTER — Other Ambulatory Visit: Payer: Self-pay | Admitting: *Deleted

## 2024-05-06 DIAGNOSIS — R053 Chronic cough: Secondary | ICD-10-CM

## 2024-05-06 DIAGNOSIS — R634 Abnormal weight loss: Secondary | ICD-10-CM

## 2024-05-06 DIAGNOSIS — K219 Gastro-esophageal reflux disease without esophagitis: Secondary | ICD-10-CM

## 2024-05-06 DIAGNOSIS — D638 Anemia in other chronic diseases classified elsewhere: Secondary | ICD-10-CM

## 2024-05-06 NOTE — Telephone Encounter (Signed)
 CT Chest scheduled on 05/13/2024 at 8:30 am, arrival 8:00 am   EGD is scheduled at [facility] on 07/19/2024 at 11:45 am  Tried to contact patient but no answer no voicemeail   she does have My Chart

## 2024-05-10 ENCOUNTER — Telehealth: Payer: Self-pay | Admitting: Gastroenterology

## 2024-05-10 NOTE — Telephone Encounter (Signed)
 Inbound call from patient stating she was unaware of CT scan that's scheduled for Friday 05/13/24. Patient wanting to reschedule since she will be out of town after 12pm on 05/12/24. Requesting a call back  Please advise  Thank you

## 2024-05-10 NOTE — Telephone Encounter (Signed)
 Returned patient call. Provided radiology central scheduling phne number  to patient who understands she will need to call and reschedule her CT scan for a date that works for her.

## 2024-05-13 ENCOUNTER — Ambulatory Visit (HOSPITAL_COMMUNITY)

## 2024-05-16 ENCOUNTER — Ambulatory Visit (HOSPITAL_COMMUNITY)
Admission: RE | Admit: 2024-05-16 | Discharge: 2024-05-16 | Disposition: A | Discharge status: Home / Self Care | Source: Ambulatory Visit | Attending: Gastroenterology | Admitting: Gastroenterology

## 2024-05-16 DIAGNOSIS — R634 Abnormal weight loss: Secondary | ICD-10-CM | POA: Insufficient documentation

## 2024-05-16 DIAGNOSIS — R053 Chronic cough: Secondary | ICD-10-CM | POA: Diagnosis present

## 2024-05-16 NOTE — Telephone Encounter (Signed)
 She completed her CT Chest

## 2024-05-18 ENCOUNTER — Ambulatory Visit: Payer: Self-pay | Admitting: Gastroenterology

## 2024-05-18 DIAGNOSIS — R9389 Abnormal findings on diagnostic imaging of other specified body structures: Secondary | ICD-10-CM

## 2024-05-18 DIAGNOSIS — R053 Chronic cough: Secondary | ICD-10-CM

## 2024-06-02 ENCOUNTER — Telehealth: Payer: Self-pay | Admitting: Gastroenterology

## 2024-06-02 NOTE — Telephone Encounter (Signed)
 Inbound call from patient stating she hasn't heard back from pulmonary office and referral was sent. Patient stated she was advised to call back if she didn't hear back from office Requesting a call back  Please advise  Thank you

## 2024-06-06 ENCOUNTER — Telehealth: Payer: Self-pay | Admitting: Cardiology

## 2024-06-06 NOTE — Telephone Encounter (Signed)
 Returned call to pt. Referral has been approved to pulmonology. Gave pt the phone number [phone] to call and schedule an appt.

## 2024-06-06 NOTE — Telephone Encounter (Signed)
 Spoke with the patient and advised to check in with her pharmacy to see if hers is part of the recall. If it is they should provide her with a new bottle that is not part of the recall. Patient verbalized understanding.

## 2024-06-06 NOTE — Telephone Encounter (Signed)
 Patient calling in regards to previous message  Please advise  Thank you

## 2024-06-06 NOTE — Telephone Encounter (Signed)
  Pt c/o medication issue:  1. Name of Medication: atorvastatin  (LIPITOR) 40 MG tablet   2. How are you currently taking this medication (dosage and times per day)?  As directed  3. Are you having a reaction (difficulty breathing--STAT)? no  4. What is your medication issue? Medication has had a recall and she would like to know if she should continue to take it or not

## 2024-06-14 ENCOUNTER — Ambulatory Visit (INDEPENDENT_AMBULATORY_CARE_PROVIDER_SITE_OTHER)

## 2024-06-14 VITALS — BP 127/71 | HR 79 | Temp 98.6°F | Ht 60.0 in | Wt 101.6 lb

## 2024-06-14 DIAGNOSIS — K219 Gastro-esophageal reflux disease without esophagitis: Secondary | ICD-10-CM

## 2024-06-14 DIAGNOSIS — J45909 Unspecified asthma, uncomplicated: Secondary | ICD-10-CM | POA: Diagnosis not present

## 2024-06-14 DIAGNOSIS — J219 Acute bronchiolitis, unspecified: Secondary | ICD-10-CM

## 2024-06-14 DIAGNOSIS — R053 Chronic cough: Secondary | ICD-10-CM

## 2024-06-14 MED ORDER — BENZONATATE 200 MG PO CAPS: 200.0000 mg | ORAL_CAPSULE | Freq: Three times a day (TID) | ORAL | 1 refills | Status: AC | PRN

## 2024-06-14 NOTE — Progress Notes (Signed)
 Subjective:   PATIENT ID: Dorothyann JINNY Bucks GENDER: female DOB: [birth year], MRN: [id number]   HPI Discussed the use of AI scribe software for clinical note transcription with the patient, who gave verbal consent to proceed.  History of Present Illness LILLYANNA GLANDON is a 78 year old female with asthma who presents with chronic cough and weight loss.  She has experienced a chronic cough for about two to three years, characterized by clear phlegm production. The cough is persistent throughout the year and is more pronounced at night. No fever or chills. She sometimes experiences difficulty swallowing, particularly with certain foods, but denies aspiration. She sometimes uses water to alleviate the cough.  She has a history of asthma, which developed after her husband's passing twenty years ago. She uses a Symbicort inhaler, two puffs twice daily, which helps with her breathing, and also has a rescue albuterol  inhaler. She has never smoked, but her late husband was a smoker.  She has experienced significant weight loss, losing nineteen pounds this year, with her current weight at 100 pounds, down from 121 pounds. She has a history of esophageal dilation but has not undergone the procedure recently.  She has a history of acid reflux for which she previously took Protonix , but she stopped it about three years ago. No recent symptoms of acid reflux. She underwent nasal surgery in 2021 and experiences occasional sinus congestion but denies postnasal drip. She uses Flonase  daily for sinus congestion.  She reports seasonal allergies and takes medication for it, which provides some relief for her cough. No other allergies.     Past Medical History:  Diagnosis Date   Allergy    Asthma    Cataract    COPD (chronic obstructive pulmonary disease) (HCC)    Ejection fraction    Environmental allergies    GERD (gastroesophageal reflux disease)    Glaucoma    High cholesterol    Hypertension     Myocardial infarction [facility])    01-10-13- [facility],cardiology -yearly visits next 5'2016, denies any further problems   Pulmonary HTN (HCC)    a. Mod pulm HTN by cath 01/2013 (PASP ).10-30-14 tolerates walking 30 minutes contiuously x 3-4 times weekly, denies SOB or issues.   PVC's (premature ventricular contractions)    a. Noted during 01/2013 admission.   Seasonal allergies    Stroke [facility])    Slurred speech, unsteady gait 01-15-2013- no residual effects.   Takotsubo cardiomyopathy    a. NSTEMI 2/2 takotsubo 01/2013, no significant CAD by cath, EF 25%;  b. 01/2013 Echo: EF 25-30%, mod LVH, Sev HK in all segments except base. Mild/mod RV dysfxn, PASP .;  c.  Echo 07/2013: normal LVF with EF 60% and normal PAP   Uterine cancer (HCC)      Family History  Problem Relation Age of Onset   Heart attack Mother    Hypertension Mother    Dementia Father    Healthy Sister    Healthy Sister    Healthy Sister    Healthy Brother    Healthy Brother    Healthy Brother    Colon cancer Paternal Aunt    Esophageal cancer Neg Hx    Rectal cancer Neg Hx    Prostate cancer Neg Hx      Social History   Socioeconomic History   Marital status: Widowed    Spouse name: Not on file   Number of children: 3   Years of education: 12th   Highest education  level: Not on file  Occupational History   Occupation: clean building    Employer: [employer]  Tobacco Use   Smoking status: Never   Smokeless tobacco: Never  Vaping Use   Vaping status: Never Used  Substance and Sexual Activity   Alcohol use: No   Drug use: No   Sexual activity: Never  Other Topics Concern   Not on file  Social History Narrative   Lives in [place] with her son.  Her dtr also lives nearby.   Social Drivers of Corporate Investment Banker Strain: Not on file  Food Insecurity: Not on file  Transportation Needs: Not on file  Physical Activity: Not on file  Stress: Not on file  Social Connections: Not on file  Intimate  Partner Violence: Not on file     Allergies  Allergen Reactions   Guaifenesin  & Derivatives Swelling and Other (See Comments)    Arm swelling   Cetirizine Hcl Itching   Plavix  [Clopidogrel  Bisulfate] Itching   Xyzal [Cetirizine Hcl] Itching     Outpatient Medications Prior to Visit  Medication Sig Dispense Refill   albuterol  (PROVENTIL  HFA;VENTOLIN  HFA) 108 (90 Base) MCG/ACT inhaler Inhale 2 puffs into the lungs every 6 (six) hours as needed for wheezing or shortness of breath.     alendronate (FOSAMAX) 70 MG tablet Take 70 mg by mouth every 7 (seven) days. Take with a full glass of water on an empty stomach.     amLODipine  (NORVASC ) 2.5 MG tablet Take 1 tablet (2.5 mg total) by mouth daily. 180 tablet 3   ASPIRIN  LOW DOSE 81 MG EC tablet Take 81 mg by mouth daily.     atorvastatin  (LIPITOR) 40 MG tablet TAKE 1 TABLET BY MOUTH EVERY DAY AT 6 PM 90 tablet 1   carvedilol  (COREG ) 25 MG tablet TAKE 1 TABLET (25 MG TOTAL) BY MOUTH TWICE A DAY WITH MEALS 180 tablet 1   dorzolamide (TRUSOPT) 2 % ophthalmic solution Place 1 drop into the right eye 2 (two) times daily.     fluticasone  (FLONASE ) 50 MCG/ACT nasal spray Place into both nostrils daily.     latanoprost (XALATAN) 0.005 % ophthalmic solution 1 drop at bedtime.     losartan  (COZAAR ) 100 MG tablet TAKE 1 TABLET BY MOUTH EVERY DAY 90 tablet 3   montelukast  (SINGULAIR ) 10 MG tablet Take 10 mg by mouth at bedtime.     Spacer/Aero-Holding Chambers (AEROCHAMBER MV) inhaler as directed inhalation use with inhalers     SYMBICORT 160-4.5 MCG/ACT inhaler INHALE 2 PUFFS TWICE DAILY TO PREVENT COUGH OR WHEEZE. RINSE, GARGLE, AND SPIT AFTER USE. 1 Inhaler 0   No facility-administered medications prior to visit.    ROS Reviewed all systems and reported negative except as above     Objective:   Vitals:   06/14/24 1128  BP: 127/71  Pulse: 79  Temp: 98.6 F (37 C)  TempSrc: Oral  SpO2: 95%  Weight: 101 lb 9.6 oz (46.1 kg)  Height: 5'  (1.524 m)    Physical Exam Physical Exam MEASUREMENTS: Weight- 100. GENERAL: Appropriate to age, no acute distress. HEAD EYES EARS NOSE THROAT: Moist mucous membranes, atraumatic, normocephalic. CHEST: Crackles present in lungs. CARDIAC: Regular rate and rhythm, normal S1, normal S2, small heart murmur present, no rubs, no gallops. ABDOMEN: Soft, nontender. NEUROLOGICAL: Motor and sensation grossly intact, alert and oriented times X 3. EXTREMITIES: Warm, well perfused, no edema.     CBC    Component Value Date/Time  WBC 6.2 03/30/2024 1407   RBC 3.58 (L) 03/30/2024 1407   HGB 10.0 (L) 03/30/2024 1407   HCT 30.9 (L) 03/30/2024 1407   PLT 239.0 03/30/2024 1407   MCV 86.4 03/30/2024 1407   MCH 29.3 03/10/2023 0000   MCHC 32.5 03/30/2024 1407   RDW 15.1 03/30/2024 1407   LYMPHSABS 2.3 03/30/2024 1407   MONOABS 0.6 03/30/2024 1407   EOSABS 0.9 (H) 03/30/2024 1407   BASOSABS 0.0 03/30/2024 1407     Chest imaging: I reviewed her CT chest which shows diffuse bronchial wall thickening with motion artifact.  There is no infiltrates or consolidations.  There is no evidence of emphysema or air trapping.      Assessment & Plan:   Assessment and Plan Assessment & Plan Chronic productive cough with bronchial wall thickening Chronic cough with clear sputum and bronchial wall thickening on CT. Differential includes bronchitis and silent GERD and recurrent aspiration. - Repeat chest CT in three months to assess bronchial wall thickening. - Prescribed Tessalon  Perles for cough management. - Prescribed chlorpheniramine for nighttime cough. - Provided GERD diet instructions. - Prescribed flutter device for mucus clearance.  Asthma and allergic rhinitis Asthma managed with Symbicort and albuterol . Allergic rhinitis with sinus congestion managed with Flonase . - Continue Symbicort inhaler twice daily. - Use albuterol  as needed. - Use Flonase  daily.  Suspected silent  gastroesophageal reflux disease with hiatal hernia and dysphagia Suspected silent GERD contributing to cough. History of hiatal hernia and esophageal dilation. - Provided GERD diet instructions. - Recommended gastroenterologist follow-up.      Follow-up in the clinic in 3 months    Zola Herter, MD [facility] Office: [phone]

## 2024-06-14 NOTE — Patient Instructions (Signed)
  GERD diet: Foods to Avoid:  Acidic foods: Citrus fruits, tomatoes, tomato-based products, vinegar, garlic, onions  Fatty foods: Fried foods, fatty meats, whole milk, cheese  Spicy foods: Chili peppers, black pepper, mustard  Chocolate: Contains caffeine and theobromine, which relax the lower esophageal sphincter (LES)  Alcohol: Relaxes the LES and increases stomach acid production  Caffeine: Found in coffee, tea, and some sodas, it can stimulate stomach acid production    Foods to Eat:  Lean proteins: Chicken, fish, eggs Non-acidic fruits: Apples, bananas, pears, grapes Vegetables: Steamed, roasted, or boiled vegetables (e.g., broccoli, carrots, green beans) Whole grains: Brown rice, quinoa, oatmeal Low-fat dairy: Skim milk, low-fat yogurt, low-fat cheese Water: Staying hydrated helps dilute stomach acid    Other Dietary Recommendations: Eat smaller, more frequent meals. Avoid eating within 2-3 hours of bedtime. Chew food thoroughly. Limit sugary drinks and processed foods. Consider a Mediterranean-style diet, which is rich in fruits, vegetables, and whole grains.     VISIT SUMMARY: Today, we discussed your chronic cough and weight loss. We reviewed your history of asthma, acid reflux, and recent weight loss. We also discussed your current medications and any recent changes in your health.  YOUR PLAN: -CHRONIC PRODUCTIVE COUGH WITH BRONCHIAL WALL THICKENING: Your chronic cough with clear phlegm and bronchial wall thickening may be due to bronchitis or silent acid reflux. We will repeat your chest CT in three months to monitor this. You have been prescribed Tessalon  Perles to help manage your cough and chlorpheniramine for nighttime relief. Additionally, you should follow the GERD diet instructions provided and use the Aerobika device to help clear mucus.  -ASTHMA AND ALLERGIC RHINITIS: Your asthma and sinus congestion are being managed with your current medications. Continue using  your Symbicort inhaler twice daily, your albuterol  inhaler as needed, and Flonase  daily.  -SUSPECTED SILENT GASTROESOPHAGEAL REFLUX DISEASE (GERD) WITH HIATAL HERNIA AND DYSPHAGIA: Silent GERD may be contributing to your cough. You have a history of hiatal hernia and difficulty swallowing. Follow the GERD diet instructions provided and schedule a follow-up with a gastroenterologist.  -UNINTENTIONAL WEIGHT LOSS: You have lost 19 pounds over the past year, which may be related to your chronic cough and suspected silent GERD. We will address the underlying causes such as GERD and chronic cough to help manage this.  -HISTORY OF MYOCARDIAL INFARCTION WITH HEART MURMUR: You have a history of a heart attack and a heart murmur. We will continue to monitor your heart health.  INSTRUCTIONS: Please follow the GERD diet instructions provided and use the Aerobika device as directed. Schedule a follow-up appointment with a gastroenterologist. We will repeat your chest CT in three months to monitor your bronchial wall thickening. Continue using your Symbicort inhaler twice daily, albuterol  as needed, and Flonase  daily.

## 2024-06-16 ENCOUNTER — Other Ambulatory Visit: Payer: Self-pay

## 2024-06-16 ENCOUNTER — Telehealth: Payer: Self-pay | Admitting: *Deleted

## 2024-06-16 DIAGNOSIS — R053 Chronic cough: Secondary | ICD-10-CM

## 2024-06-16 MED ORDER — DEXTROMETHORPHAN HBR 15 MG/5ML PO SYRP: 5.0000 mL | ORAL_SOLUTION | Freq: Four times a day (QID) | ORAL | 0 refills | Status: AC | PRN

## 2024-06-16 NOTE — Telephone Encounter (Signed)
 Pt would like another cough med She is unable to take tessalon  perles. Asked about other meds on her list and she is able to cut those in half but not capsule.  Copied from CRM [id number]. Topic: Clinical - Prescription Issue >> Jun 15, 2024  1:36 PM Rozanna MATSU wrote: Reason for CRM: pt stated she can not take this med benzonatate  (TESSALON ) 200 MG capsule, please prescribe something else. Not able to swallow pill.

## 2024-06-16 NOTE — Telephone Encounter (Signed)
 Atc x1 lmtcb

## 2024-06-17 NOTE — Telephone Encounter (Signed)
 ATC X2. LMTCB. Sending pt a mychart message then completing note. NFN

## 2024-06-20 ENCOUNTER — Ambulatory Visit: Admission: EM | Admit: 2024-06-20 | Discharge: 2024-06-20 | Disposition: A | Discharge status: Home / Self Care

## 2024-06-20 ENCOUNTER — Ambulatory Visit: Admitting: Radiology

## 2024-06-20 ENCOUNTER — Encounter: Payer: Self-pay | Admitting: Emergency Medicine

## 2024-06-20 DIAGNOSIS — M25572 Pain in left ankle and joints of left foot: Secondary | ICD-10-CM | POA: Diagnosis not present

## 2024-06-20 DIAGNOSIS — S93402A Sprain of unspecified ligament of left ankle, initial encounter: Secondary | ICD-10-CM

## 2024-06-20 MED ORDER — DICLOFENAC SODIUM 50 MG PO TBEC: 50.0000 mg | DELAYED_RELEASE_TABLET | Freq: Two times a day (BID) | ORAL | 1 refills | Status: DC

## 2024-06-20 NOTE — Discharge Instructions (Signed)
  1. Sprain of left ankle, unspecified ligament, initial encounter (Primary) - DG Ankle Complete Left x-ray performed in UC shows no acute fracture or dislocation of the left ankle, injury most likely soft tissue inflammation - diclofenac (VOLTAREN) 50 MG EC tablet; Take 1 tablet (50 mg total) by mouth 2 (two) times daily.  Dispense: 30 tablet; Refill: 1 - AMB referral to orthopedics for further evaluation and management if symptoms persist despite current therapy  -Continue to monitor symptoms for any change in severity if there is any escalation of current symptoms or development of new symptoms follow-up in ER for further evaluation and management.

## 2024-06-20 NOTE — ED Triage Notes (Signed)
 Pt c/o left foot pain at inside of ankle since Saturday. Pain is worse when walking. Denies injury

## 2024-06-20 NOTE — ED Provider Notes (Signed)
 [facility]  Note:  This document was prepared using Dragon voice recognition software and may include unintentional dictation errors.  MRN: [id number] DOB: 03-Sep-1945  Subjective:   Deanna Brown is a 78 y.o. female presenting for evaluation of left foot/ankle pain since Saturday.  Patient denies any known injury or trauma.  Patient reports that she initially noticed symptoms on Saturday evening patient was up on her feet most of the day and then noticed some discomfort with ambulation Saturday evening.  Patient has not taken any over-the-counter pain reliever or anti-inflammatory medication prior to arrival in urgent care today.  Patient denies any previous injuries or trauma.  No current facility-administered medications for this encounter.  Current Outpatient Medications:    dextromethorphan 15 MG/5ML syrup, Take 5 mLs (15 mg total) by mouth 4 (four) times daily as needed for cough., Disp: 120 mL, Rfl: 0   diclofenac (VOLTAREN) 50 MG EC tablet, Take 1 tablet (50 mg total) by mouth 2 (two) times daily., Disp: 30 tablet, Rfl: 1   albuterol  (PROVENTIL  HFA;VENTOLIN  HFA) 108 (90 Base) MCG/ACT inhaler, Inhale 2 puffs into the lungs every 6 (six) hours as needed for wheezing or shortness of breath., Disp: , Rfl:    alendronate (FOSAMAX) 70 MG tablet, Take 70 mg by mouth every 7 (seven) days. Take with a full glass of water on an empty stomach., Disp: , Rfl:    amLODipine  (NORVASC ) 2.5 MG tablet, Take 1 tablet (2.5 mg total) by mouth daily., Disp: 180 tablet, Rfl: 3   ASPIRIN  LOW DOSE 81 MG EC tablet, Take 81 mg by mouth daily., Disp: , Rfl:    atorvastatin  (LIPITOR) 40 MG tablet, TAKE 1 TABLET BY MOUTH EVERY DAY AT 6 PM, Disp: 90 tablet, Rfl: 1   benzonatate  (TESSALON ) 200 MG capsule, Take 1 capsule (200 mg total) by mouth 3 (three) times daily as needed for cough., Disp: 30 capsule, Rfl: 1   carvedilol  (COREG ) 25 MG tablet, TAKE 1 TABLET (25 MG TOTAL) BY MOUTH TWICE A  DAY WITH MEALS, Disp: 180 tablet, Rfl: 1   dorzolamide (TRUSOPT) 2 % ophthalmic solution, Place 1 drop into the right eye 2 (two) times daily., Disp: , Rfl:    fluticasone  (FLONASE ) 50 MCG/ACT nasal spray, Place into both nostrils daily., Disp: , Rfl:    latanoprost (XALATAN) 0.005 % ophthalmic solution, 1 drop at bedtime., Disp: , Rfl:    losartan  (COZAAR ) 100 MG tablet, TAKE 1 TABLET BY MOUTH EVERY DAY, Disp: 90 tablet, Rfl: 3   montelukast  (SINGULAIR ) 10 MG tablet, Take 10 mg by mouth at bedtime., Disp: , Rfl:    Spacer/Aero-Holding Chambers (AEROCHAMBER MV) inhaler, as directed inhalation use with inhalers, Disp: , Rfl:    SYMBICORT 160-4.5 MCG/ACT inhaler, INHALE 2 PUFFS TWICE DAILY TO PREVENT COUGH OR WHEEZE. RINSE, GARGLE, AND SPIT AFTER USE., Disp: 1 Inhaler, Rfl: 0   Allergies  Allergen Reactions   Guaifenesin  & Derivatives Swelling and Other (See Comments)    Arm swelling   Cetirizine Hcl Itching   Plavix  [Clopidogrel  Bisulfate] Itching   Xyzal [Cetirizine Hcl] Itching    Past Medical History:  Diagnosis Date   Allergy    Asthma    Cataract    COPD (chronic obstructive pulmonary disease) (HCC)    Ejection fraction    Environmental allergies    GERD (gastroesophageal reflux disease)    Glaucoma    High cholesterol    Hypertension    Myocardial infarction (HCC)  01-10-13- [facility],cardiology -yearly visits next 5'2016, denies any further problems   Pulmonary HTN (HCC)    a. Mod pulm HTN by cath 01/2013 (PASP ).10-30-14 tolerates walking 30 minutes contiuously x 3-4 times weekly, denies SOB or issues.   PVC's (premature ventricular contractions)    a. Noted during 01/2013 admission.   Seasonal allergies    Stroke [facility])    Slurred speech, unsteady gait 01-15-2013- no residual effects.   Takotsubo cardiomyopathy    a. NSTEMI 2/2 takotsubo 01/2013, no significant CAD by cath, EF 25%;  b. 01/2013 Echo: EF 25-30%, mod LVH, Sev HK in all segments except base. Mild/mod RV  dysfxn, PASP .;  c.  Echo 07/2013: normal LVF with EF 60% and normal PAP   Uterine cancer (HCC)      Past Surgical History:  Procedure Laterality Date   ABDOMINAL HYSTERECTOMY  1999   BALLOON DILATION N/A 11/30/2014   Procedure: BALLOON DILATION;  Surgeon: Jerrell Sol, MD;  Location: [facility] ENDOSCOPY;  Service: Endoscopy;  Laterality: N/A;   ESOPHAGOGASTRODUODENOSCOPY (EGD) WITH PROPOFOL  N/A 11/01/2014   Procedure: ESOPHAGOGASTRODUODENOSCOPY (EGD) WITH PROPOFOL ;  Surgeon: Lynwood Bohr, MD;  Location: [facility] ENDOSCOPY;  Service: Endoscopy;  Laterality: N/A;   ESOPHAGOGASTRODUODENOSCOPY (EGD) WITH PROPOFOL  N/A 11/30/2014   Procedure: ESOPHAGOGASTRODUODENOSCOPY (EGD) WITH PROPOFOL ;  Surgeon: Jerrell Sol, MD;  Location: [facility] ENDOSCOPY;  Service: Endoscopy;  Laterality: N/A;   ETHMOIDECTOMY Bilateral 09/03/2020   Procedure: TOTAL ETHMOIDECTOMY;  Surgeon: Karis Clunes, MD;  Location: [facility];  Service: ENT;  Laterality: Bilateral;   FRONTAL SINUS EXPLORATION Bilateral 09/03/2020   Procedure: FRONTAL RECESS SINUS EXPLORATION;  Surgeon: Karis Clunes, MD;  Location: [facility];  Service: ENT;  Laterality: Bilateral;   LEFT AND RIGHT HEART CATHETERIZATION WITH CORONARY ANGIOGRAM N/A 01/11/2013   Procedure: LEFT AND RIGHT HEART CATHETERIZATION WITH CORONARY ANGIOGRAM;  Surgeon: Lynwood Schilling, MD;  Location: [facility] CATH LAB;  Service: Cardiovascular;  Laterality: N/A;   MAXILLARY ANTROSTOMY Bilateral 09/03/2020   Procedure: MAXILLARY ANTROSTOMY WITH TISSUE REMOVAL;  Surgeon: Karis Clunes, MD;  Location: [facility];  Service: ENT;  Laterality: Bilateral;   SAVORY DILATION N/A 11/01/2014   Procedure: SAVORY DILATION;  Surgeon: Lynwood Bohr, MD;  Location: [facility] ENDOSCOPY;  Service: Endoscopy;  Laterality: N/A;   SAVORY DILATION N/A 11/30/2014   Procedure: SAVORY DILATION;  Surgeon: Jerrell Sol, MD;  Location: [facility] ENDOSCOPY;  Service: Endoscopy;  Laterality: N/A;   SINUS  ENDO WITH FUSION Bilateral 09/03/2020   Procedure: SINUS ENDOSCOPY WITH FUSION NAVIGATION;  Surgeon: Karis Clunes, MD;  Location: [facility];  Service: ENT;  Laterality: Bilateral;   SPHENOIDECTOMY Bilateral 09/03/2020   Procedure: SPHENOIDECTOMY WITH TISSUE REMOVAL;  Surgeon: Karis Clunes, MD;  Location: [facility];  Service: ENT;  Laterality: Bilateral;   TURBINATE REDUCTION Bilateral 09/03/2020   Procedure: BILATERAL TURBINATE REDUCTION;  Surgeon: Karis Clunes, MD;  Location: [facility];  Service: ENT;  Laterality: Bilateral;    Family History  Problem Relation Age of Onset   Heart attack Mother    Hypertension Mother    Dementia Father    Healthy Sister    Healthy Sister    Healthy Sister    Healthy Brother    Healthy Brother    Healthy Brother    Colon cancer Paternal Aunt    Esophageal cancer Neg Hx    Rectal cancer Neg Hx    Prostate cancer Neg Hx     Social History  Tobacco Use   Smoking status: Never   Smokeless tobacco: Never  Vaping Use   Vaping status: Never Used  Substance Use Topics   Alcohol use: No   Drug use: No    ROS Refer to HPI for ROS details.  Objective:   Vitals: BP 127/75 (BP Location: Left Arm)   Pulse 69   Temp 97.9 F (36.6 C) (Oral)   Resp 17   SpO2 98%   Physical Exam Vitals and nursing note reviewed.  Constitutional:      General: She is not in acute distress.    Appearance: She is well-developed. She is not ill-appearing or toxic-appearing.  HENT:     Head: Normocephalic and atraumatic.  Cardiovascular:     Rate and Rhythm: Normal rate.  Pulmonary:     Effort: Pulmonary effort is normal. No respiratory distress.  Musculoskeletal:     Left ankle: Swelling and ecchymosis present. No deformity. Tenderness present. Normal range of motion.     Left Achilles Tendon: No tenderness or defects.  Skin:    General: Skin is warm and dry.  Neurological:     General: No focal deficit present.      Mental Status: She is alert and oriented to person, place, and time.  Psychiatric:        Mood and Affect: Mood normal.        Behavior: Behavior normal.     Procedures  No results found for this or any previous visit (from the past 24 hours).  DG Ankle Complete Left Result Date: 06/20/2024 CLINICAL DATA:  Left ankle pain. EXAM: LEFT ANKLE COMPLETE - 3+ VIEW COMPARISON:  None Available. FINDINGS: No acute osseous or joint abnormality.  Calcaneal spurs. IMPRESSION: No acute findings. Electronically Signed   By: Newell Eke M.D.   On: 06/20/2024 11:02     Assessment and Plan :     Discharge Instructions       1. Sprain of left ankle, unspecified ligament, initial encounter (Primary) - DG Ankle Complete Left x-ray performed in UC shows no acute fracture or dislocation of the left ankle, injury most likely soft tissue inflammation - diclofenac (VOLTAREN) 50 MG EC tablet; Take 1 tablet (50 mg total) by mouth 2 (two) times daily.  Dispense: 30 tablet; Refill: 1 - AMB referral to orthopedics for further evaluation and management if symptoms persist despite current therapy  -Continue to monitor symptoms for any change in severity if there is any escalation of current symptoms or development of new symptoms follow-up in ER for further evaluation and management.      Freeland Pracht B Samarrah Tranchina   Nyal Schachter, [place], [place] 06/20/24 1124

## 2024-06-21 ENCOUNTER — Telehealth: Payer: Self-pay

## 2024-06-21 NOTE — Telephone Encounter (Signed)
 [facility] called to verify the dosage concern. Per pharmacy staff at [facility], the cough medication that was ordered is not covered by insurance. Patient is able to buy dextromethorphan as an OTC medication.   Patient called to let her know the resolution. Discussed what patient needed to buy. Patient verbalized understanding and all questions answered.    Copied from CRM [id number]. Topic: Clinical - Medication Question >> Jun 21, 2024 12:25 PM Essie A wrote: Reason for CRM: Patient's daughter went to the pharmacy today to pick up prescription but the pharmacy did not fill it yet because they need the dosage for the medication.  The pharmacy needs for someone to call to give them the correct dosage.  Here's the pharmacy into: [id number] [place] [place], [place] - [address] [place] [address] Phone: [phone] Fax: [phone]  Thanks.

## 2024-06-24 ENCOUNTER — Other Ambulatory Visit: Payer: Self-pay | Admitting: Cardiology

## 2024-07-13 ENCOUNTER — Encounter (HOSPITAL_COMMUNITY): Payer: Self-pay | Admitting: Gastroenterology

## 2024-07-13 NOTE — Progress Notes (Signed)
 Deanna Brown   PCP-Avbuere Brown Cardiologist-Turner Brown PulmonologistGLENWOOD Herter Brown  EKG-04/07/24 Echo-04/28/23 Cath-n/a [name] GLP1-n/a  Blood Thinner-baby ASA  History: HTN,MI,Stroke, Asthma, COPD, Uterine CA, PVCs, Pulm HTN, Cardiomyopathy. Patient had EGD in office 9/30 and esophagus was stenosed too much to get scope down, so rescheduled at hospital with general. Patient last saw cardiology 04/07/24 with f/u in 1 year. Patient also sees pulm for a chronic cough with pheglm, last visit 11/11 and they ordered a ct to be done in feb. Per patient no new cardiac or breathing issues, no equipment.   Anesthesia Review- Yes

## 2024-07-14 ENCOUNTER — Telehealth: Payer: Self-pay

## 2024-07-14 NOTE — Telephone Encounter (Signed)
 Procedure:EGD Procedure date: 07/19/24 Procedure location: [facility] Arrival Time: 10:13 Spoke with the patient Y/N: N Any prep concerns? N  Has the patient obtained the prep from the pharmacy ? N Do you have a care partner and transportation: N Any additional concerns? N    I called patient on 3 different occasions and I left a detailed message about the procedure and the office number in case patient has questions are concerns.

## 2024-07-14 NOTE — Telephone Encounter (Signed)
 Noted pt was given all information at office visit and sent to My Chart

## 2024-07-19 ENCOUNTER — Ambulatory Visit (HOSPITAL_COMMUNITY)
Admission: RE | Admit: 2024-07-19 | Discharge: 2024-07-19 | Disposition: A | Source: Home / Self Care | Attending: Gastroenterology | Admitting: Gastroenterology

## 2024-07-19 ENCOUNTER — Ambulatory Visit (HOSPITAL_COMMUNITY): Admitting: Anesthesiology

## 2024-07-19 ENCOUNTER — Encounter (HOSPITAL_COMMUNITY): Admission: RE | Disposition: A | Payer: Self-pay | Source: Home / Self Care | Attending: Gastroenterology

## 2024-07-19 ENCOUNTER — Encounter (HOSPITAL_COMMUNITY): Payer: Self-pay | Admitting: Gastroenterology

## 2024-07-19 ENCOUNTER — Other Ambulatory Visit: Payer: Self-pay

## 2024-07-19 DIAGNOSIS — D649 Anemia, unspecified: Secondary | ICD-10-CM | POA: Insufficient documentation

## 2024-07-19 DIAGNOSIS — R634 Abnormal weight loss: Secondary | ICD-10-CM | POA: Diagnosis not present

## 2024-07-19 DIAGNOSIS — R053 Chronic cough: Secondary | ICD-10-CM | POA: Insufficient documentation

## 2024-07-19 DIAGNOSIS — K219 Gastro-esophageal reflux disease without esophagitis: Secondary | ICD-10-CM | POA: Diagnosis not present

## 2024-07-19 DIAGNOSIS — K222 Esophageal obstruction: Secondary | ICD-10-CM

## 2024-07-19 DIAGNOSIS — Z681 Body mass index (BMI) 19 or less, adult: Secondary | ICD-10-CM | POA: Insufficient documentation

## 2024-07-19 DIAGNOSIS — J9601 Acute respiratory failure with hypoxia: Secondary | ICD-10-CM | POA: Diagnosis not present

## 2024-07-19 DIAGNOSIS — K209 Esophagitis, unspecified without bleeding: Secondary | ICD-10-CM

## 2024-07-19 DIAGNOSIS — R1110 Vomiting, unspecified: Secondary | ICD-10-CM | POA: Diagnosis not present

## 2024-07-19 DIAGNOSIS — J4489 Other specified chronic obstructive pulmonary disease: Secondary | ICD-10-CM | POA: Diagnosis not present

## 2024-07-19 DIAGNOSIS — I10 Essential (primary) hypertension: Secondary | ICD-10-CM | POA: Diagnosis not present

## 2024-07-19 DIAGNOSIS — Z8673 Personal history of transient ischemic attack (TIA), and cerebral infarction without residual deficits: Secondary | ICD-10-CM | POA: Diagnosis not present

## 2024-07-19 DIAGNOSIS — J449 Chronic obstructive pulmonary disease, unspecified: Secondary | ICD-10-CM | POA: Diagnosis not present

## 2024-07-19 HISTORY — PX: ESOPHAGOGASTRODUODENOSCOPY: SHX5428

## 2024-07-19 SURGERY — EGD (ESOPHAGOGASTRODUODENOSCOPY)
Anesthesia: Monitor Anesthesia Care

## 2024-07-19 MED ORDER — SODIUM CHLORIDE 0.9 % IV SOLN: INTRAVENOUS | Status: DC | PRN

## 2024-07-19 MED ORDER — PROPOFOL 500 MG/50ML IV EMUL
INTRAVENOUS | Status: DC | PRN
  Administered 2024-07-19: 11:00:00 125 ug/kg/min via INTRAVENOUS

## 2024-07-19 MED ORDER — PROPOFOL 10 MG/ML IV BOLUS
INTRAVENOUS | Status: DC | PRN
  Administered 2024-07-19: 11:00:00 100 mg via INTRAVENOUS
  Administered 2024-07-19: 11:00:00 50 mg via INTRAVENOUS

## 2024-07-19 MED ORDER — ESOMEPRAZOLE MAGNESIUM 2.5 MG PO PACK: 20.0000 mg | PACK | Freq: Two times a day (BID) | ORAL | 3 refills | Status: AC

## 2024-07-19 NOTE — H&P (Signed)
  Gastroenterology History and Physical   Primary Care Physician:  Shelda Atlas, MD   Reason for Procedure:   Unintentional weight loss, anemia, GERD, chronic cough  Plan:    EGD  with possible intervention     HPI: Deanna Brown is a 78 y.o. female here for EGD  for unintentional weight loss, anemia, GERD, chronic cough.  Please refer to office visit note for additional details  The risks and benefits as well as alternatives of endoscopic procedure(s) have been discussed and reviewed. All questions answered. The patient agrees to proceed.    Past Medical History:  Diagnosis Date   Allergy    Asthma    Cataract    COPD (chronic obstructive pulmonary disease) (HCC)    Ejection fraction    Environmental allergies    GERD (gastroesophageal reflux disease)    Glaucoma    High cholesterol    Hypertension    Myocardial infarction [facility])    01-10-13- [facility],cardiology -yearly visits next 5'2016, denies any further problems   Pulmonary HTN (HCC)    a. Mod pulm HTN by cath 01/2013 (PASP ).10-30-14 tolerates walking 30 minutes contiuously x 3-4 times weekly, denies SOB or issues.   PVC's (premature ventricular contractions)    a. Noted during 01/2013 admission.   Seasonal allergies    Stroke [facility])    Slurred speech, unsteady gait 01-15-2013- no residual effects.   Takotsubo cardiomyopathy    a. NSTEMI 2/2 takotsubo 01/2013, no significant CAD by cath, EF 25%;  b. 01/2013 Echo: EF 25-30%, mod LVH, Sev HK in all segments except base. Mild/mod RV dysfxn, PASP .;  c.  Echo 07/2013: normal LVF with EF 60% and normal PAP   Uterine cancer (HCC)     Past Surgical History:  Procedure Laterality Date   ABDOMINAL HYSTERECTOMY  1999   BALLOON DILATION N/A 11/30/2014   Procedure: BALLOON DILATION;  Surgeon: Jerrell Sol, MD;  Location: [facility] ENDOSCOPY;  Service: Endoscopy;  Laterality: N/A;   ESOPHAGOGASTRODUODENOSCOPY (EGD) WITH PROPOFOL  N/A 11/01/2014   Procedure:  ESOPHAGOGASTRODUODENOSCOPY (EGD) WITH PROPOFOL ;  Surgeon: Lynwood Bohr, MD;  Location: [facility] ENDOSCOPY;  Service: Endoscopy;  Laterality: N/A;   ESOPHAGOGASTRODUODENOSCOPY (EGD) WITH PROPOFOL  N/A 11/30/2014   Procedure: ESOPHAGOGASTRODUODENOSCOPY (EGD) WITH PROPOFOL ;  Surgeon: Jerrell Sol, MD;  Location: [facility] ENDOSCOPY;  Service: Endoscopy;  Laterality: N/A;   ETHMOIDECTOMY Bilateral 09/03/2020   Procedure: TOTAL ETHMOIDECTOMY;  Surgeon: Karis Clunes, MD;  Location: [facility];  Service: ENT;  Laterality: Bilateral;   FRONTAL SINUS EXPLORATION Bilateral 09/03/2020   Procedure: FRONTAL RECESS SINUS EXPLORATION;  Surgeon: Karis Clunes, MD;  Location: [facility];  Service: ENT;  Laterality: Bilateral;   LEFT AND RIGHT HEART CATHETERIZATION WITH CORONARY ANGIOGRAM N/A 01/11/2013   Procedure: LEFT AND RIGHT HEART CATHETERIZATION WITH CORONARY ANGIOGRAM;  Surgeon: Lynwood Schilling, MD;  Location: [facility] CATH LAB;  Service: Cardiovascular;  Laterality: N/A;   MAXILLARY ANTROSTOMY Bilateral 09/03/2020   Procedure: MAXILLARY ANTROSTOMY WITH TISSUE REMOVAL;  Surgeon: Karis Clunes, MD;  Location: [facility];  Service: ENT;  Laterality: Bilateral;   SAVORY DILATION N/A 11/01/2014   Procedure: SAVORY DILATION;  Surgeon: Lynwood Bohr, MD;  Location: [facility] ENDOSCOPY;  Service: Endoscopy;  Laterality: N/A;   SAVORY DILATION N/A 11/30/2014   Procedure: SAVORY DILATION;  Surgeon: Jerrell Sol, MD;  Location: [facility] ENDOSCOPY;  Service: Endoscopy;  Laterality: N/A;   SINUS ENDO WITH FUSION Bilateral 09/03/2020   Procedure: SINUS ENDOSCOPY WITH FUSION NAVIGATION;  Surgeon: Karis,  Su, MD;  Location: [facility];  Service: ENT;  Laterality: Bilateral;   SPHENOIDECTOMY Bilateral 09/03/2020   Procedure: SPHENOIDECTOMY WITH TISSUE REMOVAL;  Surgeon: Karis Clunes, MD;  Location: [facility];  Service: ENT;  Laterality: Bilateral;   TURBINATE REDUCTION Bilateral 09/03/2020    Procedure: BILATERAL TURBINATE REDUCTION;  Surgeon: Karis Clunes, MD;  Location: [facility];  Service: ENT;  Laterality: Bilateral;    Prior to Admission medications  Medication Sig Start Date End Date Taking? Authorizing Provider  dextromethorphan  15 MG/5ML syrup Take 5 mLs (15 mg total) by mouth 4 (four) times daily as needed for cough. 06/16/24   Hattar, Zola SAILOR, MD  albuterol  (PROVENTIL  HFA;VENTOLIN  HFA) 108 (90 Base) MCG/ACT inhaler Inhale 2 puffs into the lungs every 6 (six) hours as needed for wheezing or shortness of breath.    [provider]  alendronate (FOSAMAX) 70 MG tablet Take 70 mg by mouth every 7 (seven) days. Take with a full glass of water on an empty stomach.    [provider]  amLODipine  (NORVASC ) 2.5 MG tablet Take 1 tablet (2.5 mg total) by mouth daily. 04/02/23 06/14/24  Shlomo Wilbert SAUNDERS, MD  ASPIRIN  LOW DOSE 81 MG EC tablet Take 81 mg by mouth daily. 09/14/20   [provider]  atorvastatin  (LIPITOR) 40 MG tablet TAKE 1 TABLET BY MOUTH EVERY DAY AT 6 PM 06/27/24   Shlomo Wilbert SAUNDERS, MD  benzonatate  (TESSALON ) 200 MG capsule Take 1 capsule (200 mg total) by mouth 3 (three) times daily as needed for cough. 06/14/24   Hattar, Zola SAILOR, MD  carvedilol  (COREG ) 25 MG tablet TAKE 1 TABLET (25 MG TOTAL) BY MOUTH TWICE A DAY WITH MEALS 01/01/24   Shlomo Wilbert SAUNDERS, MD  diclofenac  (VOLTAREN ) 50 MG EC tablet Take 1 tablet (50 mg total) by mouth 2 (two) times daily. 06/20/24   Reddick, Johnathan B, NP  dorzolamide (TRUSOPT) 2 % ophthalmic solution Place 1 drop into the right eye 2 (two) times daily.    [provider]  fluticasone  (FLONASE ) 50 MCG/ACT nasal spray Place into both nostrils daily.    [provider]  latanoprost (XALATAN) 0.005 % ophthalmic solution 1 drop at bedtime. 09/20/19   [provider]  losartan  (COZAAR ) 100 MG tablet TAKE 1 TABLET BY MOUTH EVERY DAY 04/26/24   Shlomo Wilbert SAUNDERS, MD  montelukast  (SINGULAIR ) 10  MG tablet Take 10 mg by mouth at bedtime.    [provider]  Spacer/Aero-Holding Chambers (AEROCHAMBER MV) inhaler as directed inhalation use with inhalers    [provider]  SYMBICORT 160-4.5 MCG/ACT inhaler INHALE 2 PUFFS TWICE DAILY TO PREVENT COUGH OR WHEEZE. RINSE, GARGLE, AND SPIT AFTER USE. 07/31/15   Asa Aloysius LABOR, MD    No current facility-administered medications for this encounter.    Allergies as of 05/06/2024 - Review Complete 05/03/2024  Allergen Reaction Noted   Guaifenesin  & derivatives Swelling and Other (See Comments) 11/28/2014   Cetirizine hcl Itching 06/28/2011   Plavix  [clopidogrel  bisulfate] Itching 02/26/2013   Xyzal [cetirizine hcl] Itching 02/26/2013    Family History  Problem Relation Age of Onset   Heart attack Mother    Hypertension Mother    Dementia Father    Healthy Sister    Healthy Sister    Healthy Sister    Healthy Brother    Healthy Brother    Healthy Brother    Colon cancer Paternal Aunt    Esophageal cancer Neg  Hx    Rectal cancer Neg Hx    Prostate cancer Neg Hx     Social History   Socioeconomic History   Marital status: Widowed    Spouse name: Not on file   Number of children: 3   Years of education: 12th   Highest education level: Not on file  Occupational History   Occupation: clean building    Employer: [employer]  Tobacco Use   Smoking status: Never   Smokeless tobacco: Never  Vaping Use   Vaping status: Never Used  Substance and Sexual Activity   Alcohol use: No   Drug use: No   Sexual activity: Never  Other Topics Concern   Not on file  Social History Narrative   Lives in [place] with her son.  Her dtr also lives nearby.   Social Drivers of Health   Tobacco Use: Low Risk (07/19/2024)   Patient History    Smoking Tobacco Use: Never    Smokeless Tobacco Use: Never    Passive Exposure: Not on file  Financial Resource Strain: Not on file  Food Insecurity: Not on file  Transportation  Needs: Not on file  Physical Activity: Not on file  Stress: Not on file  Social Connections: Not on file  Intimate Partner Violence: Not on file  Depression (EYV7-0): Not on file  Alcohol Screen: Not on file  Housing: Not on file  Utilities: Not on file  Health Literacy: Not on file    Review of Systems:  All other review of systems negative except as mentioned in the HPI.  Physical Exam: Vital signs in last 24 hours: Temp:  [98.1 F (36.7 C)] 98.1 F (36.7 C) (12/16 1051) Pulse Rate:  [65] 65 (12/16 1051) Resp:  [10] 10 (12/16 1051) BP: (146)/(80) 146/80 (12/16 1051) SpO2:  [100 %] 100 % (12/16 1051) Weight:  [46 kg] 46 kg (12/16 1051)   General:   Alert, NAD Lungs:  Clear .   Heart:  Regular rate and rhythm Abdomen:  Soft, nontender and nondistended. Neuro/Psych:  Alert and cooperative. Normal mood and affect. A and O x 3   K. Veena Jurline Folger , MD [phone]

## 2024-07-19 NOTE — Anesthesia Preprocedure Evaluation (Signed)
 Anesthesia Evaluation  Patient identified by MRN, date of birth, ID band Patient awake    Reviewed: Allergy & Precautions, H&P , NPO status , Patient's Chart, lab work & pertinent test results  Airway Mallampati: II   Neck ROM: full    Dental   Pulmonary asthma , COPD   breath sounds clear to auscultation       Cardiovascular hypertension,  Rhythm:regular Rate:Normal  H/o Takotsubo's cardiomyopathy 2014.  Fully recovered.   Neuro/Psych CVA    GI/Hepatic ,GERD  ,,  Endo/Other    Renal/GU      Musculoskeletal   Abdominal   Peds  Hematology   Anesthesia Other Findings   Reproductive/Obstetrics                              Anesthesia Physical Anesthesia Plan  ASA: 3  Anesthesia Plan: MAC   Post-op Pain Management:    Induction: Intravenous  PONV Risk Score and Plan: 2 and Propofol  infusion and Treatment may vary due to age or medical condition  Airway Management Planned: Nasal Cannula  Additional Equipment:   Intra-op Plan:   Post-operative Plan:   Informed Consent: I have reviewed the patients History and Physical, chart, labs and discussed the procedure including the risks, benefits and alternatives for the proposed anesthesia with the patient or authorized representative who has indicated his/her understanding and acceptance.     Dental advisory given  Plan Discussed with: CRNA, Anesthesiologist and Surgeon  Anesthesia Plan Comments:         Anesthesia Quick Evaluation

## 2024-07-19 NOTE — Transfer of Care (Signed)
 Immediate Anesthesia Transfer of Care Note  Patient: Deanna Brown  Procedure(s) Performed: EGD (ESOPHAGOGASTRODUODENOSCOPY)  Patient Location: Endoscopy Unit  Anesthesia Type:MAC  Level of Consciousness: drowsy and patient cooperative  Airway & Oxygen  Therapy: Patient Spontanous Breathing and Patient connected to face mask oxygen   Post-op Assessment: Report given to RN and Post -op Vital signs reviewed and stable  Post vital signs: Reviewed and stable  Last Vitals:  Vitals Value Taken Time  BP    Temp    Pulse 59 07/19/24 11:43  Resp 17 07/19/24 11:43  SpO2 100 % 07/19/24 11:43  Vitals shown include unfiled device data.  Last Pain:  Vitals:   07/19/24 1051  TempSrc: Oral  PainSc: 0-No pain         Complications: No notable events documented.

## 2024-07-19 NOTE — Discharge Instructions (Signed)

## 2024-07-19 NOTE — Op Note (Addendum)
 [facility] Patient Name: Deanna Brown Procedure Date: 07/19/2024 MRN: [id number] Attending MD: Gustav ALONSO Mcgee , MD, [id number] Date of Birth: 01-Mar-1946 CSN: [id number] Age: 78 Admit Type: Ambulatory Procedure:                Upper GI endoscopy Indications:              Suspected gastro-esophageal reflux disease, Chronic                            cough, Regurgitation, Weight loss Providers:                Gustav ALONSO Mcgee, MD, Darleene Bare, RN, Fairy Marina, Technician Referring MD:              Medicines:                Monitored Anesthesia Care Complications:            No immediate complications. Estimated Blood Loss:     Estimated blood loss was minimal. Procedure:                Pre-Anesthesia Assessment:                           - Prior to the procedure, a History and Physical                            was performed, and patient medications and                            allergies were reviewed. The patient's tolerance of                            previous anesthesia was also reviewed. The risks                            and benefits of the procedure and the sedation                            options and risks were discussed with the patient.                            All questions were answered, and informed consent                            was obtained. Prior Anticoagulants: The patient has                            taken no anticoagulant or antiplatelet agents. ASA                            Grade Assessment: III - A patient with severe  systemic disease. After reviewing the risks and                            benefits, the patient was deemed in satisfactory                            condition to undergo the procedure.                           After obtaining informed consent, the endoscope was                            passed under direct vision. Throughout the                             procedure, the patient's blood pressure, pulse, and                            oxygen  saturations were monitored continuously. The                            [id number]-[id number] ([id number]) Olympus endoscope was introduced                            through the mouth, with the intention of advancing                            to the duodenum. The scope was advanced to the                            upper third of the esophagus before the procedure                            was aborted. Medications were given. The [id number]-[id number]                            ([id number]) Olympus endoscope was introduced through                            the mouth, with the intention of advancing to the                            duodenum. The scope was advanced to the lower third                            of the esophagus before the procedure was aborted.                            Medications were given. The upper GI endoscopy was                            technically difficult and complex due to stenosis.  The patient tolerated the procedure well. Scope In: Scope Out: Findings:      Multiple benign-appearing, intrinsic severe (stenosis; an endoscope       cannot pass) stenoses starting from 25cm, cannot insert regular EGD       scope switched to 5mm ultra slim EGD, able to advance to 34 cm from the       incisors. The narrowest stenosis measured 4 mm (inner diameter) at 34       cm, cannot advance scope any further. [Traversed]. Biopsies were       obtained from the proximal and distal esophagus with cold forceps for       histology of suspected eosinophilic esophagitis. Impression:               - Benign-appearing multiple severe esophageal                            stenoses.                           - Biopsies were taken with a cold forceps for                            evaluation of eosinophilic esophagitis. Moderate Sedation:      N/A Recommendation:           - Full liquid  diet.                           - Continue present medications.                           - Await pathology results.                           - Use Esomeprazole  20 mg PO powder packet BID.                           - Refer to [facility] for management of severe                            esophageal stricture Procedure Code(s):        --- Professional ---                           [phone], 52, Esophagogastroduodenoscopy, flexible,                            transoral; with biopsy, single or multiple Diagnosis Code(s):        --- Professional ---                           K22.2, Esophageal obstruction                           R05.3, Chronic cough                           R11.10, Vomiting, unspecified  R63.4, Abnormal weight loss CPT copyright 2022 American Medical Association. All rights reserved. The codes documented in this report are preliminary and upon coder review may  be revised to meet current compliance requirements. Makylie Rivere V. Naya Ilagan, MD 07/19/2024 11:47:24 AM This report has been signed electronically. Number of Addenda: 0

## 2024-07-20 LAB — SURGICAL PATHOLOGY

## 2024-07-20 NOTE — Anesthesia Postprocedure Evaluation (Signed)
 Anesthesia Post Note  Patient: Deanna Brown  Procedure(s) Performed: EGD (ESOPHAGOGASTRODUODENOSCOPY)     Patient location during evaluation: Endoscopy Anesthesia Type: MAC Level of consciousness: awake and alert Pain management: pain level controlled Vital Signs Assessment: post-procedure vital signs reviewed and stable Respiratory status: spontaneous breathing, nonlabored ventilation, respiratory function stable and patient connected to nasal cannula oxygen  Cardiovascular status: stable and blood pressure returned to baseline Postop Assessment: no apparent nausea or vomiting Anesthetic complications: no   No notable events documented.  Last Vitals:  Vitals:   07/19/24 1207 07/19/24 1210  BP: (!) 153/78 (!) 145/72  Pulse: 69 69  Resp: 14 (!) 29  Temp:    SpO2: 100% 98%    Last Pain:  Vitals:   07/19/24 1210  TempSrc:   PainSc: 0-No pain                 Trinitie Mcgirr S

## 2024-07-21 ENCOUNTER — Encounter (HOSPITAL_COMMUNITY): Payer: Self-pay | Admitting: Gastroenterology

## 2024-09-14 ENCOUNTER — Other Ambulatory Visit

## 2024-09-26 ENCOUNTER — Ambulatory Visit
